# Patient Record
Sex: Female | Born: 1935 | Race: Black or African American | Hispanic: No | Smoking: Never smoker
Health system: Southern US, Community
[De-identification: ages and names within clinical notes are randomized; demographics above are authoritative.]

## PROBLEM LIST (undated history)

## (undated) DIAGNOSIS — E7800 Pure hypercholesterolemia, unspecified: Secondary | ICD-10-CM

## (undated) DIAGNOSIS — I10 Essential (primary) hypertension: Secondary | ICD-10-CM

## (undated) DIAGNOSIS — E119 Type 2 diabetes mellitus without complications: Secondary | ICD-10-CM

## (undated) DIAGNOSIS — I639 Cerebral infarction, unspecified: Secondary | ICD-10-CM

## (undated) HISTORY — DX: Type 2 diabetes mellitus without complications: E11.9

## (undated) HISTORY — PX: OTHER SURGICAL HISTORY: SHX169

## (undated) HISTORY — PX: ABDOMINAL HYSTERECTOMY: SHX81

## (undated) HISTORY — DX: Pure hypercholesterolemia, unspecified: E78.00

## (undated) HISTORY — DX: Cerebral infarction, unspecified: I63.9

## (undated) HISTORY — DX: Essential (primary) hypertension: I10

---

## 2000-03-08 ENCOUNTER — Emergency Department (HOSPITAL_COMMUNITY): Admission: EM | Admit: 2000-03-08 | Discharge: 2000-03-08 | Payer: Self-pay | Admitting: Emergency Medicine

## 2000-06-24 ENCOUNTER — Encounter: Admission: RE | Admit: 2000-06-24 | Discharge: 2000-06-24 | Payer: Self-pay | Admitting: Emergency Medicine

## 2000-06-24 ENCOUNTER — Encounter: Payer: Self-pay | Admitting: Emergency Medicine

## 2000-06-26 ENCOUNTER — Encounter: Admission: RE | Admit: 2000-06-26 | Discharge: 2000-06-26 | Payer: Self-pay | Admitting: Emergency Medicine

## 2000-06-26 ENCOUNTER — Encounter: Payer: Self-pay | Admitting: Emergency Medicine

## 2000-06-26 ENCOUNTER — Encounter (INDEPENDENT_AMBULATORY_CARE_PROVIDER_SITE_OTHER): Payer: Self-pay | Admitting: Specialist

## 2000-06-26 ENCOUNTER — Other Ambulatory Visit: Admission: RE | Admit: 2000-06-26 | Discharge: 2000-06-26 | Payer: Self-pay | Admitting: Emergency Medicine

## 2000-07-08 ENCOUNTER — Encounter: Payer: Self-pay | Admitting: General Surgery

## 2000-07-15 ENCOUNTER — Observation Stay (HOSPITAL_COMMUNITY): Admission: RE | Admit: 2000-07-15 | Discharge: 2000-07-16 | Payer: Self-pay | Admitting: General Surgery

## 2000-07-15 ENCOUNTER — Encounter (INDEPENDENT_AMBULATORY_CARE_PROVIDER_SITE_OTHER): Payer: Self-pay | Admitting: Specialist

## 2000-10-20 ENCOUNTER — Encounter: Admission: RE | Admit: 2000-10-20 | Discharge: 2000-10-20 | Payer: Self-pay | Admitting: Emergency Medicine

## 2000-10-20 ENCOUNTER — Encounter: Payer: Self-pay | Admitting: Emergency Medicine

## 2001-01-12 ENCOUNTER — Encounter: Payer: Self-pay | Admitting: Emergency Medicine

## 2001-01-12 ENCOUNTER — Encounter: Admission: RE | Admit: 2001-01-12 | Discharge: 2001-01-12 | Payer: Self-pay | Admitting: Emergency Medicine

## 2001-06-28 ENCOUNTER — Encounter: Admission: RE | Admit: 2001-06-28 | Discharge: 2001-06-28 | Payer: Self-pay | Admitting: Emergency Medicine

## 2001-06-28 ENCOUNTER — Encounter: Payer: Self-pay | Admitting: Emergency Medicine

## 2002-08-02 ENCOUNTER — Encounter: Payer: Self-pay | Admitting: Emergency Medicine

## 2002-08-02 ENCOUNTER — Encounter: Admission: RE | Admit: 2002-08-02 | Discharge: 2002-08-02 | Payer: Self-pay | Admitting: Emergency Medicine

## 2002-12-13 ENCOUNTER — Encounter: Admission: RE | Admit: 2002-12-13 | Discharge: 2002-12-13 | Payer: Self-pay | Admitting: Emergency Medicine

## 2002-12-13 ENCOUNTER — Encounter: Payer: Self-pay | Admitting: Emergency Medicine

## 2004-05-02 ENCOUNTER — Encounter: Admission: RE | Admit: 2004-05-02 | Discharge: 2004-05-02 | Payer: Self-pay | Admitting: Emergency Medicine

## 2004-08-15 ENCOUNTER — Encounter: Admission: RE | Admit: 2004-08-15 | Discharge: 2004-08-15 | Payer: Self-pay | Admitting: Emergency Medicine

## 2004-09-19 ENCOUNTER — Encounter: Admission: RE | Admit: 2004-09-19 | Discharge: 2004-10-16 | Payer: Self-pay | Admitting: Orthopedic Surgery

## 2005-04-03 ENCOUNTER — Ambulatory Visit: Payer: Self-pay | Admitting: Internal Medicine

## 2005-06-20 ENCOUNTER — Encounter: Admission: RE | Admit: 2005-06-20 | Discharge: 2005-06-20 | Payer: Self-pay | Admitting: Emergency Medicine

## 2006-07-13 ENCOUNTER — Encounter (INDEPENDENT_AMBULATORY_CARE_PROVIDER_SITE_OTHER): Payer: Self-pay | Admitting: Specialist

## 2006-07-13 ENCOUNTER — Ambulatory Visit (HOSPITAL_COMMUNITY): Admission: RE | Admit: 2006-07-13 | Discharge: 2006-07-13 | Payer: Self-pay | Admitting: Gastroenterology

## 2006-09-25 ENCOUNTER — Encounter: Admission: RE | Admit: 2006-09-25 | Discharge: 2006-09-25 | Payer: Self-pay | Admitting: Emergency Medicine

## 2007-11-04 ENCOUNTER — Encounter: Admission: RE | Admit: 2007-11-04 | Discharge: 2007-11-04 | Payer: Self-pay | Admitting: Internal Medicine

## 2010-10-22 ENCOUNTER — Other Ambulatory Visit: Payer: Self-pay | Admitting: Internal Medicine

## 2010-10-22 DIAGNOSIS — R1013 Epigastric pain: Secondary | ICD-10-CM

## 2010-10-22 DIAGNOSIS — R1011 Right upper quadrant pain: Secondary | ICD-10-CM

## 2010-10-24 ENCOUNTER — Ambulatory Visit
Admission: RE | Admit: 2010-10-24 | Discharge: 2010-10-24 | Disposition: A | Discharge status: Home / Self Care | Payer: Medicare PPO | Source: Ambulatory Visit | Attending: Internal Medicine | Admitting: Internal Medicine

## 2010-10-24 DIAGNOSIS — R1013 Epigastric pain: Secondary | ICD-10-CM

## 2010-10-24 DIAGNOSIS — R1011 Right upper quadrant pain: Secondary | ICD-10-CM

## 2010-11-01 NOTE — Op Note (Signed)
NAMEJACIANA, CLEMON            ACCOUNT NO.:  [id number]   MEDICAL RECORD NO.:  [id number]          PATIENT TYPE:  AMB   LOCATION:  ENDO                         FACILITY:  [facility]   PHYSICIAN:  Nelwyn Salisbury, M.D.  DATE OF BIRTH:  1931/09/03   DATE OF PROCEDURE:  07/13/2006  DATE OF DISCHARGE:                               OPERATIVE REPORT   PROCEDURE PERFORMED:  Colonoscopy with cold biopsies x2.   ENDOSCOPIST:  Nelwyn Salisbury, M.D.   INSTRUMENT USED:  Pentax video colonoscope.   INDICATIONS FOR PROCEDURE:  A 77 year old African-American female with  personal history of breast cancer diagnosed and treated in 2004 and a  history of rectal bleeding, undergoing colonoscopy to rule out colonic  polyps, masses, etc.   PREPROCEDURE PREPARATION:  Informed consent was procured from the  patient.  The patient was fasted for four hours prior to the procedure  and prepped with 20 Osmoprep pills the night of and 12 Osmoprep pills  the morning of the procedure.  The risks and benefits of the procedure  including a 10% miss rate for cancer or polyps was discussed with the  patient as well.   PREPROCEDURE PHYSICAL:  The patient had stable vital signs.  Neck  supple.  Chest clear to auscultation.  S1 and S2 regular.  Abdomen soft  with normal bowel sounds.   DESCRIPTION OF PROCEDURE:  The patient was placed in left lateral  decubitus position and sedated with 75 mcg of fentanyl and 7.5 mg of  Versed in slow incremental doses. Once the patient was adequately  sedated and maintained on low flow oxygen and continuous cardiac  monitoring, the Olympus video colonoscope was advanced from the rectum  to the cecum.  The appendicular orifice and ileocecal valve were clearly  visualized and photographed.  Multiple washes were done. A small sessile  polyp was biopsied from the cecum and one from the midright colon (cold  biopsies x2).  The appendicular orifice and ileocecal valve were clearly  visualized and photographed.  The terminal ileum appeared normal and  without lesions.  Retroflexion in the rectum revealed prominent internal  hemorrhoids.  The patient tolerated the procedure well without immediate  complications.   IMPRESSION:  1. Small sessile polyp biopsied from the cecum and the right colon.  2. Prominent internal hemorrhoids.  3. No evidence of diverticulosis.  4. Normal terminal ileum.   RECOMMENDATIONS:  1. Await pathology results.  2. Repeat colonoscopy depending on pathology results.  3. Avoid all nonsteroidals including aspirin for the next two weeks.  4. Outpatient followup as need arises in the future.      Nelwyn Salisbury, M.D.  Electronically Signed     JNM/MEDQ  D:  07/13/2006  T:  07/13/2006  Job:  [id number]   cc:   Harden Mo, M.D.  Rudell Cobb. Annamaria Boots, M.D.

## 2010-11-01 NOTE — Op Note (Signed)
[facility]  Patient:    Shannon Mcconnell, Shannon Mcconnell                     MRN: [id number] Proc. Date: 07/15/00 Adm. Date:  [id number] Attending:  Earl Gala CC:         Harden Mo, M.D.   Operative Report  PREOPERATIVE DIAGNOSIS:  Intraductal carcinoma of the left breast.  POSTOPERATIVE DIAGNOSIS:  Intraductal carcinoma of the left breast.  OPERATION PERFORMED:  Left total mastectomy.  SURGEON:  Dr. Marylene Buerger.  ASSISTANT:  Dr. Magdalene River.  ANESTHESIA:  General.  DESCRIPTION OF PROCEDURE:  This 77 year old female had an abnormal mammogram with microcalcifications occupying the inferior portion of her left breast. 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 She was not interested in reconstruction.  After suitable general anesthesia was induced, the patient was placed in the supine position with the left arm extended down the arm board. The left breast was then prepped and draped in the usual fashion. I marked the medial and the lateral portions of the breast and then by pulling the breast down made a straight line between these points and then by pulling the breast up made a straight line between the points which would give [place] our incisions and not leave dog ears.  Both incisions were then made and we dissected the superior flap, using the cautery, to the clavicle superiorly and then to the sternum medially and to the costocartilage inferiorly near at the abdominal muscles and then laterally to the latissimus dorsi muscle. Following the completion of the flap dissection with good hemostasis, we then removed the breast by dissecting from medial to lateral and removed it from the specimen. This gave [place] a total mastectomy without removing any of the axilla. Hemostasis was obtained with the cautery. We thoroughly irrigated the  field with saline. The 6 [place] Blake drain was inserted and brought out through a separate stab wound. The skin was stapled. Dressings applied. The patient was transferred to the recovery room in satisfactory condition having tolerated the procedure well. DD:  07/15/00 TD:  07/15/00 Job: [id number]

## 2011-02-08 ENCOUNTER — Ambulatory Visit (INDEPENDENT_AMBULATORY_CARE_PROVIDER_SITE_OTHER): Payer: Medicare PPO

## 2011-02-08 ENCOUNTER — Inpatient Hospital Stay (INDEPENDENT_AMBULATORY_CARE_PROVIDER_SITE_OTHER)
Admission: RE | Admit: 2011-02-08 | Discharge: 2011-02-08 | Disposition: A | Discharge status: Home / Self Care | Payer: Medicare PPO | Source: Ambulatory Visit | Attending: Emergency Medicine | Admitting: Emergency Medicine

## 2011-02-08 DIAGNOSIS — M25469 Effusion, unspecified knee: Secondary | ICD-10-CM

## 2011-02-08 LAB — SYNOVIAL CELL COUNT + DIFF, W/ CRYSTALS
Crystals, Fluid: NONE SEEN
Monocyte-Macrophage-Synovial Fluid: 8 % — ABNORMAL LOW (ref 50–90)
WBC, Synovial: 46915 /mm3 — ABNORMAL HIGH (ref 0–200)

## 2011-02-12 LAB — BODY FLUID CULTURE: Culture: NO GROWTH

## 2011-10-03 ENCOUNTER — Other Ambulatory Visit: Payer: Self-pay | Admitting: Internal Medicine

## 2011-10-03 DIAGNOSIS — Z1231 Encounter for screening mammogram for malignant neoplasm of breast: Secondary | ICD-10-CM

## 2011-10-03 DIAGNOSIS — Z9012 Acquired absence of left breast and nipple: Secondary | ICD-10-CM

## 2011-10-14 ENCOUNTER — Ambulatory Visit
Admission: RE | Admit: 2011-10-14 | Discharge: 2011-10-14 | Disposition: A | Discharge status: Home / Self Care | Payer: Medicare PPO | Source: Ambulatory Visit | Attending: Internal Medicine | Admitting: Internal Medicine

## 2011-10-14 DIAGNOSIS — Z1231 Encounter for screening mammogram for malignant neoplasm of breast: Secondary | ICD-10-CM

## 2011-10-14 DIAGNOSIS — Z9012 Acquired absence of left breast and nipple: Secondary | ICD-10-CM

## 2013-03-01 ENCOUNTER — Other Ambulatory Visit: Payer: Self-pay

## 2013-03-01 DIAGNOSIS — Z9012 Acquired absence of left breast and nipple: Secondary | ICD-10-CM

## 2013-03-01 DIAGNOSIS — Z1231 Encounter for screening mammogram for malignant neoplasm of breast: Secondary | ICD-10-CM

## 2013-03-24 ENCOUNTER — Ambulatory Visit
Admission: RE | Admit: 2013-03-24 | Discharge: 2013-03-24 | Disposition: A | Discharge status: Home / Self Care | Payer: Medicare PPO | Source: Ambulatory Visit

## 2013-03-24 DIAGNOSIS — Z9012 Acquired absence of left breast and nipple: Secondary | ICD-10-CM

## 2013-03-24 DIAGNOSIS — Z1231 Encounter for screening mammogram for malignant neoplasm of breast: Secondary | ICD-10-CM

## 2014-11-23 DIAGNOSIS — Z0000 Encounter for general adult medical examination without abnormal findings: Secondary | ICD-10-CM | POA: Diagnosis not present

## 2014-11-23 DIAGNOSIS — I130 Hypertensive heart and chronic kidney disease with heart failure and stage 1 through stage 4 chronic kidney disease, or unspecified chronic kidney disease: Secondary | ICD-10-CM | POA: Diagnosis not present

## 2014-11-23 DIAGNOSIS — R413 Other amnesia: Secondary | ICD-10-CM | POA: Diagnosis not present

## 2014-11-23 DIAGNOSIS — E1122 Type 2 diabetes mellitus with diabetic chronic kidney disease: Secondary | ICD-10-CM | POA: Diagnosis not present

## 2014-11-23 DIAGNOSIS — N08 Glomerular disorders in diseases classified elsewhere: Secondary | ICD-10-CM | POA: Diagnosis not present

## 2014-11-23 DIAGNOSIS — N183 Chronic kidney disease, stage 3 (moderate): Secondary | ICD-10-CM | POA: Diagnosis not present

## 2014-12-02 DIAGNOSIS — H04123 Dry eye syndrome of bilateral lacrimal glands: Secondary | ICD-10-CM | POA: Diagnosis not present

## 2014-12-02 DIAGNOSIS — H40033 Anatomical narrow angle, bilateral: Secondary | ICD-10-CM | POA: Diagnosis not present

## 2014-12-05 DIAGNOSIS — I270 Primary pulmonary hypertension: Secondary | ICD-10-CM | POA: Diagnosis not present

## 2014-12-27 DIAGNOSIS — M05741 Rheumatoid arthritis with rheumatoid factor of right hand without organ or systems involvement: Secondary | ICD-10-CM | POA: Diagnosis not present

## 2014-12-27 DIAGNOSIS — R9431 Abnormal electrocardiogram [ECG] [EKG]: Secondary | ICD-10-CM | POA: Diagnosis not present

## 2014-12-27 DIAGNOSIS — I270 Primary pulmonary hypertension: Secondary | ICD-10-CM | POA: Diagnosis not present

## 2014-12-27 DIAGNOSIS — M05742 Rheumatoid arthritis with rheumatoid factor of left hand without organ or systems involvement: Secondary | ICD-10-CM | POA: Diagnosis not present

## 2015-04-05 DIAGNOSIS — N951 Menopausal and female climacteric states: Secondary | ICD-10-CM | POA: Diagnosis not present

## 2015-04-05 DIAGNOSIS — Z23 Encounter for immunization: Secondary | ICD-10-CM | POA: Diagnosis not present

## 2015-04-05 DIAGNOSIS — Z13820 Encounter for screening for osteoporosis: Secondary | ICD-10-CM | POA: Diagnosis not present

## 2015-04-05 DIAGNOSIS — E1122 Type 2 diabetes mellitus with diabetic chronic kidney disease: Secondary | ICD-10-CM | POA: Diagnosis not present

## 2015-04-05 DIAGNOSIS — N08 Glomerular disorders in diseases classified elsewhere: Secondary | ICD-10-CM | POA: Diagnosis not present

## 2015-04-05 DIAGNOSIS — I129 Hypertensive chronic kidney disease with stage 1 through stage 4 chronic kidney disease, or unspecified chronic kidney disease: Secondary | ICD-10-CM | POA: Diagnosis not present

## 2015-04-05 DIAGNOSIS — N183 Chronic kidney disease, stage 3 (moderate): Secondary | ICD-10-CM | POA: Diagnosis not present

## 2015-08-09 DIAGNOSIS — N08 Glomerular disorders in diseases classified elsewhere: Secondary | ICD-10-CM | POA: Diagnosis not present

## 2015-08-09 DIAGNOSIS — I1310 Hypertensive heart and chronic kidney disease without heart failure, with stage 1 through stage 4 chronic kidney disease, or unspecified chronic kidney disease: Secondary | ICD-10-CM | POA: Diagnosis not present

## 2015-08-09 DIAGNOSIS — N390 Urinary tract infection, site not specified: Secondary | ICD-10-CM | POA: Diagnosis not present

## 2015-08-09 DIAGNOSIS — E1122 Type 2 diabetes mellitus with diabetic chronic kidney disease: Secondary | ICD-10-CM | POA: Diagnosis not present

## 2015-08-09 DIAGNOSIS — I5032 Chronic diastolic (congestive) heart failure: Secondary | ICD-10-CM | POA: Diagnosis not present

## 2015-08-09 DIAGNOSIS — N183 Chronic kidney disease, stage 3 (moderate): Secondary | ICD-10-CM | POA: Diagnosis not present

## 2015-10-03 DIAGNOSIS — M25561 Pain in right knee: Secondary | ICD-10-CM | POA: Diagnosis not present

## 2015-10-03 DIAGNOSIS — M1711 Unilateral primary osteoarthritis, right knee: Secondary | ICD-10-CM | POA: Diagnosis not present

## 2015-10-22 DIAGNOSIS — M1711 Unilateral primary osteoarthritis, right knee: Secondary | ICD-10-CM | POA: Diagnosis not present

## 2015-10-22 DIAGNOSIS — M25461 Effusion, right knee: Secondary | ICD-10-CM | POA: Diagnosis not present

## 2015-10-24 DIAGNOSIS — N08 Glomerular disorders in diseases classified elsewhere: Secondary | ICD-10-CM | POA: Diagnosis not present

## 2015-10-24 DIAGNOSIS — E1122 Type 2 diabetes mellitus with diabetic chronic kidney disease: Secondary | ICD-10-CM | POA: Diagnosis not present

## 2015-10-24 DIAGNOSIS — I130 Hypertensive heart and chronic kidney disease with heart failure and stage 1 through stage 4 chronic kidney disease, or unspecified chronic kidney disease: Secondary | ICD-10-CM | POA: Diagnosis not present

## 2015-10-24 DIAGNOSIS — N183 Chronic kidney disease, stage 3 (moderate): Secondary | ICD-10-CM | POA: Diagnosis not present

## 2015-12-01 DIAGNOSIS — E119 Type 2 diabetes mellitus without complications: Secondary | ICD-10-CM | POA: Diagnosis not present

## 2015-12-01 DIAGNOSIS — H40033 Anatomical narrow angle, bilateral: Secondary | ICD-10-CM | POA: Diagnosis not present

## 2016-01-16 DIAGNOSIS — I130 Hypertensive heart and chronic kidney disease with heart failure and stage 1 through stage 4 chronic kidney disease, or unspecified chronic kidney disease: Secondary | ICD-10-CM | POA: Diagnosis not present

## 2016-01-16 DIAGNOSIS — N183 Chronic kidney disease, stage 3 (moderate): Secondary | ICD-10-CM | POA: Diagnosis not present

## 2016-01-16 DIAGNOSIS — I5030 Unspecified diastolic (congestive) heart failure: Secondary | ICD-10-CM | POA: Diagnosis not present

## 2016-01-16 DIAGNOSIS — N08 Glomerular disorders in diseases classified elsewhere: Secondary | ICD-10-CM | POA: Diagnosis not present

## 2016-01-16 DIAGNOSIS — Z0000 Encounter for general adult medical examination without abnormal findings: Secondary | ICD-10-CM | POA: Diagnosis not present

## 2016-01-16 DIAGNOSIS — E559 Vitamin D deficiency, unspecified: Secondary | ICD-10-CM | POA: Diagnosis not present

## 2016-01-16 DIAGNOSIS — E1122 Type 2 diabetes mellitus with diabetic chronic kidney disease: Secondary | ICD-10-CM | POA: Diagnosis not present

## 2016-05-21 DIAGNOSIS — E1122 Type 2 diabetes mellitus with diabetic chronic kidney disease: Secondary | ICD-10-CM | POA: Diagnosis not present

## 2016-05-21 DIAGNOSIS — I130 Hypertensive heart and chronic kidney disease with heart failure and stage 1 through stage 4 chronic kidney disease, or unspecified chronic kidney disease: Secondary | ICD-10-CM | POA: Diagnosis not present

## 2016-05-21 DIAGNOSIS — N183 Chronic kidney disease, stage 3 (moderate): Secondary | ICD-10-CM | POA: Diagnosis not present

## 2016-05-21 DIAGNOSIS — N08 Glomerular disorders in diseases classified elsewhere: Secondary | ICD-10-CM | POA: Diagnosis not present

## 2016-05-21 DIAGNOSIS — I5032 Chronic diastolic (congestive) heart failure: Secondary | ICD-10-CM | POA: Diagnosis not present

## 2017-01-17 DIAGNOSIS — H40033 Anatomical narrow angle, bilateral: Secondary | ICD-10-CM | POA: Diagnosis not present

## 2017-01-17 DIAGNOSIS — E119 Type 2 diabetes mellitus without complications: Secondary | ICD-10-CM | POA: Diagnosis not present

## 2017-01-27 DIAGNOSIS — E1122 Type 2 diabetes mellitus with diabetic chronic kidney disease: Secondary | ICD-10-CM | POA: Diagnosis not present

## 2017-01-27 DIAGNOSIS — N08 Glomerular disorders in diseases classified elsewhere: Secondary | ICD-10-CM | POA: Diagnosis not present

## 2017-01-27 DIAGNOSIS — N183 Chronic kidney disease, stage 3 (moderate): Secondary | ICD-10-CM | POA: Diagnosis not present

## 2017-01-27 DIAGNOSIS — I130 Hypertensive heart and chronic kidney disease with heart failure and stage 1 through stage 4 chronic kidney disease, or unspecified chronic kidney disease: Secondary | ICD-10-CM | POA: Diagnosis not present

## 2017-01-27 DIAGNOSIS — E559 Vitamin D deficiency, unspecified: Secondary | ICD-10-CM | POA: Diagnosis not present

## 2017-03-19 DIAGNOSIS — Z0000 Encounter for general adult medical examination without abnormal findings: Secondary | ICD-10-CM | POA: Diagnosis not present

## 2017-03-19 DIAGNOSIS — I129 Hypertensive chronic kidney disease with stage 1 through stage 4 chronic kidney disease, or unspecified chronic kidney disease: Secondary | ICD-10-CM | POA: Diagnosis not present

## 2017-03-19 DIAGNOSIS — N08 Glomerular disorders in diseases classified elsewhere: Secondary | ICD-10-CM | POA: Diagnosis not present

## 2017-03-19 DIAGNOSIS — E1122 Type 2 diabetes mellitus with diabetic chronic kidney disease: Secondary | ICD-10-CM | POA: Diagnosis not present

## 2017-03-19 DIAGNOSIS — Z23 Encounter for immunization: Secondary | ICD-10-CM | POA: Diagnosis not present

## 2017-03-19 DIAGNOSIS — N183 Chronic kidney disease, stage 3 (moderate): Secondary | ICD-10-CM | POA: Diagnosis not present

## 2017-07-28 DIAGNOSIS — N183 Chronic kidney disease, stage 3 (moderate): Secondary | ICD-10-CM | POA: Diagnosis not present

## 2017-07-28 DIAGNOSIS — I129 Hypertensive chronic kidney disease with stage 1 through stage 4 chronic kidney disease, or unspecified chronic kidney disease: Secondary | ICD-10-CM | POA: Diagnosis not present

## 2017-07-28 DIAGNOSIS — N08 Glomerular disorders in diseases classified elsewhere: Secondary | ICD-10-CM | POA: Diagnosis not present

## 2017-07-28 DIAGNOSIS — E1122 Type 2 diabetes mellitus with diabetic chronic kidney disease: Secondary | ICD-10-CM | POA: Diagnosis not present

## 2017-10-27 DIAGNOSIS — I129 Hypertensive chronic kidney disease with stage 1 through stage 4 chronic kidney disease, or unspecified chronic kidney disease: Secondary | ICD-10-CM | POA: Diagnosis not present

## 2017-10-27 DIAGNOSIS — R1013 Epigastric pain: Secondary | ICD-10-CM | POA: Diagnosis not present

## 2017-10-27 DIAGNOSIS — R350 Frequency of micturition: Secondary | ICD-10-CM | POA: Diagnosis not present

## 2017-10-27 DIAGNOSIS — N183 Chronic kidney disease, stage 3 (moderate): Secondary | ICD-10-CM | POA: Diagnosis not present

## 2017-11-24 DIAGNOSIS — N183 Chronic kidney disease, stage 3 (moderate): Secondary | ICD-10-CM | POA: Diagnosis not present

## 2017-11-24 DIAGNOSIS — I129 Hypertensive chronic kidney disease with stage 1 through stage 4 chronic kidney disease, or unspecified chronic kidney disease: Secondary | ICD-10-CM | POA: Diagnosis not present

## 2017-11-24 DIAGNOSIS — K219 Gastro-esophageal reflux disease without esophagitis: Secondary | ICD-10-CM | POA: Diagnosis not present

## 2017-11-24 DIAGNOSIS — Z1389 Encounter for screening for other disorder: Secondary | ICD-10-CM | POA: Diagnosis not present

## 2018-01-05 DIAGNOSIS — N08 Glomerular disorders in diseases classified elsewhere: Secondary | ICD-10-CM | POA: Diagnosis not present

## 2018-01-05 DIAGNOSIS — E1122 Type 2 diabetes mellitus with diabetic chronic kidney disease: Secondary | ICD-10-CM | POA: Diagnosis not present

## 2018-01-05 DIAGNOSIS — R252 Cramp and spasm: Secondary | ICD-10-CM | POA: Diagnosis not present

## 2018-01-05 DIAGNOSIS — N183 Chronic kidney disease, stage 3 (moderate): Secondary | ICD-10-CM | POA: Diagnosis not present

## 2018-01-05 DIAGNOSIS — I129 Hypertensive chronic kidney disease with stage 1 through stage 4 chronic kidney disease, or unspecified chronic kidney disease: Secondary | ICD-10-CM | POA: Diagnosis not present

## 2018-01-05 LAB — BASIC METABOLIC PANEL
BUN: 23 — AB (ref 4–21)
Creatinine: 1.5 — AB (ref 0.5–1.1)
Potassium: 3.7 (ref 3.4–5.3)
Sodium: 136 — AB (ref 137–147)

## 2018-01-05 LAB — LIPID PANEL
CHOLESTEROL: 187 (ref 0–200)
HDL: 74 — AB (ref 35–70)
LDL Cholesterol: 100
LDl/HDL Ratio: 1.4

## 2018-01-05 LAB — HEMOGLOBIN A1C: Hemoglobin A1C: 5.8

## 2018-01-05 LAB — HEPATIC FUNCTION PANEL
ALT: 5 — AB (ref 7–35)
AST: 18 (ref 13–35)

## 2018-01-23 DIAGNOSIS — E119 Type 2 diabetes mellitus without complications: Secondary | ICD-10-CM | POA: Diagnosis not present

## 2018-01-23 DIAGNOSIS — H40033 Anatomical narrow angle, bilateral: Secondary | ICD-10-CM | POA: Diagnosis not present

## 2018-03-06 ENCOUNTER — Encounter: Payer: Self-pay | Admitting: Internal Medicine

## 2018-03-06 DIAGNOSIS — N183 Chronic kidney disease, stage 3 unspecified: Secondary | ICD-10-CM | POA: Insufficient documentation

## 2018-03-06 DIAGNOSIS — N289 Disorder of kidney and ureter, unspecified: Secondary | ICD-10-CM

## 2018-03-06 DIAGNOSIS — I1310 Hypertensive heart and chronic kidney disease without heart failure, with stage 1 through stage 4 chronic kidney disease, or unspecified chronic kidney disease: Secondary | ICD-10-CM | POA: Insufficient documentation

## 2018-03-06 DIAGNOSIS — E1122 Type 2 diabetes mellitus with diabetic chronic kidney disease: Secondary | ICD-10-CM | POA: Insufficient documentation

## 2018-03-06 DIAGNOSIS — I129 Hypertensive chronic kidney disease with stage 1 through stage 4 chronic kidney disease, or unspecified chronic kidney disease: Secondary | ICD-10-CM | POA: Insufficient documentation

## 2018-03-31 ENCOUNTER — Ambulatory Visit: Payer: Self-pay

## 2018-03-31 ENCOUNTER — Ambulatory Visit: Payer: Self-pay | Admitting: Internal Medicine

## 2018-04-01 ENCOUNTER — Other Ambulatory Visit: Payer: Self-pay | Admitting: Internal Medicine

## 2018-04-06 ENCOUNTER — Other Ambulatory Visit: Payer: Self-pay

## 2018-04-06 ENCOUNTER — Telehealth: Payer: Self-pay | Admitting: Internal Medicine

## 2018-04-06 MED ORDER — AMLODIPINE BESYLATE 10 MG PO TABS: 10.0000 mg | ORAL_TABLET | Freq: Every day | ORAL | 1 refills | Status: DC

## 2018-04-06 NOTE — Telephone Encounter (Signed)
I called twice to try to reschedule AWV that was cancelled on 03/31/18 but was unable to leave a message.

## 2018-04-20 ENCOUNTER — Other Ambulatory Visit: Payer: Self-pay | Admitting: Nurse Practitioner

## 2018-04-22 ENCOUNTER — Other Ambulatory Visit: Payer: Self-pay | Admitting: Nurse Practitioner

## 2018-04-27 ENCOUNTER — Telehealth: Payer: Self-pay | Admitting: Internal Medicine

## 2018-04-27 NOTE — Telephone Encounter (Signed)
I tried calling the pt to reschedule her AWV appt that was cancelled in October.  There was no answer and no option to leave a message. VDM (DD)

## 2018-05-26 ENCOUNTER — Encounter: Payer: Self-pay | Admitting: Internal Medicine

## 2018-05-26 ENCOUNTER — Ambulatory Visit: Payer: Medicare PPO | Admitting: Internal Medicine

## 2018-05-26 VITALS — BP 138/72 | HR 89 | Temp 97.7°F | Ht 65.0 in | Wt 153.8 lb

## 2018-05-26 DIAGNOSIS — Z1212 Encounter for screening for malignant neoplasm of rectum: Secondary | ICD-10-CM

## 2018-05-26 DIAGNOSIS — N060 Isolated proteinuria with minor glomerular abnormality: Secondary | ICD-10-CM

## 2018-05-26 DIAGNOSIS — K921 Melena: Secondary | ICD-10-CM | POA: Diagnosis not present

## 2018-05-26 DIAGNOSIS — Z0000 Encounter for general adult medical examination without abnormal findings: Secondary | ICD-10-CM | POA: Diagnosis not present

## 2018-05-26 DIAGNOSIS — R7309 Other abnormal glucose: Secondary | ICD-10-CM | POA: Diagnosis not present

## 2018-05-26 DIAGNOSIS — I10 Essential (primary) hypertension: Secondary | ICD-10-CM | POA: Diagnosis not present

## 2018-05-26 DIAGNOSIS — Z23 Encounter for immunization: Secondary | ICD-10-CM

## 2018-05-26 DIAGNOSIS — E2839 Other primary ovarian failure: Secondary | ICD-10-CM

## 2018-05-26 LAB — POCT URINALYSIS DIPSTICK
Bilirubin, UA: NEGATIVE
Blood, UA: NEGATIVE
Glucose, UA: NEGATIVE
Ketones, UA: NEGATIVE
Nitrite, UA: NEGATIVE
Protein, UA: POSITIVE — AB
Spec Grav, UA: 1.015
Urobilinogen, UA: 0.2 U/dL
pH, UA: 6

## 2018-05-26 LAB — POCT UA - MICROALBUMIN
Creatinine, POC: 300 mg/dL
Microalbumin Ur, POC: 150 mg/L

## 2018-05-26 LAB — POC HEMOCCULT BLD/STL (OFFICE/1-CARD/DIAGNOSTIC): FECAL OCCULT BLD: POSITIVE — AB

## 2018-05-26 NOTE — Patient Instructions (Signed)
[name] , Thank you for taking time to come for your Medicare Wellness Visit. I appreciate your ongoing commitment to your health goals. Please review the following plan we discussed and let me know if I can assist you in the future.   These are the goals we discussed: Goals   None     This is a list of the screening recommended for you and due dates:  Health Maintenance  Topic Date Due  . Complete foot exam   06/27/1943  . Eye exam for diabetics  06/27/1943  . Tetanus Vaccine  06/26/1952  . DEXA scan (bone density measurement)  06/26/1998  . Pneumonia vaccines (1 of 2 - PCV13) 06/26/1998  . Flu Shot  01/14/2018  . Hemoglobin A1C  07/08/2018  Preventive Care 65 Years and Older, Female Preventive care refers to lifestyle choices and visits with your health care provider that can promote health and wellness. What does preventive care include?  A yearly physical exam. This is also called an annual well check.  Dental exams once or twice a year.  Routine eye exams. Ask your health care provider how often you should have your eyes checked.  Personal lifestyle choices, including: ? Daily care of your teeth and gums. ? Regular physical activity. ? Eating a healthy diet. ? Avoiding tobacco and drug use. ? Limiting alcohol use. ? Practicing safe sex. ? Taking low-dose aspirin every day. ? Taking vitamin and mineral supplements as recommended by your health care provider. What happens during an annual well check? The services and screenings done by your health care provider during your annual well check will depend on your age, overall health, lifestyle risk factors, and family history of disease. Counseling Your health care provider may ask you questions about your:  Alcohol use.  Tobacco use.  Drug use.  Emotional well-being.  Home and relationship well-being.  Sexual activity.  Eating habits.  History of falls.  Memory and ability to understand  (cognition).  Work and work Statistician.  Reproductive health.  Screening You may have the following tests or measurements:  Height, weight, and BMI.  Blood pressure.  Lipid and cholesterol levels. These may be checked every 5 years, or more frequently if you are over 7 years old.  Skin check.  Lung cancer screening. You may have this screening every year starting at age 86 if you have a 30-pack-year history of smoking and currently smoke or have quit within the past 15 years.  Fecal occult blood test (FOBT) of the stool. You may have this test every year starting at age 73.  Flexible sigmoidoscopy or colonoscopy. You may have a sigmoidoscopy every 5 years or a colonoscopy every 10 years starting at age 73.  Hepatitis C blood test.  Hepatitis B blood test.  Sexually transmitted disease (STD) testing.  Diabetes screening. This is done by checking your blood sugar (glucose) after you have not eaten for a while (fasting). You may have this done every 1-3 years.  Bone density scan. This is done to screen for osteoporosis. You may have this done starting at age 68.  Mammogram. This may be done every 1-2 years. Talk to your health care provider about how often you should have regular mammograms.  Talk with your health care provider about your test results, treatment options, and if necessary, the need for more tests. Vaccines Your health care provider may recommend certain vaccines, such as:  Influenza vaccine. This is recommended every year.  Tetanus, diphtheria, and  acellular pertussis (Tdap, Td) vaccine. You may need a Td booster every 10 years.  Varicella vaccine. You may need this if you have not been vaccinated.  Zoster vaccine. You may need this after age 47.  Measles, mumps, and rubella (MMR) vaccine. You may need at least one dose of MMR if you were born in 1957 or later. You may also need a second dose.  Pneumococcal 13-valent conjugate (PCV13) vaccine. One dose  is recommended after age 67.  Pneumococcal polysaccharide (PPSV23) vaccine. One dose is recommended after age 2.  Meningococcal vaccine. You may need this if you have certain conditions.  Hepatitis A vaccine. You may need this if you have certain conditions or if you travel or work in places where you may be exposed to hepatitis A.  Hepatitis B vaccine. You may need this if you have certain conditions or if you travel or work in places where you may be exposed to hepatitis B.  Haemophilus influenzae type b (Hib) vaccine. You may need this if you have certain conditions.  Talk to your health care provider about which screenings and vaccines you need and how often you need them. This information is not intended to replace advice given to you by your health care provider. Make sure you discuss any questions you have with your health care provider. Document Released: 06/29/2015 Document Revised: 02/20/2016 Document Reviewed: 04/03/2015 Elsevier Interactive Patient Education  [employer].

## 2018-05-26 NOTE — Progress Notes (Signed)
Subjective:     Patient ID: Shannon Mcconnell , female    DOB: [birth year] , 84 y.o.   MRN: [id number]   Chief Complaint  Patient presents with  . Medicare Wellness    HPI Here for physical and Medicare wellness.  Had he eye xm this year. Has dentures, so does not get dental checks. Does not have any complaints.    Past Medical History:  Diagnosis Date  . Hypertension      Family History  Problem Relation Age of Onset  . Hypertension Mother   . Hypertension Father      Current Outpatient Medications:  .  amLODipine (NORVASC) 10 MG tablet, Take 1 tablet (10 mg total) by mouth daily., Disp: 90 tablet, Rfl: 1 .  hydrochlorothiazide (HYDRODIURIL) 25 MG tablet, TAKE 1 TABLET BY MOUTH ONCE DAILY, Disp: 90 tablet, Rfl: 0 .  olmesartan (BENICAR) 40 MG tablet, Take 40 mg by mouth daily., Disp: , Rfl:  .  pravastatin (PRAVACHOL) 20 MG tablet, Take 20 mg by mouth daily., Disp: , Rfl:  .  TRUE METRIX BLOOD GLUCOSE TEST test strip, USE AS DIRECTED TO CHECK BLOOD SUGAR TWICE DAILY, Disp: , Rfl: 99   Not on File   Review of Systems  Constitutional: Negative.   HENT: Negative.   Eyes: Negative.   Respiratory: Negative.   Gastrointestinal: Negative.   Endocrine: Negative.   Genitourinary: Negative for decreased urine volume, difficulty urinating, dysuria, enuresis, frequency, hematuria and urgency.  Musculoskeletal: Positive for arthralgias. Negative for back pain, gait problem, joint swelling, myalgias, neck pain and neck stiffness.       All over which is chronic from her arthritis  Skin: Negative.   Allergic/Immunologic: Negative.   Neurological: Negative.   Hematological: Negative for adenopathy. Does not bruise/bleed easily.  Psychiatric/Behavioral: Negative for confusion and suicidal ideas. The patient is not nervous/anxious.      Today's Vitals   05/26/18 1213  BP: 138/72  Pulse: 89  Temp: 97.7 F (36.5 C)  TempSrc: Oral  SpO2: 98%  Weight: 153 lb 12.8 oz (69.8 kg)   Height: 5\' 5"  (1.651 m)   Body mass index is 25.59 kg/m.   Objective:  Physical Exam  BP 138/72 (BP Location: Left Arm, Patient Position: Sitting, Cuff Size: Normal)   Pulse 89   Temp 97.7 F (36.5 C) (Oral)   Ht 5\' 5"  (1.651 m)   Wt 153 lb 12.8 oz (69.8 kg)   SpO2 98%   BMI 25.59 kg/m   General Appearance:    Alert, cooperative, no distress, appears stated age  Head:    Normocephalic, without obvious abnormality, atraumatic  Eyes:    PERRL, conjunctiva/corneas clear, EOM's intact, fundi    benign, both eyes  Ears:    Normal TM's and external ear canals, both ears  Nose:   Nares normal, septum midline, mucosa normal, no drainage    or sinus tenderness  Throat:   Lips, mucosa, and tongue normal; teeth and gums normal  Neck:   Supple, symmetrical, trachea midline, no adenopathy;    thyroid:  no enlargement/tenderness/nodules; no carotid   bruit   Back:     Symmetric, no curvature, ROM normal, no CVA tenderness  Lungs:     Clear to auscultation bilaterally, respirations unlabored  Chest Wall:    No tenderness or deformity   Heart:    Regular rate and rhythm, S1 and S2 normal, no murmur, rub   or gallop  Breast Exam:  No tenderness, masses, or nipple abnormality of R, does not have L breast  Abdomen:     Soft, non-tender, bowel sounds active all four quadrants,    no masses, no organomegaly     Rectal:    Normal tone, normal prostate, no masses or tenderness;   guaiac positive stool  Extremities:   Extremities normal, atraumatic, no cyanosis or edema. Has multiple varicose veins on lower legs bilaterally.   Pulses:   2+ and symmetric all extremities  Skin:   Skin color, texture, turgor normal, no rashes or lesions  Lymph nodes:   Cervical, supraclavicular, and axillary nodes normal  Neurologic:   CNII-XII intact, normal strength, sensation and has hyporeflexia all over which she states this is normal for her. Has normal Rhomberg, tandem gait, heel walk and tip toe gait.       EKG compared to the one from 03/2017 unchanged.   Assessment And Plan:    1. Blood in stool - Ambulatory referral to Gastroenterology - CBC no Diff  2. General medical exam- routine. FU 1 y - POC Hemoccult Bld/Stl (1-Cd Office Dx)  3. Essential hypertension- stable. May continue same meds - POCT Urinalysis Dipstick (69629)- shows + proteinuria - POCT UA - Microalbumin - EKG 12-Lead - CMP14 + Anion Gap - CBC no Diff  4. Abnormal glucose-screen  - Hemoglobin A1c  5. Isolated proteinuria with minor glomerular abnormality-  I will see what her creatinine looks like when CMP comes back.     Kashius Dominic RODRIGUEZ-SOUTHWORTH, PA-C    Subjective:   Shannon Mcconnell is a 84 y.o. female who presents for Medicare Annual (Subsequent) preventive examination.   Objective     Vitals: BP 138/72 (BP Location: Left Arm, Patient Position: Sitting, Cuff Size: Normal)   Pulse 89   Temp 97.7 F (36.5 C) (Oral)   Ht 5\' 5"  (1.651 m)   Wt 153 lb 12.8 oz (69.8 kg)   SpO2 98%   BMI 25.59 kg/m   Body mass index is 25.59 kg/m.  Advanced Directives 05/26/2018  Does Patient Have a Medical Advance Directive? No    Tobacco Social History   Tobacco Use  Smoking Status Never Smoker  Smokeless Tobacco Never Used     Past Medical History:  Diagnosis Date  . Hypertension    Past Surgical History:  Procedure Laterality Date  . ABDOMINAL HYSTERECTOMY    . mastectomy Left    Family History  Problem Relation Age of Onset  . Hypertension Mother   . Hypertension Father    Social History   Socioeconomic History  . Marital status: Divorced    Spouse name: Not on file  . Number of children: Not on file  . Years of education: Not on file  . Highest education level: Not on file  Occupational History  . Not on file  Social Needs  . Financial resource strain: Not on file  . Food insecurity:    Worry: Not on file    Inability: Not on file  . Transportation needs:    Medical: Not  on file    Non-medical: Not on file  Tobacco Use  . Smoking status: Never Smoker  . Smokeless tobacco: Never Used  Substance and Sexual Activity  . Alcohol use: Never    Frequency: Never  . Drug use: Never  . Sexual activity: Not on file  Lifestyle  . Physical activity:    Days per week: Not on file    Minutes per session: Not  on file  . Stress: Not on file  Relationships  . Social connections:    Talks on phone: Not on file    Gets together: Not on file    Attends religious service: Not on file    Active member of club or organization: Not on file    Attends meetings of clubs or organizations: Not on file    Relationship status: Not on file  Other Topics Concern  . Not on file  Social History Narrative  . Not on file    Outpatient Encounter Medications as of 05/26/2018  Medication Sig  . amLODipine (NORVASC) 10 MG tablet Take 1 tablet (10 mg total) by mouth daily.  . hydrochlorothiazide (HYDRODIURIL) 25 MG tablet TAKE 1 TABLET BY MOUTH ONCE DAILY  . olmesartan (BENICAR) 40 MG tablet Take 40 mg by mouth daily.  . pravastatin (PRAVACHOL) 20 MG tablet Take 20 mg by mouth daily.  . TRUE METRIX BLOOD GLUCOSE TEST test strip USE AS DIRECTED TO CHECK BLOOD SUGAR TWICE DAILY   No facility-administered encounter medications on file as of 05/26/2018.     Activities of Daily Living In your present state of health, do you have any difficulty performing the following activities: 05/26/2018  Hearing? N  Vision? N  Difficulty concentrating or making decisions? N  Walking or climbing stairs? N  Dressing or bathing? N  Doing errands, shopping? N  Preparing Food and eating ? N  Using the Toilet? N  In the past six months, have you accidently leaked urine? N  Do you have problems with loss of bowel control? N  Managing your Medications? N  Managing your Finances? N  Housekeeping or managing your Housekeeping? N  Some recent data might be hidden    Patient Care Team: Glendale Chard, MD as PCP - General (Internal Medicine)    Assessment:   This is a routine wellness examination for Lorianne.  Exercise Activities and Dietary recommendations Current Exercise Habits: Home exercise routine(walk), Type of exercise: walking, Time (Minutes): 30, Frequency (Times/Week): 2, Weekly Exercise (Minutes/Week): 60, Intensity: Mild  Goals   None     Fall Risk Fall Risk  05/26/2018  Falls in the past year? 0    Timed Get Up and Go performed:  Less than 30 seconds  Depression Screen PHQ 2/9 Scores 05/26/2018  PHQ - 2 Score 0     Cognitive Function MMSE - Mini Mental State Exam 05/26/2018  Orientation to Place 5  Attention/ Calculation 5    Immunization History  Administered Date(s) Administered  . Influenza, High Dose Seasonal PF 04/11/2018  . Pneumococcal Conjugate-13 10/17/2013   Screening Tests Health Maintenance  Topic Date Due  . FOOT EXAM  06/27/1943  . OPHTHALMOLOGY EXAM  06/27/1943  . TETANUS/TDAP  06/26/1952  . DEXA SCAN  06/26/1998  . PNA vac Low Risk Adult (1 of 2 - PCV13) 06/26/1998  . INFLUENZA VACCINE  01/14/2018  . HEMOGLOBIN A1C  07/08/2018    Cancer Screenings: Breast:  Up to date on Mammogram? Does not qualify   Up to date of Bone Density/Dexa? ordered Colorectal: does not qualify, but due to + hemoccult, Gi referral was placed  Malyiah Fellows RODRIGUEZ-SOUTHWORTH, PA-C  05/26/2018

## 2018-05-27 LAB — CMP14 + ANION GAP
ALT: 7 IU/L (ref 0–32)
AST: 17 IU/L (ref 0–40)
Albumin/Globulin Ratio: 1.4 (ref 1.2–2.2)
Albumin: 4.3 g/dL (ref 3.5–4.7)
Alkaline Phosphatase: 62 IU/L (ref 39–117)
Anion Gap: 17 mmol/L (ref 10.0–18.0)
BUN / CREAT RATIO: 14 (ref 12–28)
BUN: 20 mg/dL (ref 8–27)
Bilirubin Total: 0.6 mg/dL (ref 0.0–1.2)
CALCIUM: 9.8 mg/dL (ref 8.7–10.3)
CO2: 25 mmol/L (ref 20–29)
Chloride: 94 mmol/L — ABNORMAL LOW (ref 96–106)
Creatinine, Ser: 1.38 mg/dL — ABNORMAL HIGH (ref 0.57–1.00)
GFR calc Af Amer: 40 mL/min/{1.73_m2} — ABNORMAL LOW (ref >59)
GFR calc non Af Amer: 35 mL/min/{1.73_m2} — ABNORMAL LOW (ref >59)
GLUCOSE: 94 mg/dL (ref 65–99)
Globulin, Total: 3.1 g/dL (ref 1.5–4.5)
POTASSIUM: 3.7 mmol/L (ref 3.5–5.2)
Sodium: 136 mmol/L (ref 134–144)
Total Protein: 7.4 g/dL (ref 6.0–8.5)

## 2018-05-27 LAB — CBC
Hematocrit: 37.6 % (ref 34.0–46.6)
Hemoglobin: 12.2 g/dL (ref 11.1–15.9)
MCH: 29.2 pg (ref 26.6–33.0)
MCHC: 32.4 g/dL (ref 31.5–35.7)
MCV: 90 fL (ref 79–97)
PLATELETS: 330 10*3/uL (ref 150–450)
RBC: 4.18 x10E6/uL (ref 3.77–5.28)
RDW: 13 % (ref 12.3–15.4)
WBC: 5.5 10*3/uL (ref 3.4–10.8)

## 2018-05-27 LAB — HEMOGLOBIN A1C
Est. average glucose Bld gHb Est-mCnc: 126 mg/dL
Hgb A1c MFr Bld: 6 % — ABNORMAL HIGH (ref 4.8–5.6)

## 2018-06-01 ENCOUNTER — Other Ambulatory Visit: Payer: Self-pay | Admitting: Internal Medicine

## 2018-06-01 MED ORDER — TETANUS-DIPHTH-ACELL PERTUSSIS 5-2-15.5 LF-MCG/0.5 IM SUSP: 0.5000 mL | Freq: Once | INTRAMUSCULAR | 0 refills | Status: AC

## 2018-06-01 NOTE — Progress Notes (Unsigned)
Please ask pt if she had a TDAP in the past year, if not I've sent it  To her pharmacy. If so, please find out the date so we can add it to [employer]

## 2018-06-07 ENCOUNTER — Telehealth: Payer: Self-pay

## 2018-06-07 ENCOUNTER — Other Ambulatory Visit: Payer: Self-pay

## 2018-06-07 DIAGNOSIS — Z23 Encounter for immunization: Secondary | ICD-10-CM

## 2018-06-07 MED ORDER — ZOSTER VAC RECOMB ADJUVANTED 50 MCG/0.5ML IM SUSR: 0.5000 mL | Freq: Once | INTRAMUSCULAR | 0 refills | Status: AC

## 2018-06-07 NOTE — Telephone Encounter (Signed)
The pt's daughter [name] was notified that her mom's vaccination for shingles was faxed to her pharmacy.

## 2018-06-21 ENCOUNTER — Other Ambulatory Visit: Payer: Self-pay | Admitting: Internal Medicine

## 2018-06-22 DIAGNOSIS — R195 Other fecal abnormalities: Secondary | ICD-10-CM | POA: Diagnosis not present

## 2018-06-22 DIAGNOSIS — R634 Abnormal weight loss: Secondary | ICD-10-CM | POA: Diagnosis not present

## 2018-06-22 DIAGNOSIS — Z1211 Encounter for screening for malignant neoplasm of colon: Secondary | ICD-10-CM | POA: Diagnosis not present

## 2018-06-30 DIAGNOSIS — D125 Benign neoplasm of sigmoid colon: Secondary | ICD-10-CM | POA: Diagnosis not present

## 2018-06-30 DIAGNOSIS — D123 Benign neoplasm of transverse colon: Secondary | ICD-10-CM | POA: Diagnosis not present

## 2018-06-30 DIAGNOSIS — D120 Benign neoplasm of cecum: Secondary | ICD-10-CM | POA: Diagnosis not present

## 2018-06-30 DIAGNOSIS — Z1211 Encounter for screening for malignant neoplasm of colon: Secondary | ICD-10-CM | POA: Diagnosis not present

## 2018-06-30 DIAGNOSIS — K635 Polyp of colon: Secondary | ICD-10-CM | POA: Diagnosis not present

## 2018-06-30 DIAGNOSIS — R195 Other fecal abnormalities: Secondary | ICD-10-CM | POA: Diagnosis not present

## 2018-06-30 LAB — HM COLONOSCOPY

## 2018-07-02 ENCOUNTER — Encounter: Payer: Self-pay | Admitting: Internal Medicine

## 2018-07-02 ENCOUNTER — Ambulatory Visit: Payer: Medicare PPO | Admitting: Internal Medicine

## 2018-07-02 VITALS — BP 136/78 | HR 93 | Temp 98.0°F | Ht 65.0 in | Wt 158.0 lb

## 2018-07-02 DIAGNOSIS — Z712 Person consulting for explanation of examination or test findings: Secondary | ICD-10-CM | POA: Diagnosis not present

## 2018-07-02 DIAGNOSIS — R9431 Abnormal electrocardiogram [ECG] [EKG]: Secondary | ICD-10-CM

## 2018-07-02 DIAGNOSIS — N183 Chronic kidney disease, stage 3 (moderate): Secondary | ICD-10-CM

## 2018-07-02 DIAGNOSIS — E1122 Type 2 diabetes mellitus with diabetic chronic kidney disease: Secondary | ICD-10-CM

## 2018-07-04 ENCOUNTER — Encounter: Payer: Self-pay | Admitting: Internal Medicine

## 2018-07-04 NOTE — Progress Notes (Signed)
Subjective:     Patient ID: Shannon Mcconnell , female    DOB: [birth year] , 85 y.o.   MRN: [id number]   Chief Complaint  Patient presents with  . irregular heartbeat    HPI  She is here today for further evaluation of an abnormal ekg. She is accompanied by her daughter. She reports she had colonoscopy earlier this week, and was advised by GI that she had an abnormal ekg during the procedure. She does not have a copy of this with her today. Unfortunately, [facility] has not forwarded this information yet. She denies chest pain, shortness of breath and palpitations.     Past Medical History:  Diagnosis Date  . Hypertension      Family History  Problem Relation Age of Onset  . Hypertension Mother   . Hypertension Father      Current Outpatient Medications:  .  amLODipine (NORVASC) 10 MG tablet, Take 1 tablet (10 mg total) by mouth daily., Disp: 90 tablet, Rfl: 1 .  Cholecalciferol (VITAMIN D3) 50 MCG (2000 UT) capsule, TAKE 1 CAPSULE BY MOUTH TWICE DAILY **DISCONTINUE  VITAMIN  D2  50,000  IU**, Disp: 60 capsule, Rfl: 0 .  hydrochlorothiazide (HYDRODIURIL) 25 MG tablet, TAKE 1 TABLET BY MOUTH ONCE DAILY, Disp: 90 tablet, Rfl: 0 .  olmesartan (BENICAR) 40 MG tablet, Take 40 mg by mouth daily., Disp: , Rfl:  .  pravastatin (PRAVACHOL) 20 MG tablet, Take 20 mg by mouth daily., Disp: , Rfl:  .  TRUE METRIX BLOOD GLUCOSE TEST test strip, USE AS DIRECTED TO CHECK BLOOD SUGAR TWICE DAILY, Disp: , Rfl: 99   No Known Allergies   Review of Systems  Constitutional: Negative.   Respiratory: Negative.   Cardiovascular: Negative.   Gastrointestinal: Negative.   Neurological: Negative.   Psychiatric/Behavioral: Negative.      Today's Vitals   07/02/18 1536  BP: 136/78  Pulse: 93  Temp: 98 F (36.7 C)  TempSrc: Oral  SpO2: 98%  Weight: 158 lb (71.7 kg)  Height: 5\' 5"  (1.651 m)  PainSc: 0-No pain   Body mass index is 26.29 kg/m.   Objective:  Physical  Exam Vitals signs and nursing note reviewed.  Constitutional:      Appearance: Normal appearance.  HENT:     Head: Normocephalic and atraumatic.  Cardiovascular:     Rate and Rhythm: Normal rate and regular rhythm.     Heart sounds: Normal heart sounds.  Pulmonary:     Effort: Pulmonary effort is normal.     Breath sounds: Normal breath sounds.  Skin:    General: Skin is warm.  Neurological:     Mental Status: She is alert.  Psychiatric:        Mood and Affect: Mood normal.        Thought Content: Thought content normal.         Assessment And Plan:     1. Abnormal EKG  I contacted Dr. Lorie Apley office who unfortunately is unable to forward the EKG tracing for my review. She reports the endoscopy suite is closed. She promises to forward the information on Monday. EKG performed today, there are no new changes. At this point, I do not think  further evaluation is warranted. Pt advised I will make further recommendatons once I have the EKG tracing.     - EKG 12-Lead  2. Type 2 diabetes mellitus with stage 3 chronic kidney disease, without long-term current use of insulin (HCC)  Her  daughter expressed concerned about her mothers nutrition. She is concerned that she does not eat enough. Pt reports she limits her food intake b/c she does not want to take meds for her dm. Her recent labs were reviewed in detail. She is advised of 5 pound weight gain since her last visit. Pt advised she can be more liberal with her eating. She is doing great at controlling her blood sugars. Pt advised she can safely gain another 5- 7 pounds without compromising her diabetic control. Both the patient and her daughter are accepting of the information. All questions were answered to her satisfaction. Greater than 50% of her face to face time was spent in counseling and coordination of care. More than 25 minutes was spent in counseling and coordination of care.   [name], MD

## 2018-07-22 ENCOUNTER — Encounter: Payer: Self-pay | Admitting: Internal Medicine

## 2018-07-26 ENCOUNTER — Other Ambulatory Visit: Payer: Self-pay | Admitting: Internal Medicine

## 2018-08-12 DIAGNOSIS — K449 Diaphragmatic hernia without obstruction or gangrene: Secondary | ICD-10-CM | POA: Diagnosis not present

## 2018-08-12 DIAGNOSIS — K5730 Diverticulosis of large intestine without perforation or abscess without bleeding: Secondary | ICD-10-CM | POA: Diagnosis not present

## 2018-08-12 DIAGNOSIS — R195 Other fecal abnormalities: Secondary | ICD-10-CM | POA: Diagnosis not present

## 2018-08-17 LAB — FECAL OCCULT BLOOD, GUAIAC: Fecal Occult Blood: NEGATIVE

## 2018-08-18 LAB — FECAL OCCULT BLOOD, GUAIAC: Fecal Occult Blood: NEGATIVE

## 2018-08-19 LAB — FECAL OCCULT BLOOD, GUAIAC: Fecal Occult Blood: NEGATIVE

## 2018-08-20 ENCOUNTER — Encounter: Payer: Self-pay | Admitting: Internal Medicine

## 2018-08-20 ENCOUNTER — Encounter: Payer: Self-pay | Admitting: Gastroenterology

## 2018-08-26 ENCOUNTER — Encounter: Payer: Self-pay | Admitting: Internal Medicine

## 2018-08-26 ENCOUNTER — Other Ambulatory Visit: Payer: Self-pay

## 2018-08-26 ENCOUNTER — Ambulatory Visit: Payer: Medicare PPO | Admitting: Internal Medicine

## 2018-08-26 VITALS — BP 136/88 | HR 94 | Temp 98.7°F | Ht 65.8 in | Wt 155.6 lb

## 2018-08-26 DIAGNOSIS — E1122 Type 2 diabetes mellitus with diabetic chronic kidney disease: Secondary | ICD-10-CM

## 2018-08-26 DIAGNOSIS — I129 Hypertensive chronic kidney disease with stage 1 through stage 4 chronic kidney disease, or unspecified chronic kidney disease: Secondary | ICD-10-CM

## 2018-08-26 DIAGNOSIS — E7800 Pure hypercholesterolemia, unspecified: Secondary | ICD-10-CM

## 2018-08-26 DIAGNOSIS — N183 Chronic kidney disease, stage 3 (moderate): Secondary | ICD-10-CM

## 2018-08-26 DIAGNOSIS — Z6825 Body mass index (BMI) 25.0-25.9, adult: Secondary | ICD-10-CM

## 2018-08-26 NOTE — Patient Instructions (Signed)
Diabetes Mellitus and Nutrition, Adult  When you have diabetes (diabetes mellitus), it is very important to have healthy eating habits because your blood sugar (glucose) levels are greatly affected by what you eat and drink. Eating healthy foods in the appropriate amounts, at about the same times every day, can help you:  · Control your blood glucose.  · Lower your risk of heart disease.  · Improve your blood pressure.  · Reach or maintain a healthy weight.  Every person with diabetes is different, and each person has different needs for a meal plan. Your health care provider may recommend that you work with a diet and nutrition specialist (dietitian) to make a meal plan that is best for you. Your meal plan may vary depending on factors such as:  · The calories you need.  · The medicines you take.  · Your weight.  · Your blood glucose, blood pressure, and cholesterol levels.  · Your activity level.  · Other health conditions you have, such as heart or kidney disease.  How do carbohydrates affect me?  Carbohydrates, also called carbs, affect your blood glucose level more than any other type of food. Eating carbs naturally raises the amount of glucose in your blood. Carb counting is a method for keeping track of how many carbs you eat. Counting carbs is important to keep your blood glucose at a healthy level, especially if you use insulin or take certain oral diabetes medicines.  It is important to know how many carbs you can safely have in each meal. This is different for every person. Your dietitian can help you calculate how many carbs you should have at each meal and for each snack.  Foods that contain carbs include:  · Bread, cereal, rice, pasta, and crackers.  · Potatoes and corn.  · Peas, beans, and lentils.  · Milk and yogurt.  · Fruit and juice.  · Desserts, such as cakes, cookies, ice cream, and candy.  How does alcohol affect me?  Alcohol can cause a sudden decrease in blood glucose (hypoglycemia),  especially if you use insulin or take certain oral diabetes medicines. Hypoglycemia can be a life-threatening condition. Symptoms of hypoglycemia (sleepiness, dizziness, and confusion) are similar to symptoms of having too much alcohol.  If your health care provider says that alcohol is safe for you, follow these guidelines:  · Limit alcohol intake to no more than 1 drink per day for nonpregnant women and 2 drinks per day for men. One drink equals 12 oz of beer, 5 oz of wine, or 1½ oz of hard liquor.  · Do not drink on an empty stomach.  · Keep yourself hydrated with water, diet soda, or unsweetened iced tea.  · Keep in mind that regular soda, juice, and other mixers may contain a lot of sugar and must be counted as carbs.  What are tips for following this plan?    Reading food labels  · Start by checking the serving size on the "Nutrition Facts" label of packaged foods and drinks. The amount of calories, carbs, fats, and other nutrients listed on the label is based on one serving of the item. Many items contain more than one serving per package.  · Check the total grams (g) of carbs in one serving. You can calculate the number of servings of carbs in one serving by dividing the total carbs by 15. For example, if a food has 30 g of total carbs, it would be equal to 2   servings of carbs.  · Check the number of grams (g) of saturated and trans fats in one serving. Choose foods that have low or no amount of these fats.  · Check the number of milligrams (mg) of salt (sodium) in one serving. Most people should limit total sodium intake to less than 2,300 mg per day.  · Always check the nutrition information of foods labeled as "low-fat" or "nonfat". These foods may be higher in added sugar or refined carbs and should be avoided.  · Talk to your dietitian to identify your daily goals for nutrients listed on the label.  Shopping  · Avoid buying canned, premade, or processed foods. These foods tend to be high in fat, sodium,  and added sugar.  · Shop around the outside edge of the grocery store. This includes fresh fruits and vegetables, bulk grains, fresh meats, and fresh dairy.  Cooking  · Use low-heat cooking methods, such as baking, instead of high-heat cooking methods like deep frying.  · Cook using healthy oils, such as olive, canola, or sunflower oil.  · Avoid cooking with butter, cream, or high-fat meats.  Meal planning  · Eat meals and snacks regularly, preferably at the same times every day. Avoid going long periods of time without eating.  · Eat foods high in fiber, such as fresh fruits, vegetables, beans, and whole grains. Talk to your dietitian about how many servings of carbs you can eat at each meal.  · Eat 4-6 ounces (oz) of lean protein each day, such as lean meat, chicken, fish, eggs, or tofu. One oz of lean protein is equal to:  ? 1 oz of meat, chicken, or fish.  ? 1 egg.  ? ¼ cup of tofu.  · Eat some foods each day that contain healthy fats, such as avocado, nuts, seeds, and fish.  Lifestyle  · Check your blood glucose regularly.  · Exercise regularly as told by your health care provider. This may include:  ? 150 minutes of moderate-intensity or vigorous-intensity exercise each week. This could be brisk walking, biking, or water aerobics.  ? Stretching and doing strength exercises, such as yoga or weightlifting, at least 2 times a week.  · Take medicines as told by your health care provider.  · Do not use any products that contain nicotine or tobacco, such as cigarettes and e-cigarettes. If you need help quitting, ask your health care provider.  · Work with a counselor or diabetes educator to identify strategies to manage stress and any emotional and social challenges.  Questions to ask a health care provider  · Do I need to meet with a diabetes educator?  · Do I need to meet with a dietitian?  · What number can I call if I have questions?  · When are the best times to check my blood glucose?  Where to find more  information:  · American Diabetes Association: diabetes.org  · Academy of Nutrition and Dietetics: www.eatright.org  · National Institute of Diabetes and Digestive and Kidney Diseases (NIH): www.niddk.nih.gov  Summary  · A healthy meal plan will help you control your blood glucose and maintain a healthy lifestyle.  · Working with a diet and nutrition specialist (dietitian) can help you make a meal plan that is best for you.  · Keep in mind that carbohydrates (carbs) and alcohol have immediate effects on your blood glucose levels. It is important to count carbs and to use alcohol carefully.  This information is not intended to   replace advice given to you by your health care provider. Make sure you discuss any questions you have with your health care provider.  Document Released: 02/27/2005 Document Revised: 12/31/2016 Document Reviewed: 07/07/2016  Elsevier Interactive Patient Education © 2019 [employer] Inc[employer]

## 2018-08-27 LAB — HEMOGLOBIN A1C
Est. average glucose Bld gHb Est-mCnc: 120 mg/dL
HEMOGLOBIN A1C: 5.8 % — AB (ref 4.8–5.6)

## 2018-08-27 LAB — LIPID PANEL
CHOL/HDL RATIO: 2.7 ratio (ref 0.0–4.4)
Cholesterol, Total: 187 mg/dL (ref 100–199)
HDL: 70 mg/dL (ref >39)
LDL CALC: 103 mg/dL — AB (ref 0–99)
Triglycerides: 68 mg/dL (ref 0–149)
VLDL CHOLESTEROL CAL: 14 mg/dL (ref 5–40)

## 2018-08-27 LAB — CMP14+EGFR
ALT: 9 IU/L (ref 0–32)
AST: 19 IU/L (ref 0–40)
Albumin/Globulin Ratio: 1.5 (ref 1.2–2.2)
Albumin: 4.2 g/dL (ref 3.6–4.6)
Alkaline Phosphatase: 61 IU/L (ref 39–117)
BUN / CREAT RATIO: 16 (ref 12–28)
BUN: 19 mg/dL (ref 8–27)
Bilirubin Total: 0.6 mg/dL (ref 0.0–1.2)
CALCIUM: 9.6 mg/dL (ref 8.7–10.3)
CO2: 23 mmol/L (ref 20–29)
CREATININE: 1.21 mg/dL — AB (ref 0.57–1.00)
Chloride: 96 mmol/L (ref 96–106)
GFR calc Af Amer: 47 mL/min/{1.73_m2} — ABNORMAL LOW (ref >59)
GFR calc non Af Amer: 41 mL/min/{1.73_m2} — ABNORMAL LOW (ref >59)
GLUCOSE: 99 mg/dL (ref 65–99)
Globulin, Total: 2.8 g/dL (ref 1.5–4.5)
Potassium: 3.8 mmol/L (ref 3.5–5.2)
Sodium: 135 mmol/L (ref 134–144)
Total Protein: 7 g/dL (ref 6.0–8.5)

## 2018-08-30 ENCOUNTER — Encounter: Payer: Self-pay | Admitting: Internal Medicine

## 2018-08-30 ENCOUNTER — Telehealth: Payer: Self-pay

## 2018-08-30 DIAGNOSIS — E7800 Pure hypercholesterolemia, unspecified: Secondary | ICD-10-CM

## 2018-08-30 HISTORY — DX: Pure hypercholesterolemia, unspecified: E78.00

## 2018-08-30 NOTE — Telephone Encounter (Signed)
Left the patient a message to call back for lab results. 

## 2018-08-30 NOTE — Progress Notes (Signed)
Subjective:     Patient ID: Shannon Mcconnell , female    DOB: Nov 03, 1931 , 85 y.o.   MRN: [id number]   Chief Complaint  Patient presents with  . Diabetes  . Hypertension    HPI  Diabetes  She presents for her follow-up diabetic visit. She has type 2 diabetes mellitus. Her disease course has been stable. There are no hypoglycemic associated symptoms. Pertinent negatives for diabetes include no blurred vision and no chest pain. There are no hypoglycemic complications. Diabetic complications include nephropathy. Risk factors for coronary artery disease include diabetes mellitus, hypertension, post-menopausal and dyslipidemia. She is following a diabetic diet. She participates in exercise intermittently. Her breakfast blood glucose is taken between 8-9 am. Her breakfast blood glucose range is generally 110-130 mg/dl. Eye exam is current.  Hypertension  This is a chronic problem. The current episode started more than 1 year ago. The problem has been gradually improving since onset. The problem is controlled. Pertinent negatives include no blurred vision, chest pain, palpitations or shortness of breath.  She reports compliance with meds.     Past Medical History:  Diagnosis Date  . DM (diabetes mellitus) ([place])   . High cholesterol   . Hypertension      Family History  Problem Relation Age of Onset  . Hypertension Mother   . Hypertension Father      Current Outpatient Medications:  .  amLODipine (NORVASC) 10 MG tablet, Take 1 tablet (10 mg total) by mouth daily., Disp: 90 tablet, Rfl: 1 .  Cholecalciferol (VITAMIN D3) 50 MCG (2000 UT) capsule, TAKE 1 CAPSULE BY MOUTH TWICE DAILY **DISCONTINUE  VITAMIN  D2  50,000  IU**, Disp: 60 capsule, Rfl: 0 .  hydrochlorothiazide (HYDRODIURIL) 25 MG tablet, TAKE 1 TABLET BY MOUTH ONCE DAILY, Disp: 90 tablet, Rfl: 0 .  olmesartan (BENICAR) 40 MG tablet, Take 40 mg by mouth daily., Disp: , Rfl:  .  pravastatin (PRAVACHOL) 20 MG tablet, Take 20 mg by  mouth daily., Disp: , Rfl:  .  TRUE METRIX BLOOD GLUCOSE TEST test strip, USE AS DIRECTED TO CHECK BLOOD SUGAR TWICE DAILY, Disp: , Rfl: 99   No Known Allergies   Review of Systems  Constitutional: Negative.   Eyes: Negative for blurred vision.  Respiratory: Negative.  Negative for shortness of breath.   Cardiovascular: Negative.  Negative for chest pain and palpitations.  Gastrointestinal: Negative.   Neurological: Negative.   Psychiatric/Behavioral: Negative.      Today's Vitals   08/26/18 1030  BP: 136/88  Pulse: 94  Temp: 98.7 F (37.1 C)  TempSrc: Oral  Weight: 155 lb 9.6 oz (70.6 kg)  Height: 5' 5.8" (1.671 m)  PainSc: 1    Body mass index is 25.27 kg/m.   Objective:  Physical Exam Vitals signs and nursing note reviewed.  Constitutional:      Appearance: Normal appearance.  HENT:     Head: Normocephalic and atraumatic.  Cardiovascular:     Rate and Rhythm: Normal rate and regular rhythm.     Heart sounds: Normal heart sounds.  Pulmonary:     Effort: Pulmonary effort is normal.     Breath sounds: Normal breath sounds.  Skin:    General: Skin is warm.  Neurological:     General: No focal deficit present.     Mental Status: She is alert.  Psychiatric:        Mood and Affect: Mood normal.        Behavior: Behavior normal.  Assessment And Plan:     1. Type 2 diabetes mellitus with stage 3 chronic kidney disease, without long-term current use of insulin ([place])  I will check labs as listed below. Pt is not on any medication. Pt encouraged to increase food intake slightly, she is reassured that her sugars are well controlled.   - CMP14+EGFR - Hemoglobin A1c  2. Hypertensive nephropathy  Fair control. She is encouraged to increase her daily activity. She will continue with current meds for now.   3. Pure hypercholesterolemia  I will check a fasting lipid panel.she is encouraged to avoid fried foods. I will make further recommendations once I  review her lab results.   - Lipid panel  4. BMI 25.0-25.9,adult  Her weight is stable.   [name], MD

## 2018-08-30 NOTE — Telephone Encounter (Signed)
-----   Message from Glendale Chard, MD sent at 08/27/2018 10:31 AM EDT ----- Your LDL, bad chol is 103.  Your kidney fxn is stable. Your liver fxn is nl. Your hba1c is 5.8, this is great. You may eat more liberally - pls dont stress over your sugar!

## 2018-09-02 ENCOUNTER — Ambulatory Visit: Payer: Self-pay | Admitting: Internal Medicine

## 2018-09-02 ENCOUNTER — Other Ambulatory Visit: Payer: Self-pay

## 2018-09-02 DIAGNOSIS — E1122 Type 2 diabetes mellitus with diabetic chronic kidney disease: Secondary | ICD-10-CM

## 2018-09-02 DIAGNOSIS — N186 End stage renal disease: Principal | ICD-10-CM

## 2018-09-02 MED ORDER — BLOOD GLUCOSE MONITOR KIT: PACK | 0 refills | Status: DC

## 2018-09-17 ENCOUNTER — Encounter: Payer: Self-pay | Admitting: Internal Medicine

## 2018-10-21 ENCOUNTER — Encounter: Payer: Self-pay | Admitting: Internal Medicine

## 2018-10-28 ENCOUNTER — Other Ambulatory Visit: Payer: Self-pay | Admitting: Internal Medicine

## 2018-11-20 ENCOUNTER — Other Ambulatory Visit: Payer: Self-pay | Admitting: Internal Medicine

## 2018-11-22 ENCOUNTER — Other Ambulatory Visit: Payer: Self-pay

## 2018-11-22 ENCOUNTER — Ambulatory Visit: Payer: Medicare PPO | Admitting: Internal Medicine

## 2018-11-22 ENCOUNTER — Encounter: Payer: Self-pay | Admitting: Internal Medicine

## 2018-11-22 VITALS — BP 124/68 | HR 92 | Temp 98.6°F | Ht 65.8 in | Wt 152.6 lb

## 2018-11-22 DIAGNOSIS — M79642 Pain in left hand: Secondary | ICD-10-CM

## 2018-11-22 DIAGNOSIS — N183 Chronic kidney disease, stage 3 unspecified: Secondary | ICD-10-CM

## 2018-11-22 DIAGNOSIS — E7800 Pure hypercholesterolemia, unspecified: Secondary | ICD-10-CM

## 2018-11-22 DIAGNOSIS — I129 Hypertensive chronic kidney disease with stage 1 through stage 4 chronic kidney disease, or unspecified chronic kidney disease: Secondary | ICD-10-CM | POA: Diagnosis not present

## 2018-11-22 DIAGNOSIS — M1711 Unilateral primary osteoarthritis, right knee: Secondary | ICD-10-CM | POA: Diagnosis not present

## 2018-11-22 MED ORDER — PRAVASTATIN SODIUM 20 MG PO TABS: 20.0000 mg | ORAL_TABLET | Freq: Every day | ORAL | 1 refills | Status: DC

## 2018-11-22 NOTE — Patient Instructions (Signed)
DASH Eating Plan  DASH stands for "Dietary Approaches to Stop Hypertension." The DASH eating plan is a healthy eating plan that has been shown to reduce high blood pressure (hypertension). It may also reduce your risk for type 2 diabetes, heart disease, and stroke. The DASH eating plan may also help with weight loss.  What are tips for following this plan?    General guidelines   Avoid eating more than 2,300 mg (milligrams) of salt (sodium) a day. If you have hypertension, you may need to reduce your sodium intake to 1,500 mg a day.   Limit alcohol intake to no more than 1 drink a day for nonpregnant women and 2 drinks a day for men. One drink equals 12 oz of beer, 5 oz of wine, or 1 oz of hard liquor.   Work with your health care provider to maintain a healthy body weight or to lose weight. Ask what an ideal weight is for you.   Get at least 30 minutes of exercise that causes your heart to beat faster (aerobic exercise) most days of the week. Activities may include walking, swimming, or biking.   Work with your health care provider or diet and nutrition specialist (dietitian) to adjust your eating plan to your individual calorie needs.  Reading food labels     Check food labels for the amount of sodium per serving. Choose foods with less than 5 percent of the Daily Value of sodium. Generally, foods with less than 300 mg of sodium per serving fit into this eating plan.   To find whole grains, look for the word "whole" as the first word in the ingredient list.  Shopping   Buy products labeled as "low-sodium" or "no salt added."   Buy fresh foods. Avoid canned foods and premade or frozen meals.  Cooking   Avoid adding salt when cooking. Use salt-free seasonings or herbs instead of table salt or sea salt. Check with your health care provider or pharmacist before using salt substitutes.   Do not fry foods. Cook foods using healthy methods such as baking, boiling, grilling, and broiling instead.   Cook with  heart-healthy oils, such as olive, canola, soybean, or sunflower oil.  Meal planning   Eat a balanced diet that includes:  ? 5 or more servings of fruits and vegetables each day. At each meal, try to fill half of your plate with fruits and vegetables.  ? Up to 6-8 servings of whole grains each day.  ? Less than 6 oz of lean meat, poultry, or fish each day. A 3-oz serving of meat is about the same size as a deck of cards. One egg equals 1 oz.  ? 2 servings of low-fat dairy each day.  ? A serving of nuts, seeds, or beans 5 times each week.  ? Heart-healthy fats. Healthy fats called Omega-3 fatty acids are found in foods such as flaxseeds and coldwater fish, like sardines, salmon, and mackerel.   Limit how much you eat of the following:  ? Canned or prepackaged foods.  ? Food that is high in trans fat, such as fried foods.  ? Food that is high in saturated fat, such as fatty meat.  ? Sweets, desserts, sugary drinks, and other foods with added sugar.  ? Full-fat dairy products.   Do not salt foods before eating.   Try to eat at least 2 vegetarian meals each week.   Eat more home-cooked food and less restaurant, buffet, and fast food.     When eating at a restaurant, ask that your food be prepared with less salt or no salt, if possible.  What foods are recommended?  The items listed may not be a complete list. Talk with your dietitian about what dietary choices are best for you.  Grains  Whole-grain or whole-wheat bread. Whole-grain or whole-wheat pasta. Brown rice. Oatmeal. Quinoa. Bulgur. Whole-grain and low-sodium cereals. Pita bread. Low-fat, low-sodium crackers. Whole-wheat flour tortillas.  Vegetables  Fresh or frozen vegetables (raw, steamed, roasted, or grilled). Low-sodium or reduced-sodium tomato and vegetable juice. Low-sodium or reduced-sodium tomato sauce and tomato paste. Low-sodium or reduced-sodium canned vegetables.  Fruits  All fresh, dried, or frozen fruit. Canned fruit in natural juice (without  added sugar).  Meat and other protein foods  Skinless chicken or turkey. Ground chicken or turkey. Pork with fat trimmed off. Fish and seafood. Egg whites. Dried beans, peas, or lentils. Unsalted nuts, nut butters, and seeds. Unsalted canned beans. Lean cuts of beef with fat trimmed off. Low-sodium, lean deli meat.  Dairy  Low-fat (1%) or fat-free (skim) milk. Fat-free, low-fat, or reduced-fat cheeses. Nonfat, low-sodium ricotta or cottage cheese. Low-fat or nonfat yogurt. Low-fat, low-sodium cheese.  Fats and oils  Soft margarine without trans fats. Vegetable oil. Low-fat, reduced-fat, or light mayonnaise and salad dressings (reduced-sodium). Canola, safflower, olive, soybean, and sunflower oils. Avocado.  Seasoning and other foods  Herbs. Spices. Seasoning mixes without salt. Unsalted popcorn and pretzels. Fat-free sweets.  What foods are not recommended?  The items listed may not be a complete list. Talk with your dietitian about what dietary choices are best for you.  Grains  Baked goods made with fat, such as croissants, muffins, or some breads. Dry pasta or rice meal packs.  Vegetables  Creamed or fried vegetables. Vegetables in a cheese sauce. Regular canned vegetables (not low-sodium or reduced-sodium). Regular canned tomato sauce and paste (not low-sodium or reduced-sodium). Regular tomato and vegetable juice (not low-sodium or reduced-sodium). Pickles. Olives.  Fruits  Canned fruit in a light or heavy syrup. Fried fruit. Fruit in cream or butter sauce.  Meat and other protein foods  Fatty cuts of meat. Ribs. Fried meat. Bacon. Sausage. Bologna and other processed lunch meats. Salami. Fatback. Hotdogs. Bratwurst. Salted nuts and seeds. Canned beans with added salt. Canned or smoked fish. Whole eggs or egg yolks. Chicken or turkey with skin.  Dairy  Whole or 2% milk, cream, and half-and-half. Whole or full-fat cream cheese. Whole-fat or sweetened yogurt. Full-fat cheese. Nondairy creamers. Whipped toppings.  Processed cheese and cheese spreads.  Fats and oils  Butter. Stick margarine. Lard. Shortening. Ghee. Bacon fat. Tropical oils, such as coconut, palm kernel, or palm oil.  Seasoning and other foods  Salted popcorn and pretzels. Onion salt, garlic salt, seasoned salt, table salt, and sea salt. Worcestershire sauce. Tartar sauce. Barbecue sauce. Teriyaki sauce. Soy sauce, including reduced-sodium. Steak sauce. Canned and packaged gravies. Fish sauce. Oyster sauce. Cocktail sauce. Horseradish that you find on the shelf. Ketchup. Mustard. Meat flavorings and tenderizers. Bouillon cubes. Hot sauce and Tabasco sauce. Premade or packaged marinades. Premade or packaged taco seasonings. Relishes. Regular salad dressings.  Where to find more information:   National Heart, Lung, and Blood Institute: www.nhlbi.nih.gov   American Heart Association: www.heart.org  Summary   The DASH eating plan is a healthy eating plan that has been shown to reduce high blood pressure (hypertension). It may also reduce your risk for type 2 diabetes, heart disease, and stroke.   With the   DASH eating plan, you should limit salt (sodium) intake to 2,300 mg a day. If you have hypertension, you may need to reduce your sodium intake to 1,500 mg a day.   When on the DASH eating plan, aim to eat more fresh fruits and vegetables, whole grains, lean proteins, low-fat dairy, and heart-healthy fats.   Work with your health care provider or diet and nutrition specialist (dietitian) to adjust your eating plan to your individual calorie needs.  This information is not intended to replace advice given to you by your health care provider. Make sure you discuss any questions you have with your health care provider.  Document Released: 05/22/2011 Document Revised: 05/26/2016 Document Reviewed: 05/26/2016  Elsevier Interactive Patient Education  2019 [employer] Inc.

## 2018-11-23 ENCOUNTER — Telehealth: Payer: Self-pay

## 2018-11-23 LAB — BMP8+EGFR
BUN/Creatinine Ratio: 12 (ref 12–28)
BUN: 17 mg/dL (ref 8–27)
CO2: 24 mmol/L (ref 20–29)
Calcium: 9.3 mg/dL (ref 8.7–10.3)
Chloride: 99 mmol/L (ref 96–106)
Creatinine, Ser: 1.47 mg/dL — ABNORMAL HIGH (ref 0.57–1.00)
GFR calc Af Amer: 37 mL/min/{1.73_m2} — ABNORMAL LOW (ref >59)
GFR calc non Af Amer: 32 mL/min/{1.73_m2} — ABNORMAL LOW (ref >59)
Glucose: 101 mg/dL — ABNORMAL HIGH (ref 65–99)
Potassium: 3.5 mmol/L (ref 3.5–5.2)
Sodium: 138 mmol/L (ref 134–144)

## 2018-11-23 NOTE — Telephone Encounter (Signed)
-----   Message from Glendale Chard, MD sent at 11/23/2018  7:05 AM EDT ----- Your kidney fxn has decreased. Be sure to stay well hydrated. I think we need to decrease your water pill. How many more hctz pills do you have? I would like to decrease to 12.5mg  daily and have you rto in 2-3 months for bp check. How do you feel about this?

## 2018-11-23 NOTE — Telephone Encounter (Signed)
Left the patient a message to call back for lab results. 

## 2018-11-25 ENCOUNTER — Ambulatory Visit: Payer: Self-pay | Admitting: Internal Medicine

## 2018-11-27 NOTE — Progress Notes (Signed)
Subjective:     Patient ID: Shannon Mcconnell , female    DOB: Apr 29, 1932 , 85 y.o.   MRN: [id number]   Chief Complaint  Patient presents with  . Hypertension  . Hyperlipidemia  . Hand Pain    left    HPI  Hypertension This is a chronic problem. The current episode started more than 1 year ago. The problem has been gradually improving since onset. The problem is controlled. Pertinent negatives include no blurred vision, chest pain, palpitations or shortness of breath. Risk factors for coronary artery disease include diabetes mellitus, dyslipidemia, post-menopausal state and sedentary lifestyle. Past treatments include angiotensin blockers, diuretics and ACE inhibitors. The current treatment provides moderate improvement. Compliance problems include exercise.  Identifiable causes of hypertension include chronic renal disease.  Hyperlipidemia This is a chronic problem. The current episode started more than 1 year ago. The problem is controlled. Exacerbating diseases include chronic renal disease and diabetes. Pertinent negatives include no chest pain or shortness of breath. Current antihyperlipidemic treatment includes statins. The current treatment provides moderate improvement of lipids.  Hand Pain  There was no injury mechanism. The pain is present in the left hand. The quality of the pain is described as aching. The pain does not radiate. The pain is at a severity of 6/10. The pain is moderate. The pain has been intermittent since the incident. Pertinent negatives include no chest pain. Nothing aggravates the symptoms. She has tried acetaminophen for the symptoms. The treatment provided mild relief.     Past Medical History:  Diagnosis Date  . DM (diabetes mellitus) ([place])   . High cholesterol   . Hypertension      Family History  Problem Relation Age of Onset  . Hypertension Mother   . Hypertension Father      Current Outpatient Medications:  .  amLODipine (NORVASC) 10 MG  tablet, Take 1 tablet (10 mg total) by mouth daily., Disp: 90 tablet, Rfl: 1 .  blood glucose meter kit and supplies KIT, Dispense based on patient and insurance preference. Use up to four times daily as directed. (FOR ICD-9 250.00, 250.01)., Disp: 1 each, Rfl: 0 .  Cholecalciferol (VITAMIN D3) 50 MCG (2000 UT) capsule, TAKE 1 CAPSULE BY MOUTH TWICE DAILY **DISCONTINUE  VITAMIN  D2  50,000  IU**, Disp: 60 capsule, Rfl: 0 .  hydrochlorothiazide (HYDRODIURIL) 25 MG tablet, Take 1 tablet by mouth once daily, Disp: 90 tablet, Rfl: 0 .  olmesartan (BENICAR) 40 MG tablet, Take 40 mg by mouth daily., Disp: , Rfl:  .  pravastatin (PRAVACHOL) 20 MG tablet, Take 1 tablet (20 mg total) by mouth daily., Disp: 90 tablet, Rfl: 1 .  TRUE METRIX BLOOD GLUCOSE TEST test strip, USE AS DIRECTED TO CHECK BLOOD SUGAR TWICE DAILY, Disp: , Rfl: 99 .  pravastatin (PRAVACHOL) 20 MG tablet, Take 1 tablet by mouth once daily, Disp: 90 tablet, Rfl: 0   No Known Allergies   Review of Systems  Constitutional: Negative.   Eyes: Negative for blurred vision.  Respiratory: Negative.  Negative for shortness of breath.   Cardiovascular: Negative.  Negative for chest pain and palpitations.  Gastrointestinal: Negative.   Musculoskeletal: Positive for arthralgias.  Neurological: Negative.   Psychiatric/Behavioral: Negative.      Today's Vitals   11/22/18 1148  BP: 124/68  Pulse: 92  Temp: 98.6 F (37 C)  TempSrc: Oral  Weight: 152 lb 9.6 oz (69.2 kg)  Height: 5' 5.8" (1.671 m)  PainSc: 0-No pain   Body  mass index is 24.78 kg/m.   Objective:  Physical Exam Vitals signs and nursing note reviewed.  Constitutional:      Appearance: Normal appearance.  HENT:     Head: Normocephalic and atraumatic.  Cardiovascular:     Rate and Rhythm: Normal rate and regular rhythm.     Heart sounds: Normal heart sounds.  Pulmonary:     Effort: Pulmonary effort is normal.     Breath sounds: Normal breath sounds.   Musculoskeletal:     Left hand: She exhibits tenderness and bony tenderness.  Skin:    General: Skin is warm.  Neurological:     General: No focal deficit present.     Mental Status: She is alert.  Psychiatric:        Mood and Affect: Mood normal.        Behavior: Behavior normal.         Assessment And Plan:     1. Hypertensive nephropathy  Well controlled. She will continue with current meds. She is encouraged to avoid adding salt to her foods. She will rto in 4 - 6  Months for re-evaluation.   - BMP8+EGFR  2. Chronic renal disease, stage III (HCC)  Chronic. Importance of adequate hydration was discussed with the patient.   3. Pure hypercholesterolemia  I will check a fasting lipid panel at her next visit. She is encouraged to avoid fried foods.   4. Left hand pain  Chronic. Pt advised that her sx are suggestive of osteoarthritis. She is advised to take tylenol prn. Negative squeeze test.   5. Primary osteoarthritis of right knee  Chronic. She is already followed by Ortho. She does not wish to move forward with TKR at this time. Pt advised that she may benefit from steroid/gel injections.   [name], MD    THE PATIENT IS ENCOURAGED TO PRACTICE SOCIAL DISTANCING DUE TO THE COVID-19 PANDEMIC.

## 2018-12-01 ENCOUNTER — Telehealth: Payer: Self-pay

## 2018-12-01 NOTE — Telephone Encounter (Signed)
-----   Message from Glendale Chard, MD sent at 11/23/2018  7:05 AM EDT ----- Your kidney fxn has decreased. Be sure to stay well hydrated. I think we need to decrease your water pill. How many more hctz pills do you have? I would like to decrease to 12.5mg  daily and have you rto in 2-3 months for bp check. How do you feel about this?

## 2018-12-01 NOTE — Telephone Encounter (Signed)
Letter mailed to the pt to contact the office for her lab results.

## 2018-12-08 ENCOUNTER — Telehealth: Payer: Self-pay

## 2018-12-08 NOTE — Telephone Encounter (Signed)
-----   Message from Glendale Chard, MD sent at 11/23/2018  7:05 AM EDT ----- Your kidney fxn has decreased. Be sure to stay well hydrated. I think we need to decrease your water pill. How many more hctz pills do you have? I would like to decrease to 12.5mg  daily and have you rto in 2-3 months for bp check. How do you feel about this?    The pt said that she just got her HCTZ filled on the 14th of June so she has quit a bit left and that she will do whatever Dr. Baird Cancer thinks she should do.

## 2018-12-13 NOTE — Telephone Encounter (Signed)
Is she able to cut pills in half?

## 2018-12-22 ENCOUNTER — Telehealth: Payer: Self-pay

## 2018-12-22 NOTE — Telephone Encounter (Signed)
The patient called back and said yes she can cut her hydrochlorothiazide in half.  I told the pt that she needed to take 1/2 the tab daily and f/u in 3 months was scheduled.

## 2018-12-22 NOTE — Telephone Encounter (Signed)
Please f/u with her in a day or two to confirm. thanks

## 2018-12-22 NOTE — Telephone Encounter (Signed)
Since we are changing her meds, I would like for her to come in six weeks for f/u. Sorry for the confusion. Thanks

## 2018-12-22 NOTE — Telephone Encounter (Signed)
The pt said that she will try to cut a pill in half and let the office know if she can or not.

## 2019-01-18 ENCOUNTER — Encounter: Payer: Self-pay | Admitting: Internal Medicine

## 2019-01-22 ENCOUNTER — Other Ambulatory Visit: Payer: Self-pay | Admitting: Internal Medicine

## 2019-01-24 ENCOUNTER — Other Ambulatory Visit: Payer: Self-pay | Admitting: Internal Medicine

## 2019-02-09 ENCOUNTER — Other Ambulatory Visit: Payer: Self-pay

## 2019-02-09 ENCOUNTER — Ambulatory Visit (INDEPENDENT_AMBULATORY_CARE_PROVIDER_SITE_OTHER): Payer: Medicare PPO | Admitting: Internal Medicine

## 2019-02-09 ENCOUNTER — Encounter: Payer: Self-pay | Admitting: Internal Medicine

## 2019-02-09 VITALS — BP 128/78 | HR 91 | Temp 98.1°F | Ht 65.8 in | Wt 152.6 lb

## 2019-02-09 DIAGNOSIS — I129 Hypertensive chronic kidney disease with stage 1 through stage 4 chronic kidney disease, or unspecified chronic kidney disease: Secondary | ICD-10-CM

## 2019-02-09 DIAGNOSIS — E1122 Type 2 diabetes mellitus with diabetic chronic kidney disease: Secondary | ICD-10-CM | POA: Diagnosis not present

## 2019-02-09 DIAGNOSIS — N183 Chronic kidney disease, stage 3 (moderate): Secondary | ICD-10-CM | POA: Diagnosis not present

## 2019-02-10 LAB — BMP8+EGFR
BUN/Creatinine Ratio: 12 (ref 12–28)
BUN: 16 mg/dL (ref 8–27)
CO2: 26 mmol/L (ref 20–29)
Calcium: 9.4 mg/dL (ref 8.7–10.3)
Chloride: 97 mmol/L (ref 96–106)
Creatinine, Ser: 1.34 mg/dL — ABNORMAL HIGH (ref 0.57–1.00)
GFR calc Af Amer: 42 mL/min/{1.73_m2} — ABNORMAL LOW (ref >59)
GFR calc non Af Amer: 36 mL/min/{1.73_m2} — ABNORMAL LOW (ref >59)
Glucose: 95 mg/dL (ref 65–99)
Potassium: 3.4 mmol/L — ABNORMAL LOW (ref 3.5–5.2)
Sodium: 138 mmol/L (ref 134–144)

## 2019-02-10 LAB — HEMOGLOBIN A1C
Est. average glucose Bld gHb Est-mCnc: 120 mg/dL
Hgb A1c MFr Bld: 5.8 % — ABNORMAL HIGH (ref 4.8–5.6)

## 2019-02-11 NOTE — Progress Notes (Signed)
Subjective:     Patient ID: Shannon Mcconnell , female    DOB: 1931-09-02 , 85 y.o.   MRN: [id number]   Chief Complaint  Patient presents with  . Hypertension    HPI  Hypertension This is a chronic problem. The current episode started more than 1 year ago. The problem has been gradually improving since onset. The problem is controlled. Pertinent negatives include no blurred vision, chest pain, palpitations or shortness of breath. Past treatments include calcium channel blockers, angiotensin blockers and diuretics. The current treatment provides moderate improvement. There are no compliance problems.  Hypertensive end-organ damage includes kidney disease.     Past Medical History:  Diagnosis Date  . DM (diabetes mellitus) ([place])   . High cholesterol   . Hypertension      Family History  Problem Relation Age of Onset  . Hypertension Mother   . Hypertension Father      Current Outpatient Medications:  .  amLODipine (NORVASC) 10 MG tablet, Take 1 tablet (10 mg total) by mouth daily., Disp: 90 tablet, Rfl: 1 .  blood glucose meter kit and supplies KIT, Dispense based on patient and insurance preference. Use up to four times daily as directed. (FOR ICD-9 250.00, 250.01)., Disp: 1 each, Rfl: 0 .  Cholecalciferol (VITAMIN D3) 50 MCG (2000 UT) capsule, TAKE 1 CAPSULE BY MOUTH TWICE DAILY **DISCONTINUE  VITAMIN  D2  50,000  IU**, Disp: 60 capsule, Rfl: 0 .  hydrochlorothiazide (HYDRODIURIL) 25 MG tablet, Take 1 tablet by mouth once daily (Patient taking differently: 1/2 tab daily), Disp: 90 tablet, Rfl: 0 .  olmesartan (BENICAR) 40 MG tablet, Take 1 tablet by mouth once daily, Disp: 90 tablet, Rfl: 0 .  pravastatin (PRAVACHOL) 20 MG tablet, Take 1 tablet (20 mg total) by mouth daily., Disp: 90 tablet, Rfl: 1 .  TRUE METRIX BLOOD GLUCOSE TEST test strip, USE AS DIRECTED TO CHECK BLOOD SUGAR TWICE DAILY, Disp: , Rfl: 99   No Known Allergies   Review of Systems  Constitutional: Negative.    Eyes: Negative for blurred vision.  Respiratory: Negative.  Negative for shortness of breath.   Cardiovascular: Negative.  Negative for chest pain and palpitations.  Gastrointestinal: Negative.   Neurological: Negative.   Psychiatric/Behavioral: Negative.      Today's Vitals   02/09/19 1503  BP: 128/78  Pulse: 91  Temp: 98.1 F (36.7 C)  TempSrc: Oral  SpO2: 98%  Weight: 152 lb 9.6 oz (69.2 kg)  Height: 5' 5.8" (1.671 m)   Body mass index is 24.78 kg/m.   Objective:  Physical Exam Vitals signs and nursing note reviewed.  Constitutional:      Appearance: Normal appearance.  HENT:     Head: Normocephalic and atraumatic.  Cardiovascular:     Rate and Rhythm: Normal rate and regular rhythm.     Heart sounds: Normal heart sounds.  Pulmonary:     Effort: Pulmonary effort is normal.     Breath sounds: Normal breath sounds.  Skin:    General: Skin is warm.  Neurological:     General: No focal deficit present.     Mental Status: She is alert.  Psychiatric:        Mood and Affect: Mood normal.        Behavior: Behavior normal.         Assessment And Plan:     1. Hypertensive nephropathy  Chronic, well controlled. She will continue with current meds. She is encouraged to avoid adding  salt to her foods.   2. Type 2 diabetes mellitus with stage 3 chronic kidney disease, without long-term current use of insulin (HCC)  Chronic. I will check labs as listed below. Importance of dietary compliance was discussed with the patient.   - BMP8+EGFR - Hemoglobin A1c        [name], MD    THE PATIENT IS ENCOURAGED TO PRACTICE SOCIAL DISTANCING DUE TO THE COVID-19 PANDEMIC.

## 2019-02-24 ENCOUNTER — Other Ambulatory Visit: Payer: Self-pay | Admitting: Internal Medicine

## 2019-03-01 ENCOUNTER — Other Ambulatory Visit: Payer: Self-pay | Admitting: Internal Medicine

## 2019-03-03 ENCOUNTER — Other Ambulatory Visit: Payer: Self-pay | Admitting: Internal Medicine

## 2019-03-04 ENCOUNTER — Other Ambulatory Visit: Payer: Self-pay | Admitting: Internal Medicine

## 2019-03-09 ENCOUNTER — Ambulatory Visit (INDEPENDENT_AMBULATORY_CARE_PROVIDER_SITE_OTHER): Payer: Medicare PPO

## 2019-03-09 ENCOUNTER — Other Ambulatory Visit: Payer: Self-pay

## 2019-03-09 VITALS — BP 128/82 | HR 74 | Temp 98.6°F

## 2019-03-09 DIAGNOSIS — Z23 Encounter for immunization: Secondary | ICD-10-CM | POA: Diagnosis not present

## 2019-03-09 NOTE — Progress Notes (Signed)
Pt presents today for flu shot 

## 2019-03-24 ENCOUNTER — Ambulatory Visit: Payer: Self-pay | Admitting: Internal Medicine

## 2019-05-19 ENCOUNTER — Other Ambulatory Visit: Payer: Self-pay

## 2019-05-19 ENCOUNTER — Encounter: Payer: Medicare PPO | Admitting: Internal Medicine

## 2019-05-19 MED ORDER — TRUE METRIX METER W/DEVICE KIT: PACK | 1 refills | Status: AC

## 2019-05-29 ENCOUNTER — Other Ambulatory Visit: Payer: Self-pay | Admitting: Internal Medicine

## 2019-05-30 ENCOUNTER — Other Ambulatory Visit: Payer: Self-pay | Admitting: Internal Medicine

## 2019-05-31 ENCOUNTER — Encounter: Payer: Medicare PPO | Admitting: Internal Medicine

## 2019-05-31 ENCOUNTER — Other Ambulatory Visit: Payer: Self-pay | Admitting: Internal Medicine

## 2019-05-31 ENCOUNTER — Ambulatory Visit: Payer: Medicare PPO

## 2019-06-01 ENCOUNTER — Ambulatory Visit: Payer: Self-pay

## 2019-06-01 ENCOUNTER — Ambulatory Visit: Payer: Self-pay | Admitting: Internal Medicine

## 2019-06-27 ENCOUNTER — Telehealth: Payer: Self-pay

## 2019-06-27 NOTE — Telephone Encounter (Signed)
The pt was notified that Dr. Baird Cancer said that it's ok for the pt to get the covid vaccination.  The pt had concerns because she wanted to know if she can get the covid vaccination with the medications that she is on.

## 2019-07-05 ENCOUNTER — Other Ambulatory Visit: Payer: Self-pay | Admitting: Internal Medicine

## 2019-07-07 ENCOUNTER — Ambulatory Visit (INDEPENDENT_AMBULATORY_CARE_PROVIDER_SITE_OTHER): Payer: Medicare PPO

## 2019-07-07 ENCOUNTER — Ambulatory Visit: Payer: Medicare PPO | Admitting: Internal Medicine

## 2019-07-07 ENCOUNTER — Encounter: Payer: Self-pay | Admitting: Internal Medicine

## 2019-07-07 ENCOUNTER — Other Ambulatory Visit: Payer: Self-pay

## 2019-07-07 VITALS — BP 122/82 | HR 102 | Temp 98.4°F | Ht 65.8 in | Wt 153.2 lb

## 2019-07-07 DIAGNOSIS — E1122 Type 2 diabetes mellitus with diabetic chronic kidney disease: Secondary | ICD-10-CM

## 2019-07-07 DIAGNOSIS — N183 Chronic kidney disease, stage 3 unspecified: Secondary | ICD-10-CM

## 2019-07-07 DIAGNOSIS — I129 Hypertensive chronic kidney disease with stage 1 through stage 4 chronic kidney disease, or unspecified chronic kidney disease: Secondary | ICD-10-CM | POA: Diagnosis not present

## 2019-07-07 DIAGNOSIS — Z6824 Body mass index (BMI) 24.0-24.9, adult: Secondary | ICD-10-CM

## 2019-07-07 DIAGNOSIS — E7800 Pure hypercholesterolemia, unspecified: Secondary | ICD-10-CM

## 2019-07-07 DIAGNOSIS — Z0000 Encounter for general adult medical examination without abnormal findings: Secondary | ICD-10-CM

## 2019-07-07 LAB — POCT URINALYSIS DIPSTICK
Bilirubin, UA: NEGATIVE
Glucose, UA: NEGATIVE
Ketones, UA: NEGATIVE
Nitrite, UA: NEGATIVE
Protein, UA: POSITIVE — AB
Spec Grav, UA: 1.025 (ref 1.010–1.025)
Urobilinogen, UA: 0.2 E.U./dL
pH, UA: 6 (ref 5.0–8.0)

## 2019-07-07 LAB — POCT UA - MICROALBUMIN
Creatinine, POC: 300 mg/dL
Microalbumin Ur, POC: 150 mg/L

## 2019-07-07 MED ORDER — HYDROCHLOROTHIAZIDE 12.5 MG PO CAPS: 12.5000 mg | ORAL_CAPSULE | Freq: Every day | ORAL | 2 refills | Status: DC

## 2019-07-07 MED ORDER — OLMESARTAN MEDOXOMIL 40 MG PO TABS: 40.0000 mg | ORAL_TABLET | Freq: Every day | ORAL | 2 refills | Status: DC

## 2019-07-07 NOTE — Patient Instructions (Signed)
[name] , Thank you for taking time to come for your Medicare Wellness Visit. I appreciate your ongoing commitment to your health goals. Please review the following plan we discussed and let me know if I can assist you in the future.   Screening recommendations/referrals: Colonoscopy: 06/2018 Mammogram: not required Bone Density: declined Recommended yearly ophthalmology/optometry visit for glaucoma screening and checkup Recommended yearly dental visit for hygiene and checkup  Vaccinations: Influenza vaccine: 02/2019 Pneumococcal vaccine: 01/2017 Tdap vaccine: 04/2011 Shingles vaccine: 12/2018    Advanced directives: Advance directive discussed with you today. I have provided a copy for you to complete at home and have notarized. Once this is complete please bring a copy in to our office so we can scan it into your chart.  Conditions/risks identified: HTN  Next appointment:    Preventive Care 84 Years and Older, Female Preventive care refers to lifestyle choices and visits with your health care provider that can promote health and wellness. What does preventive care include?  A yearly physical exam. This is also called an annual well check.  Dental exams once or twice a year.  Routine eye exams. Ask your health care provider how often you should have your eyes checked.  Personal lifestyle choices, including:  Daily care of your teeth and gums.  Regular physical activity.  Eating a healthy diet.  Avoiding tobacco and drug use.  Limiting alcohol use.  Practicing safe sex.  Taking low-dose aspirin every day.  Taking vitamin and mineral supplements as recommended by your health care provider. What happens during an annual well check? The services and screenings done by your health care provider during your annual well check will depend on your age, overall health, lifestyle risk factors, and family history of disease. Counseling  Your health care provider may ask  you questions about your:  Alcohol use.  Tobacco use.  Drug use.  Emotional well-being.  Home and relationship well-being.  Sexual activity.  Eating habits.  History of falls.  Memory and ability to understand (cognition).  Work and work Statistician.  Reproductive health. Screening  You may have the following tests or measurements:  Height, weight, and BMI.  Blood pressure.  Lipid and cholesterol levels. These may be checked every 5 years, or more frequently if you are over 60 years old.  Skin check.  Lung cancer screening. You may have this screening every year starting at age 54 if you have a 30-pack-year history of smoking and currently smoke or have quit within the past 15 years.  Fecal occult blood test (FOBT) of the stool. You may have this test every year starting at age 9.  Flexible sigmoidoscopy or colonoscopy. You may have a sigmoidoscopy every 5 years or a colonoscopy every 10 years starting at age 69.  Hepatitis C blood test.  Hepatitis B blood test.  Sexually transmitted disease (STD) testing.  Diabetes screening. This is done by checking your blood sugar (glucose) after you have not eaten for a while (fasting). You may have this done every 1-3 years.  Bone density scan. This is done to screen for osteoporosis. You may have this done starting at age 31.  Mammogram. This may be done every 1-2 years. Talk to your health care provider about how often you should have regular mammograms. Talk with your health care provider about your test results, treatment options, and if necessary, the need for more tests. Vaccines  Your health care provider may recommend certain vaccines, such as:  Influenza vaccine.  This is recommended every year.  Tetanus, diphtheria, and acellular pertussis (Tdap, Td) vaccine. You may need a Td booster every 10 years.  Zoster vaccine. You may need this after age 40.  Pneumococcal 13-valent conjugate (PCV13) vaccine. One dose  is recommended after age 41.  Pneumococcal polysaccharide (PPSV23) vaccine. One dose is recommended after age 31. Talk to your health care provider about which screenings and vaccines you need and how often you need them. This information is not intended to replace advice given to you by your health care provider. Make sure you discuss any questions you have with your health care provider. Document Released: 06/29/2015 Document Revised: 02/20/2016 Document Reviewed: 04/03/2015 Elsevier Interactive Patient Education  2017 [employer] Prevention in the Home Falls can cause injuries. They can happen to people of all ages. There are many things you can do to make your home safe and to help prevent falls. What can I do on the outside of my home?  Regularly fix the edges of walkways and driveways and fix any cracks.  Remove anything that might make you trip as you walk through a door, such as a raised step or threshold.  Trim any bushes or trees on the path to your home.  Use bright outdoor lighting.  Clear any walking paths of anything that might make someone trip, such as rocks or tools.  Regularly check to see if handrails are loose or broken. Make sure that both sides of any steps have handrails.  Any raised decks and porches should have guardrails on the edges.  Have any leaves, snow, or ice cleared regularly.  Use sand or salt on walking paths during winter.  Clean up any spills in your garage right away. This includes oil or grease spills. What can I do in the bathroom?  Use night lights.  Install grab bars by the toilet and in the tub and shower. Do not use towel bars as grab bars.  Use non-skid mats or decals in the tub or shower.  If you need to sit down in the shower, use a plastic, non-slip stool.  Keep the floor dry. Clean up any water that spills on the floor as soon as it happens.  Remove soap buildup in the tub or shower regularly.  Attach bath mats  securely with double-sided non-slip rug tape.  Do not have throw rugs and other things on the floor that can make you trip. What can I do in the bedroom?  Use night lights.  Make sure that you have a light by your bed that is easy to reach.  Do not use any sheets or blankets that are too big for your bed. They should not hang down onto the floor.  Have a firm chair that has side arms. You can use this for support while you get dressed.  Do not have throw rugs and other things on the floor that can make you trip. What can I do in the kitchen?  Clean up any spills right away.  Avoid walking on wet floors.  Keep items that you use a lot in easy-to-reach places.  If you need to reach something above you, use a strong step stool that has a grab bar.  Keep electrical cords out of the way.  Do not use floor polish or wax that makes floors slippery. If you must use wax, use non-skid floor wax.  Do not have throw rugs and other things on the floor that can make  you trip. What can I do with my stairs?  Do not leave any items on the stairs.  Make sure that there are handrails on both sides of the stairs and use them. Fix handrails that are broken or loose. Make sure that handrails are as long as the stairways.  Check any carpeting to make sure that it is firmly attached to the stairs. Fix any carpet that is loose or worn.  Avoid having throw rugs at the top or bottom of the stairs. If you do have throw rugs, attach them to the floor with carpet tape.  Make sure that you have a light switch at the top of the stairs and the bottom of the stairs. If you do not have them, ask someone to add them for you. What else can I do to help prevent falls?  Wear shoes that:  Do not have high heels.  Have rubber bottoms.  Are comfortable and fit you well.  Are closed at the toe. Do not wear sandals.  If you use a stepladder:  Make sure that it is fully opened. Do not climb a closed  stepladder.  Make sure that both sides of the stepladder are locked into place.  Ask someone to hold it for you, if possible.  Clearly mark and make sure that you can see:  Any grab bars or handrails.  First and last steps.  Where the edge of each step is.  Use tools that help you move around (mobility aids) if they are needed. These include:  Canes.  Walkers.  Scooters.  Crutches.  Turn on the lights when you go into a dark area. Replace any light bulbs as soon as they burn out.  Set up your furniture so you have a clear path. Avoid moving your furniture around.  If any of your floors are uneven, fix them.  If there are any pets around you, be aware of where they are.  Review your medicines with your doctor. Some medicines can make you feel dizzy. This can increase your chance of falling. Ask your doctor what other things that you can do to help prevent falls. This information is not intended to replace advice given to you by your health care provider. Make sure you discuss any questions you have with your health care provider. Document Released: 03/29/2009 Document Revised: 11/08/2015 Document Reviewed: 07/07/2014 Elsevier Interactive Patient Education  2017 [employer].

## 2019-07-07 NOTE — Addendum Note (Signed)
Addended by: Kellie Simmering on: 07/07/2019 12:28 PM   Modules accepted: Orders

## 2019-07-07 NOTE — Patient Instructions (Signed)
Exercises To Do While Sitting  Exercises that you do while sitting (chair exercises) can give you many of the same benefits as full exercise. Benefits include strengthening your heart, burning calories, and keeping muscles and joints healthy. Exercise can also improve your mood and help with depression and anxiety. You may benefit from chair exercises if you are unable to do standing exercises because of:  Diabetic foot pain.  Obesity.  Illness.  Arthritis.  Recovery from surgery or injury.  Breathing problems.  Balance problems.  Another type of disability. Before starting chair exercises, check with your health care provider or a physical therapist to find out how much exercise you can tolerate and which exercises are safe for you. If your health care provider approves:  Start out slowly and build up over time. Aim to work up to about 10-20 minutes for each exercise session.  Make exercise part of your daily routine.  Drink water when you exercise. Do not wait until you are thirsty. Drink every 10-15 minutes.  Stop exercising right away if you have pain, nausea, shortness of breath, or dizziness.  If you are exercising in a wheelchair, make sure to lock the wheels.  Ask your health care provider whether you can do tai chi or yoga. Many positions in these mind-body exercises can be modified to do while seated. Warm-up Before starting other exercises: 1. Sit up as straight as you can. Have your knees bent at 90 degrees, which is the shape of the capital letter "L." Keep your feet flat on the floor. 2. Sit at the front edge of your chair, if you can. 3. Pull in (tighten) the muscles in your abdomen and stretch your spine and neck as straight as you can. Hold this position for a few minutes. 4. Breathe in and out evenly. Try to concentrate on your breathing, and relax your mind. Stretching Exercise A: Arm stretch 1. Hold your arms out straight in front of your body. 2. Bend  your hands at the wrist with your fingers pointing up, as if signaling someone to stop. Notice the slight tension in your forearms as you hold the position. 3. Keeping your arms out and your hands bent, rotate your hands outward as far as you can and hold this stretch. Aim to have your thumbs pointing up and your pinkie fingers pointing down. Slowly repeat arm stretches for one minute as tolerated. Exercise B: Leg stretch 1. If you can move your legs, try to "draw" letters on the floor with the toes of your foot. Write your name with one foot. 2. Write your name with the toes of your other foot. Slowly repeat the movements for one minute as tolerated. Exercise C: Reach for the sky 1. Reach your hands as far over your head as you can to stretch your spine. 2. Move your hands and arms as if you are climbing a rope. Slowly repeat the movements for one minute as tolerated. Range of motion exercises Exercise A: Shoulder roll 1. Let your arms hang loosely at your sides. 2. Lift just your shoulders up toward your ears, then let them relax back down. 3. When your shoulders feel loose, rotate your shoulders in backward and forward circles. Do shoulder rolls slowly for one minute as tolerated. Exercise B: March in place 1. As if you are marching, pump your arms and lift your legs up and down. Lift your knees as high as you can. ? If you are unable to lift your knees,  just pump your arms and move your ankles and feet up and down. March in place for one minute as tolerated. Exercise C: Seated jumping jacks 1. Let your arms hang down straight. 2. Keeping your arms straight, lift them up over your head. Aim to point your fingers to the ceiling. 3. While you lift your arms, straighten your legs and slide your heels along the floor to your sides, as wide as you can. 4. As you bring your arms back down to your sides, slide your legs back together. ? If you are unable to use your legs, just move your  arms. Slowly repeat seated jumping jacks for one minute as tolerated. Strengthening exercises Exercise A: Shoulder squeeze 1. Hold your arms straight out from your body to your sides, with your elbows bent and your fists pointed at the ceiling. 2. Keeping your arms in the bent position, move them forward so your elbows and forearms meet in front of your face. 3. Open your arms back out as wide as you can with your elbows still bent, until you feel your shoulder blades squeezing together. Hold for 5 seconds. Slowly repeat the movements forward and backward for one minute as tolerated. Contact a health care provider if you:  Had to stop exercising due to any of the following: ? Pain. ? Nausea. ? Shortness of breath. ? Dizziness. ? Fatigue.  Have significant pain or soreness after exercising. Get help right away if you have:  Chest pain.  Difficulty breathing. These symptoms may represent a serious problem that is an emergency. Do not wait to see if the symptoms will go away. Get medical help right away. Call your local emergency services (911 in the [place] Do not drive yourself to the hospital. This information is not intended to replace advice given to you by your health care provider. Make sure you discuss any questions you have with your health care provider. Document Revised: 09/23/2018 Document Reviewed: 04/15/2017 Elsevier Patient Education  2020 [employer].

## 2019-07-07 NOTE — Progress Notes (Signed)
This visit occurred during the SARS-CoV-2 public health emergency.  Safety protocols were in place, including screening questions prior to the visit, additional usage of staff PPE, and extensive cleaning of exam room while observing appropriate contact time as indicated for disinfecting solutions.  Subjective:   Mariafernanda Hendricksen is a 86 y.o. female who presents for Medicare Annual (Subsequent) preventive examination.  Review of Systems:  n/a Cardiac Risk Factors include: advanced age (>23mn, >>91women);diabetes mellitus;hypertension;sedentary lifestyle     Objective:     Vitals: BP 122/82 (BP Location: Right Arm, Patient Position: Sitting, Cuff Size: Normal)   Pulse (!) 102   Temp 98.4 F (36.9 C) (Oral)   Ht 5' 5.8" (1.671 m)   Wt 153 lb 3.2 oz (69.5 kg)   SpO2 99%   BMI 24.88 kg/m   Body mass index is 24.88 kg/m.  Advanced Directives 07/07/2019 05/26/2018  Does Patient Have a Medical Advance Directive? No No  Would patient like information on creating a medical advance directive? Yes (MAU/Ambulatory/Procedural Areas - Information given) -    Tobacco Social History   Tobacco Use  Smoking Status Never Smoker  Smokeless Tobacco Never Used     Counseling given: Not Answered   Clinical Intake:  Pre-visit preparation completed: Yes  Pain : No/denies pain     Nutritional Status: BMI of 19-24  Normal Nutritional Risks: None Diabetes: Yes  How often do you need to have someone help you when you read instructions, pamphlets, or other written materials from your doctor or pharmacy?: 1 - Never What is the last grade level you completed in school?: 10th grade  Interpreter Needed?: No  Information entered by :: NAllen LPN  Past Medical History:  Diagnosis Date  . DM (diabetes mellitus) [place]   . High cholesterol   . Hypertension    Past Surgical History:  Procedure Laterality Date  . ABDOMINAL HYSTERECTOMY    . mastectomy Left    Family History  Problem  Relation Age of Onset  . Hypertension Mother   . Hypertension Father    Social History   Socioeconomic History  . Marital status: Divorced    Spouse name: Not on file  . Number of children: Not on file  . Years of education: Not on file  . Highest education level: Not on file  Occupational History  . Occupation: retired  Tobacco Use  . Smoking status: Never Smoker  . Smokeless tobacco: Never Used  Substance and Sexual Activity  . Alcohol use: Never  . Drug use: Never  . Sexual activity: Not Currently  Other Topics Concern  . Not on file  Social History Narrative  . Not on file   Social Determinants of Health   Financial Resource Strain: Low Risk   . Difficulty of Paying Living Expenses: Not hard at all  Food Insecurity: No Food Insecurity  . Worried About RCharity fundraiserin the Last Year: Never true  . Ran Out of Food in the Last Year: Never true  Transportation Needs: No Transportation Needs  . Lack of Transportation (Medical): No  . Lack of Transportation (Non-Medical): No  Physical Activity: Inactive  . Days of Exercise per Week: 0 days  . Minutes of Exercise per Session: 0 min  Stress: No Stress Concern Present  . Feeling of Stress : Not at all  Social Connections:   . Frequency of Communication with Friends and Family: Not on file  . Frequency of Social Gatherings with Friends and Family: Not  on file  . Attends Religious Services: Not on file  . Active Member of Clubs or Organizations: Not on file  . Attends Archivist Meetings: Not on file  . Marital Status: Not on file    Outpatient Encounter Medications as of 07/07/2019  Medication Sig  . amLODipine (NORVASC) 10 MG tablet Take 1 tablet by mouth once daily  . Blood Glucose Monitoring Suppl (TRUE METRIX METER) w/Device KIT USE AS DIRECTED TO CHECK BLOOD SUGAR TWICE DAILY DX: E11.65  . Cholecalciferol (VITAMIN D3) 50 MCG (2000 UT) capsule TAKE 1 CAPSULE BY MOUTH TWICE DAILY **DISCONTINUE   VITAMIN  D2  50,000  IU**  . hydrochlorothiazide (HYDRODIURIL) 25 MG tablet Take 1 tablet by mouth once daily  . olmesartan (BENICAR) 40 MG tablet Take 1 tablet by mouth once daily  . pravastatin (PRAVACHOL) 20 MG tablet Take 1 tablet by mouth once daily  . TRUE METRIX BLOOD GLUCOSE TEST test strip USE AS DIRECTED TO CHECK BLOOD SUGAR TWICE DAILY   No facility-administered encounter medications on file as of 07/07/2019.    Activities of Daily Living In your present state of health, do you have any difficulty performing the following activities: 07/07/2019  Hearing? N  Vision? N  Difficulty concentrating or making decisions? N  Walking or climbing stairs? Y  Comment due to knees  Dressing or bathing? N  Doing errands, shopping? N  Preparing Food and eating ? N  Using the Toilet? N  In the past six months, have you accidently leaked urine? N  Do you have problems with loss of bowel control? N  Managing your Medications? N  Managing your Finances? N  Housekeeping or managing your Housekeeping? N  Some recent data might be hidden    Patient Care Team: Glendale Chard, MD as PCP - General (Internal Medicine)    Assessment:   This is a routine wellness examination for Gerrianne.  Exercise Activities and Dietary recommendations Current Exercise Habits: The patient does not participate in regular exercise at present  Goals    . Exercise 150 min/wk Moderate Activity     07/07/2019, wants to start exercising regularly       Fall Risk Fall Risk  07/07/2019 02/09/2019 11/22/2018 08/26/2018 07/02/2018  Falls in the past year? 0 0 0 0 0  Risk for fall due to : Medication side effect - - - -  Follow up Falls evaluation completed;Education provided;Falls prevention discussed - - - -   Is the patient's home free of loose throw rugs in walkways, pet beds, electrical cords, etc?   yes      Grab bars in the bathroom? yes      Handrails on the stairs?   n/a      Adequate lighting?   yes  Timed  Get Up and Go performed: n/a  Depression Screen PHQ 2/9 Scores 07/07/2019 11/22/2018 08/26/2018 07/02/2018  PHQ - 2 Score 0 0 0 0  PHQ- 9 Score 0 - - -     Cognitive Function MMSE - Mini Mental State Exam 05/26/2018  Orientation to Place 5  Attention/ Calculation 5     6CIT Screen 07/07/2019  What Year? 0 points  What month? 0 points  What time? 0 points  Count back from 20 0 points  Months in reverse 4 points  Repeat phrase 0 points  Total Score 4    Immunization History  Administered Date(s) Administered  . Influenza, High Dose Seasonal PF 04/11/2018, 03/09/2019  . Pneumococcal  Conjugate-13 10/17/2013, 02/04/2017  . Zoster Recombinat (Shingrix) 01/08/2019    Qualifies for Shingles Vaccine? yes  Screening Tests Health Maintenance  Topic Date Due  . DEXA SCAN  06/26/1998  . OPHTHALMOLOGY EXAM  11/15/2018  . FOOT EXAM  05/28/2019  . HEMOGLOBIN A1C  08/12/2019  . TETANUS/TDAP  04/23/2021  . INFLUENZA VACCINE  Completed  . PNA vac Low Risk Adult  Completed    Cancer Screenings: Lung: Low Dose CT Chest recommended if Age 14-80 years, 30 pack-year currently smoking OR have quit w/in 15years. Patient does not qualify. Breast:  Up to date on Mammogram? Yes   Up to date of Bone Density/Dexa? Yes Colorectal: up to date  Additional Screenings: : Hepatitis C Screening: n/a     Plan:    Patient wants to start exercising regularly. Has to take it slow due to her knees.   I have personally reviewed and noted the following in the patient's chart:   . Medical and social history . Use of alcohol, tobacco or illicit drugs  . Current medications and supplements . Functional ability and status . Nutritional status . Physical activity . Advanced directives . List of other physicians . Hospitalizations, surgeries, and ER visits in previous 12 months . Vitals . Screenings to include cognitive, depression, and falls . Referrals and appointments  In addition, I have reviewed  and discussed with patient certain preventive protocols, quality metrics, and best practice recommendations. A written personalized care plan for preventive services as well as general preventive health recommendations were provided to patient.     Kellie Simmering, LPN  [id number]

## 2019-07-08 LAB — CBC
Hematocrit: 38.2 % (ref 34.0–46.6)
Hemoglobin: 12.3 g/dL (ref 11.1–15.9)
MCH: 29.5 pg (ref 26.6–33.0)
MCHC: 32.2 g/dL (ref 31.5–35.7)
MCV: 92 fL (ref 79–97)
Platelets: 278 10*3/uL (ref 150–450)
RBC: 4.17 x10E6/uL (ref 3.77–5.28)
RDW: 13.2 % (ref 11.7–15.4)
WBC: 5 10*3/uL (ref 3.4–10.8)

## 2019-07-08 LAB — HEMOGLOBIN A1C
Est. average glucose Bld gHb Est-mCnc: 120 mg/dL
Hgb A1c MFr Bld: 5.8 % — ABNORMAL HIGH (ref 4.8–5.6)

## 2019-07-08 LAB — CMP14+EGFR
ALT: 9 IU/L (ref 0–32)
AST: 19 IU/L (ref 0–40)
Albumin/Globulin Ratio: 1.3 (ref 1.2–2.2)
Albumin: 4 g/dL (ref 3.6–4.6)
Alkaline Phosphatase: 61 IU/L (ref 39–117)
BUN/Creatinine Ratio: 15 (ref 12–28)
BUN: 20 mg/dL (ref 8–27)
Bilirubin Total: 0.5 mg/dL (ref 0.0–1.2)
CO2: 27 mmol/L (ref 20–29)
Calcium: 9.5 mg/dL (ref 8.7–10.3)
Chloride: 96 mmol/L (ref 96–106)
Creatinine, Ser: 1.36 mg/dL — ABNORMAL HIGH (ref 0.57–1.00)
GFR calc Af Amer: 41 mL/min/{1.73_m2} — ABNORMAL LOW (ref >59)
GFR calc non Af Amer: 35 mL/min/{1.73_m2} — ABNORMAL LOW (ref >59)
Globulin, Total: 3 g/dL (ref 1.5–4.5)
Glucose: 101 mg/dL — ABNORMAL HIGH (ref 65–99)
Potassium: 3.7 mmol/L (ref 3.5–5.2)
Sodium: 136 mmol/L (ref 134–144)
Total Protein: 7 g/dL (ref 6.0–8.5)

## 2019-07-08 LAB — LIPID PANEL
Chol/HDL Ratio: 2.8 ratio (ref 0.0–4.4)
Cholesterol, Total: 183 mg/dL (ref 100–199)
HDL: 66 mg/dL (ref >39)
LDL Chol Calc (NIH): 100 mg/dL — ABNORMAL HIGH (ref 0–99)
Triglycerides: 94 mg/dL (ref 0–149)
VLDL Cholesterol Cal: 17 mg/dL (ref 5–40)

## 2019-07-09 NOTE — Progress Notes (Signed)
This visit occurred during the SARS-CoV-2 public health emergency.  Safety protocols were in place, including screening questions prior to the visit, additional usage of staff PPE, and extensive cleaning of exam room while observing appropriate contact time as indicated for disinfecting solutions.  Subjective:     Patient ID: Shannon Mcconnell , female    DOB: 1931/06/18 , 86 y.o.   MRN: [id number]   Chief Complaint  Patient presents with  . Hypertension    HPI  She is here today for BP and DM check. She reports compliance with BP meds. She is no longer on diabetes meds. She has been controlling this with diet/exercise. She tries to stay active in her home, does not feel comfortable walking in her neighborhood.   Hypertension This is a chronic problem. The current episode started more than 1 year ago. The problem has been gradually improving since onset. The problem is controlled. Pertinent negatives include no blurred vision or palpitations. Risk factors for coronary artery disease include diabetes mellitus, dyslipidemia, post-menopausal state and sedentary lifestyle. Past treatments include angiotensin blockers, diuretics and ACE inhibitors. The current treatment provides moderate improvement. Compliance problems include exercise.  Hypertensive end-organ damage includes kidney disease.     Past Medical History:  Diagnosis Date  . DM (diabetes mellitus) ([place])   . High cholesterol   . Hypertension      Family History  Problem Relation Age of Onset  . Hypertension Mother   . Hypertension Father      Current Outpatient Medications:  .  amLODipine (NORVASC) 10 MG tablet, Take 1 tablet by mouth once daily, Disp: 90 tablet, Rfl: 0 .  Blood Glucose Monitoring Suppl (TRUE METRIX METER) w/Device KIT, USE AS DIRECTED TO CHECK BLOOD SUGAR TWICE DAILY DX: E11.65, Disp: 1 kit, Rfl: 1 .  Cholecalciferol (VITAMIN D3) 50 MCG (2000 UT) capsule, TAKE 1 CAPSULE BY MOUTH TWICE DAILY **DISCONTINUE   VITAMIN  D2  50,000  IU**, Disp: 60 capsule, Rfl: 0 .  hydrochlorothiazide (MICROZIDE) 12.5 MG capsule, Take 1 capsule (12.5 mg total) by mouth daily. Patient does not need right now, please keep on file and d/c 72m dose, Disp: 90 capsule, Rfl: 2 .  olmesartan (BENICAR) 40 MG tablet, Take 1 tablet (40 mg total) by mouth daily., Disp: 90 tablet, Rfl: 2 .  pravastatin (PRAVACHOL) 20 MG tablet, Take 1 tablet by mouth once daily, Disp: 90 tablet, Rfl: 0 .  TRUE METRIX BLOOD GLUCOSE TEST test strip, USE AS DIRECTED TO CHECK BLOOD SUGAR TWICE DAILY, Disp: 50 each, Rfl: 0   No Known Allergies   Review of Systems  Constitutional: Negative.   Eyes: Negative for blurred vision.  Respiratory: Negative.   Cardiovascular: Negative.  Negative for palpitations.  Gastrointestinal: Negative.   Neurological: Negative.   Psychiatric/Behavioral: Negative.      Today's Vitals   07/07/19 1147  BP: 122/82  Pulse: (!) 102  Temp: 98.4 F (36.9 C)  TempSrc: Oral  Weight: 153 lb 3.2 oz (69.5 kg)  Height: 5' 5.8" (1.671 m)   Body mass index is 24.88 kg/m.   Objective:  Physical Exam Vitals and nursing note reviewed.  Constitutional:      Appearance: Normal appearance.  HENT:     Head: Normocephalic and atraumatic.  Cardiovascular:     Rate and Rhythm: Normal rate and regular rhythm.     Heart sounds: Normal heart sounds.  Pulmonary:     Effort: Pulmonary effort is normal.     Breath sounds:  Normal breath sounds.  Skin:    General: Skin is warm.  Neurological:     General: No focal deficit present.     Mental Status: She is alert.  Psychiatric:        Mood and Affect: Mood normal.        Behavior: Behavior normal.         Assessment And Plan:     1. Hypertensive nephropathy  Chronic, well controlled. She will continue with current meds. She is encouraged to avoid adding salt to her foods. She will rto in six months for re-evaluation.   2. Type 2 diabetes mellitus with stage 3  chronic kidney disease, without long-term current use of insulin, unspecified whether stage 3a or 3b CKD ([place])  This has been stable, she is now off of meds.  I will check labs as listed below. She will rto in six months for re-evaluation.   - CMP14+EGFR - CBC - Hemoglobin A1c  3. Pure hypercholesterolemia  Chronic. I will check fasting lipid panel and LFTs today. She is encouraged to limit her fried food intake.   - Lipid panel  4. Body mass index (BMI) of 24.0-24.9 in adult  Her weight is stable.   [name], MD    THE PATIENT IS ENCOURAGED TO PRACTICE SOCIAL DISTANCING DUE TO THE COVID-19 PANDEMIC.

## 2019-07-12 ENCOUNTER — Telehealth: Payer: Self-pay

## 2019-07-12 NOTE — Telephone Encounter (Signed)
-----   Message from Glendale Chard, MD sent at 07/09/2019  9:52 AM EST ----- Kidney fxn is stable. Be sure to stay well hydrated. Blood count is normal. LDL, bad chol is 100.  Continue with current meds. A1c is 5.8, this is stable. She is doing great!

## 2019-07-12 NOTE — Telephone Encounter (Signed)
1st attempt to give lab results  

## 2019-08-18 ENCOUNTER — Other Ambulatory Visit: Payer: Self-pay | Admitting: Internal Medicine

## 2019-10-08 DIAGNOSIS — H40033 Anatomical narrow angle, bilateral: Secondary | ICD-10-CM | POA: Diagnosis not present

## 2019-10-08 DIAGNOSIS — E119 Type 2 diabetes mellitus without complications: Secondary | ICD-10-CM | POA: Diagnosis not present

## 2019-10-09 ENCOUNTER — Other Ambulatory Visit: Payer: Self-pay | Admitting: Internal Medicine

## 2019-12-03 ENCOUNTER — Other Ambulatory Visit: Payer: Self-pay | Admitting: Internal Medicine

## 2019-12-05 ENCOUNTER — Other Ambulatory Visit: Payer: Self-pay | Admitting: Internal Medicine

## 2019-12-06 ENCOUNTER — Other Ambulatory Visit: Payer: Self-pay

## 2020-01-09 ENCOUNTER — Ambulatory Visit: Payer: Medicare PPO | Admitting: Internal Medicine

## 2020-01-09 ENCOUNTER — Encounter: Payer: Self-pay | Admitting: Internal Medicine

## 2020-01-09 ENCOUNTER — Other Ambulatory Visit: Payer: Self-pay

## 2020-01-09 VITALS — BP 114/80 | HR 96 | Temp 98.0°F | Ht 65.4 in | Wt 152.0 lb

## 2020-01-09 DIAGNOSIS — E1122 Type 2 diabetes mellitus with diabetic chronic kidney disease: Secondary | ICD-10-CM | POA: Diagnosis not present

## 2020-01-09 DIAGNOSIS — Z6824 Body mass index (BMI) 24.0-24.9, adult: Secondary | ICD-10-CM | POA: Diagnosis not present

## 2020-01-09 DIAGNOSIS — N183 Chronic kidney disease, stage 3 unspecified: Secondary | ICD-10-CM

## 2020-01-09 DIAGNOSIS — I129 Hypertensive chronic kidney disease with stage 1 through stage 4 chronic kidney disease, or unspecified chronic kidney disease: Secondary | ICD-10-CM | POA: Diagnosis not present

## 2020-01-09 DIAGNOSIS — Z0000 Encounter for general adult medical examination without abnormal findings: Secondary | ICD-10-CM

## 2020-01-09 DIAGNOSIS — M1711 Unilateral primary osteoarthritis, right knee: Secondary | ICD-10-CM

## 2020-01-09 DIAGNOSIS — E7800 Pure hypercholesterolemia, unspecified: Secondary | ICD-10-CM | POA: Diagnosis not present

## 2020-01-09 DIAGNOSIS — E785 Hyperlipidemia, unspecified: Secondary | ICD-10-CM | POA: Diagnosis not present

## 2020-01-09 DIAGNOSIS — E559 Vitamin D deficiency, unspecified: Secondary | ICD-10-CM | POA: Diagnosis not present

## 2020-01-09 LAB — POCT URINALYSIS DIPSTICK
Bilirubin, UA: NEGATIVE
Blood, UA: NEGATIVE
Glucose, UA: NEGATIVE
Ketones, UA: NEGATIVE
Nitrite, UA: NEGATIVE
Protein, UA: POSITIVE — AB
Spec Grav, UA: 1.01 (ref 1.010–1.025)
Urobilinogen, UA: 0.2 E.U./dL
pH, UA: 6 (ref 5.0–8.0)

## 2020-01-09 LAB — POCT UA - MICROALBUMIN
Albumin/Creatinine Ratio, Urine, POC: >300
Creatinine, POC: 50 mg/dL
Microalbumin Ur, POC: 150 mg/L

## 2020-01-09 MED ORDER — AMLODIPINE BESYLATE 10 MG PO TABS: 10.0000 mg | ORAL_TABLET | Freq: Every day | ORAL | 2 refills | Status: DC

## 2020-01-09 MED ORDER — HYDROCHLOROTHIAZIDE 12.5 MG PO CAPS: 12.5000 mg | ORAL_CAPSULE | Freq: Every day | ORAL | 2 refills | Status: DC

## 2020-01-09 MED ORDER — OLMESARTAN MEDOXOMIL 40 MG PO TABS: 40.0000 mg | ORAL_TABLET | Freq: Every day | ORAL | 2 refills | Status: DC

## 2020-01-09 MED ORDER — PRAVASTATIN SODIUM 20 MG PO TABS: 20.0000 mg | ORAL_TABLET | Freq: Every day | ORAL | 2 refills | Status: DC

## 2020-01-09 NOTE — Progress Notes (Signed)
I,[name],acting as a Education administrator for [name], MD.,have documented all relevant documentation on the behalf of [name], MD,as directed by  [name], MD while in the presence of [name], MD.  This visit occurred during the SARS-CoV-2 public health emergency.  Safety protocols were in place, including screening questions prior to the visit, additional usage of staff PPE, and extensive cleaning of exam room while observing appropriate contact time as indicated for disinfecting solutions.  Subjective:     Patient ID: Shannon Mcconnell , female    DOB: [birth year] , 86 y.o.   MRN: [id number]   Chief Complaint  Patient presents with  . Annual Exam  . Hypertension  . Diabetes    HPI  The patient is here for a physical exam.  She is no longer followed by GYN. She has no specific concerns or complaints at this time. Hypertension This is a chronic problem. The current episode started more than 1 year ago. The problem has been gradually improving since onset. The problem is controlled. Pertinent negatives include no blurred vision, chest pain, palpitations or shortness of breath. Past treatments include calcium channel blockers, angiotensin blockers and diuretics. The current treatment provides moderate improvement. There are no compliance problems.  Hypertensive end-organ damage includes kidney disease.  Diabetes She presents for her follow-up diabetic visit. She has type 2 diabetes mellitus. Pertinent negatives for diabetes include no blurred vision and no chest pain.     Past Medical History:  Diagnosis Date  . DM (diabetes mellitus) ([place])   . High cholesterol   . Hypertension      Family History  Problem Relation Age of Onset  . Hypertension Mother   . Hypertension Father      Current Outpatient Medications:  .  Blood Glucose Monitoring Suppl (TRUE METRIX METER) w/Device KIT, USE AS DIRECTED TO CHECK BLOOD SUGAR TWICE DAILY DX: E11.65, Disp: 1 kit,  Rfl: 1 .  Cholecalciferol (VITAMIN D3) 50 MCG (2000 UT) capsule, TAKE 1 CAPSULE BY MOUTH TWICE DAILY **DISCONTINUE  VITAMIN  D2  50,000  IU**, Disp: 60 capsule, Rfl: 0 .  hydrochlorothiazide (MICROZIDE) 12.5 MG capsule, Take 1 capsule (12.5 mg total) by mouth daily. Patient does not need right now, please keep on file and d/c 45m dose, Disp: 90 capsule, Rfl: 2 .  olmesartan (BENICAR) 40 MG tablet, Take 1 tablet (40 mg total) by mouth daily., Disp: 90 tablet, Rfl: 2 .  pravastatin (PRAVACHOL) 20 MG tablet, Take 1 tablet (20 mg total) by mouth daily., Disp: 90 tablet, Rfl: 2 .  TRUE METRIX BLOOD GLUCOSE TEST test strip, USE AS DIRECTED TO CHECK BLOOD SUGAR TWICE DAILY, Disp: 50 each, Rfl: 4 .  amLODipine (NORVASC) 10 MG tablet, Take 1 tablet (10 mg total) by mouth daily., Disp: 90 tablet, Rfl: 2   No Known Allergies    The patient states she uses none for birth control. Last LMP was No LMP recorded. Patient has had a hysterectomy.. Negative for Dysmenorrhea. Negative for: breast discharge, breast lump(s), breast pain and breast self exam. Associated symptoms include abnormal vaginal bleeding. Pertinent negatives include abnormal bleeding (hematology), anxiety, decreased libido, depression, difficulty falling sleep, dyspareunia, history of infertility, nocturia, sexual dysfunction, sleep disturbances, urinary incontinence, urinary urgency, vaginal discharge and vaginal itching. Diet regular.The patient states her exercise level is  intermittent.   . The patient's tobacco use is:  Social History   Tobacco Use  Smoking Status Never Smoker  Smokeless Tobacco Never  Used  . She has been exposed to passive smoke. The patient's alcohol use is:  Social History   Substance and Sexual Activity  Alcohol Use Never    Review of Systems  Constitutional: Negative.   HENT: Negative.   Eyes: Negative.  Negative for blurred vision.  Respiratory: Negative.  Negative for shortness of breath.    Cardiovascular: Negative.  Negative for chest pain and palpitations.  Gastrointestinal: Negative.   Endocrine: Negative.   Genitourinary: Negative.   Musculoskeletal: Positive for arthralgias.       She c/o knee pain. There is some pain with ambulation. Right knee more than left knee.   Skin: Negative.   Allergic/Immunologic: Negative.   Neurological: Negative.   Hematological: Negative.   Psychiatric/Behavioral: Negative.      Today's Vitals   01/09/20 1049  BP: 114/80  Pulse: 96  Temp: 98 F (36.7 C)  TempSrc: Oral  Weight: 152 lb (68.9 kg)  Height: 5' 5.4" (1.661 m)  PainSc: 0-No pain   Body mass index is 24.99 kg/m.  Wt Readings from Last 3 Encounters:  01/09/20 152 lb (68.9 kg)  07/07/19 153 lb 3.2 oz (69.5 kg)  07/07/19 153 lb 3.2 oz (69.5 kg)   Objective:  Physical Exam Constitutional:      General: She is not in acute distress.    Appearance: Normal appearance. She is well-developed. She is obese.  HENT:     Head: Normocephalic and atraumatic.     Right Ear: Hearing, tympanic membrane, ear canal and external ear normal. There is no impacted cerumen.     Left Ear: Hearing, tympanic membrane, ear canal and external ear normal. There is no impacted cerumen.     Nose: Nose normal.     Mouth/Throat:     Mouth: Mucous membranes are dry.  Eyes:     General: Lids are normal.     Extraocular Movements: Extraocular movements intact.     Conjunctiva/sclera: Conjunctivae normal.     Pupils: Pupils are equal, round, and reactive to light.     Funduscopic exam:    Right eye: No papilledema.        Left eye: No papilledema.  Neck:     Thyroid: No thyroid mass.     Vascular: No carotid bruit.  Cardiovascular:     Rate and Rhythm: Normal rate and regular rhythm.     Pulses: Normal pulses.          Dorsalis pedis pulses are 2+ on the right side and 2+ on the left side.     Heart sounds: Normal heart sounds. No murmur heard.   Pulmonary:     Effort: Pulmonary  effort is normal.     Breath sounds: Normal breath sounds.  Chest:     Breasts: Tanner Score is 5.        Right: Normal.        Left: Absent.     Comments: S/p Left mastectomy. Healed surgical scar Abdominal:     General: Abdomen is flat. Bowel sounds are normal. There is no distension.     Palpations: Abdomen is soft.     Tenderness: There is no abdominal tenderness.  Genitourinary:    Rectum: Guaiac result negative.  Musculoskeletal:        General: No swelling. Normal range of motion.     Cervical back: Full passive range of motion without pain, normal range of motion and neck supple.     Right lower leg: No edema.  Left lower leg: No edema.  Feet:     Right foot:     Protective Sensation: 5 sites tested. 5 sites sensed.     Skin integrity: Skin integrity normal.     Toenail Condition: Right toenails are normal.     Left foot:     Protective Sensation: 5 sites tested. 5 sites sensed.     Skin integrity: Skin integrity normal.     Toenail Condition: Left toenails are normal.  Skin:    General: Skin is warm and dry.     Capillary Refill: Capillary refill takes less than 2 seconds.  Neurological:     General: No focal deficit present.     Mental Status: She is alert and oriented to person, place, and time.     Cranial Nerves: No cranial nerve deficit.     Sensory: No sensory deficit.  Psychiatric:        Mood and Affect: Mood normal.        Behavior: Behavior normal.        Thought Content: Thought content normal.        Judgment: Judgment normal.         Assessment And Plan:v     1. Routine general medical examination at a health care facility  Comments: A full exam was performed.  Importance of monthly self breast exams was discussed with the patient. PATIENT IS ADVISED TO GET 30-45 MINUTES REGULAR EXERCISE NO LESS THAN FOUR TO FIVE DAYS PER WEEK - BOTH WEIGHTBEARING EXERCISES AND AEROBIC ARE RECOMMENDED.  PATIENT IS ADVISED TO FOLLOW A HEALTHY DIET WITH AT LEAST  SIX FRUITS/VEGGIES PER DAY, DECREASE INTAKE OF RED MEAT, AND TO INCREASE FISH INTAKE TO TWO DAYS PER WEEK.  MEATS/FISH SHOULD NOT BE FRIED, BAKED OR BROILED IS PREFERABLE.  I SUGGEST WEARING SPF 50 SUNSCREEN ON EXPOSED PARTS AND ESPECIALLY WHEN IN THE DIRECT SUNLIGHT FOR AN EXTENDED PERIOD OF TIME.  PLEASE AVOID FAST FOOD RESTAURANTS AND INCREASE YOUR WATER INTAKE.   2. Hypertensive nephropathy  Comments: Chronic, well controlled. She will continue with current meds. She is encouraged to avoid adding salt to her foods. EKG performed, NSR w/o LAFB, LVH - no new changes noted. She will rto in six months for re-evaluation.   - CBC - CMP14+EGFR - EKG 12-Lead - POCT Urinalysis Dipstick (81002)  3. Type 2 diabetes mellitus with stage 3 chronic kidney disease, without long-term current use of insulin, unspecified whether stage 3a or 3b CKD ([place])  Comments: Diabetic foot exam was performed.  She is no longer on medication at this time. She was congratulated for her ability to control her diabetes with diet/lifestyle changes. She will rto in six months for re-evaluation.   I DISCUSSED WITH THE PATIENT AT LENGTH REGARDING THE GOALS OF GLYCEMIC CONTROL AND POSSIBLE LONG-TERM COMPLICATIONS.  I  ALSO STRESSED THE IMPORTANCE OF COMPLIANCE WITH HOME GLUCOSE MONITORING, DIETARY RESTRICTIONS INCLUDING AVOIDANCE OF SUGARY DRINKS/PROCESSED FOODS,  ALONG WITH REGULAR EXERCISE.  I  ALSO STRESSED THE IMPORTANCE OF ANNUAL EYE EXAMS, SELF FOOT CARE AND COMPLIANCE WITH OFFICE VISITS.  - Hemoglobin A1c - Protein electrophoresis, serum - Phosphorus - Parathyroid Hormone, Intact w/Ca  4. Pure hypercholesterolemia  Comments: Chronic, encouraged to limit her fried food intake and to remain active. Importance of medication compliance was discussed with the patient.   - Lipid panel  5. Vitamin D deficiency disease  I WILL CHECK A VIT D LEVEL AND SUPPLEMENT AS NEEDED.  ALSO ENCOURAGED TO SPEND  [place].  - VITAMIN D 25 Hydroxy (Vit-D Deficiency, Fractures)  6. Body mass index (BMI) of 24.0-24.9 in adult  Comments: Her weight is stable. She is encouraged to increase her daily activity as tolerated.   7. Primary osteoarthritis of right knee  She was advised to apply OTC Voltaren gel to front/back/sides of knees two to three times daily. She is encouraged to let me know if her sx persist.    Patient was given opportunity to ask questions. Patient verbalized understanding of the plan and was able to repeat key elements of the plan. All questions were answered to their satisfaction.   [name], MD   I, [name], MD, have reviewed all documentation for this visit. The documentation on 01/09/20 for the exam, diagnosis, procedures, and orders are all accurate and complete.  THE PATIENT IS ENCOURAGED TO PRACTICE SOCIAL DISTANCING DUE TO THE COVID-19 PANDEMIC.

## 2020-01-09 NOTE — Patient Instructions (Addendum)
Voltaren gel - apply to front/sides/back of knee two to three times per day You may get this over the counter  Health Maintenance After Age 84 After age 59, you are at a higher risk for certain long-term diseases and infections as well as injuries from falls. Falls are a major cause of broken bones and head injuries in people who are older than age 74. Getting regular preventive care can help to keep you healthy and well. Preventive care includes getting regular testing and making lifestyle changes as recommended by your health care provider. Talk with your health care provider about:  Which screenings and tests you should have. A screening is a test that checks for a disease when you have no symptoms.  A diet and exercise plan that is right for you. What should I know about screenings and tests to prevent falls? Screening and testing are the best ways to find a health problem early. Early diagnosis and treatment give you the best chance of managing medical conditions that are common after age 58. Certain conditions and lifestyle choices may make you more likely to have a fall. Your health care provider may recommend:  Regular vision checks. Poor vision and conditions such as cataracts can make you more likely to have a fall. If you wear glasses, make sure to get your prescription updated if your vision changes.  Medicine review. Work with your health care provider to regularly review all of the medicines you are taking, including over-the-counter medicines. Ask your health care provider about any side effects that may make you more likely to have a fall. Tell your health care provider if any medicines that you take make you feel dizzy or sleepy.  Osteoporosis screening. Osteoporosis is a condition that causes the bones to get weaker. This can make the bones weak and cause them to break more easily.  Blood pressure screening. Blood pressure changes and medicines to control blood pressure can make  you feel dizzy.  Strength and balance checks. Your health care provider may recommend certain tests to check your strength and balance while standing, walking, or changing positions.  Foot health exam. Foot pain and numbness, as well as not wearing proper footwear, can make you more likely to have a fall.  Depression screening. You may be more likely to have a fall if you have a fear of falling, feel emotionally low, or feel unable to do activities that you used to do.  Alcohol use screening. Using too much alcohol can affect your balance and may make you more likely to have a fall. What actions can I take to lower my risk of falls? General instructions  Talk with your health care provider about your risks for falling. Tell your health care provider if: ? You fall. Be sure to tell your health care provider about all falls, even ones that seem minor. ? You feel dizzy, sleepy, or off-balance.  Take over-the-counter and prescription medicines only as told by your health care provider. These include any supplements.  Eat a healthy diet and maintain a healthy weight. A healthy diet includes low-fat dairy products, low-fat (lean) meats, and fiber from whole grains, beans, and lots of fruits and vegetables. Home safety  Remove any tripping hazards, such as rugs, cords, and clutter.  Install safety equipment such as grab bars in bathrooms and safety rails on stairs.  Keep rooms and walkways well-lit. Activity   Follow a regular exercise program to stay fit. This will help you maintain  your balance. Ask your health care provider what types of exercise are appropriate for you.  If you need a cane or walker, use it as recommended by your health care provider.  Wear supportive shoes that have nonskid soles. Lifestyle  Do not drink alcohol if your health care provider tells you not to drink.  If you drink alcohol, limit how much you have: ? 0-1 drink a day for women. ? 0-2 drinks a day for  men.  Be aware of how much alcohol is in your drink. In the [place] one drink equals one typical bottle of beer (12 oz), one-half glass of wine (5 oz), or one shot of hard liquor (1 oz).  Do not use any products that contain nicotine or tobacco, such as cigarettes and e-cigarettes. If you need help quitting, ask your health care provider. Summary  Having a healthy lifestyle and getting preventive care can help to protect your health and wellness after age 25.  Screening and testing are the best way to find a health problem early and help you avoid having a fall. Early diagnosis and treatment give you the best chance for managing medical conditions that are more common for people who are older than age 57.  Falls are a major cause of broken bones and head injuries in people who are older than age 73. Take precautions to prevent a fall at home.  Work with your health care provider to learn what changes you can make to improve your health and wellness and to prevent falls. This information is not intended to replace advice given to you by your health care provider. Make sure you discuss any questions you have with your health care provider. Document Revised: 09/23/2018 Document Reviewed: 04/15/2017 Elsevier Patient Education  2020 [employer].

## 2020-01-10 LAB — PHOSPHORUS: Phosphorus: 3.9 mg/dL (ref 3.0–4.3)

## 2020-01-10 LAB — PTH, INTACT AND CALCIUM: PTH: 31 pg/mL (ref 15–65)

## 2020-01-10 LAB — CBC
Hematocrit: 37 % (ref 34.0–46.6)
Hemoglobin: 11.7 g/dL (ref 11.1–15.9)
MCH: 28.7 pg (ref 26.6–33.0)
MCHC: 31.6 g/dL (ref 31.5–35.7)
MCV: 91 fL (ref 79–97)
Platelets: 261 10*3/uL (ref 150–450)
RBC: 4.08 x10E6/uL (ref 3.77–5.28)
RDW: 13.7 % (ref 11.7–15.4)
WBC: 5.5 10*3/uL (ref 3.4–10.8)

## 2020-01-10 LAB — PROTEIN ELECTROPHORESIS, SERUM
A/G Ratio: 1.2 (ref 0.7–1.7)
Albumin ELP: 3.7 g/dL (ref 2.9–4.4)
Alpha 1: 0.2 g/dL (ref 0.0–0.4)
Alpha 2: 0.7 g/dL (ref 0.4–1.0)
Beta: 1 g/dL (ref 0.7–1.3)
Gamma Globulin: 1.2 g/dL (ref 0.4–1.8)
Globulin, Total: 3.2 g/dL (ref 2.2–3.9)

## 2020-01-10 LAB — CMP14+EGFR
ALT: 15 IU/L (ref 0–32)
AST: 25 IU/L (ref 0–40)
Albumin/Globulin Ratio: 1.5 (ref 1.2–2.2)
Albumin: 4.1 g/dL (ref 3.6–4.6)
Alkaline Phosphatase: 67 IU/L (ref 48–121)
BUN/Creatinine Ratio: 17 (ref 12–28)
BUN: 22 mg/dL (ref 8–27)
Bilirubin Total: 0.6 mg/dL (ref 0.0–1.2)
CO2: 25 mmol/L (ref 20–29)
Calcium: 9.4 mg/dL (ref 8.7–10.3)
Chloride: 95 mmol/L — ABNORMAL LOW (ref 96–106)
Creatinine, Ser: 1.27 mg/dL — ABNORMAL HIGH (ref 0.57–1.00)
GFR calc Af Amer: 44 mL/min/{1.73_m2} — ABNORMAL LOW (ref >59)
GFR calc non Af Amer: 38 mL/min/{1.73_m2} — ABNORMAL LOW (ref >59)
Globulin, Total: 2.8 g/dL (ref 1.5–4.5)
Glucose: 91 mg/dL (ref 65–99)
Potassium: 3.3 mmol/L — ABNORMAL LOW (ref 3.5–5.2)
Sodium: 135 mmol/L (ref 134–144)
Total Protein: 6.9 g/dL (ref 6.0–8.5)

## 2020-01-10 LAB — HEMOGLOBIN A1C
Est. average glucose Bld gHb Est-mCnc: 123 mg/dL
Hgb A1c MFr Bld: 5.9 % — ABNORMAL HIGH (ref 4.8–5.6)

## 2020-01-10 LAB — VITAMIN D 25 HYDROXY (VIT D DEFICIENCY, FRACTURES): Vit D, 25-Hydroxy: 59.9 ng/mL (ref 30.0–100.0)

## 2020-01-11 ENCOUNTER — Telehealth: Payer: Self-pay

## 2020-01-11 LAB — LIPID PANEL
Chol/HDL Ratio: 2.8 ratio (ref 0.0–4.4)
Cholesterol, Total: 182 mg/dL (ref 100–199)
HDL: 64 mg/dL (ref >39)
LDL Chol Calc (NIH): 103 mg/dL — ABNORMAL HIGH (ref 0–99)
Triglycerides: 84 mg/dL (ref 0–149)
VLDL Cholesterol Cal: 15 mg/dL (ref 5–40)

## 2020-01-11 LAB — SPECIMEN STATUS REPORT

## 2020-01-11 NOTE — Telephone Encounter (Signed)
lvm for pt to return call for lab results  

## 2020-01-11 NOTE — Telephone Encounter (Signed)
-----   Message from Glendale Chard, MD sent at 01/10/2020  7:32 PM EDT ----- Here are your lab results:  Your hba1c is 5.9, this is in prediabetes range. Not bad!  Your vitamin D level is great, continue with current supplementation.   Your blood count is stable, as well as liver and kidney function. Your potassium is slightly low. Please increase your intake of potassium rich foods. We can mail you a list of these foods.     Please let me know if you have any questions or concerns. Stay safe!   Sincerely,    Robyn N. Baird Cancer, MD

## 2020-01-15 ENCOUNTER — Other Ambulatory Visit: Payer: Self-pay

## 2020-01-15 ENCOUNTER — Encounter (HOSPITAL_COMMUNITY): Payer: Self-pay | Admitting: Urgent Care

## 2020-01-15 ENCOUNTER — Ambulatory Visit (HOSPITAL_COMMUNITY)
Admission: EM | Admit: 2020-01-15 | Discharge: 2020-01-15 | Disposition: A | Discharge status: Home / Self Care | Payer: Medicare PPO | Attending: Family Medicine | Admitting: Family Medicine

## 2020-01-15 DIAGNOSIS — Z8249 Family history of ischemic heart disease and other diseases of the circulatory system: Secondary | ICD-10-CM | POA: Diagnosis not present

## 2020-01-15 DIAGNOSIS — M25462 Effusion, left knee: Secondary | ICD-10-CM | POA: Insufficient documentation

## 2020-01-15 DIAGNOSIS — E7800 Pure hypercholesterolemia, unspecified: Secondary | ICD-10-CM | POA: Diagnosis not present

## 2020-01-15 DIAGNOSIS — Z20822 Contact with and (suspected) exposure to covid-19: Secondary | ICD-10-CM | POA: Diagnosis not present

## 2020-01-15 DIAGNOSIS — N183 Chronic kidney disease, stage 3 unspecified: Secondary | ICD-10-CM | POA: Diagnosis not present

## 2020-01-15 DIAGNOSIS — I129 Hypertensive chronic kidney disease with stage 1 through stage 4 chronic kidney disease, or unspecified chronic kidney disease: Secondary | ICD-10-CM | POA: Diagnosis not present

## 2020-01-15 DIAGNOSIS — Z79899 Other long term (current) drug therapy: Secondary | ICD-10-CM | POA: Diagnosis not present

## 2020-01-15 LAB — CBC WITH DIFFERENTIAL/PLATELET
Abs Immature Granulocytes: 0.02 10*3/uL (ref 0.00–0.07)
Basophils Absolute: 0 10*3/uL (ref 0.0–0.1)
Basophils Relative: 0 %
Eosinophils Absolute: 0 10*3/uL (ref 0.0–0.5)
Eosinophils Relative: 1 %
HCT: 35.2 % — ABNORMAL LOW (ref 36.0–46.0)
Hemoglobin: 11.7 g/dL — ABNORMAL LOW (ref 12.0–15.0)
Immature Granulocytes: 0 %
Lymphocytes Relative: 11 %
Lymphs Abs: 0.9 10*3/uL (ref 0.7–4.0)
MCH: 30.2 pg (ref 26.0–34.0)
MCHC: 33.2 g/dL (ref 30.0–36.0)
MCV: 91 fL (ref 80.0–100.0)
Monocytes Absolute: 1.1 10*3/uL — ABNORMAL HIGH (ref 0.1–1.0)
Monocytes Relative: 14 %
Neutro Abs: 5.6 10*3/uL (ref 1.7–7.7)
Neutrophils Relative %: 74 %
Platelets: 259 10*3/uL (ref 150–400)
RBC: 3.87 MIL/uL (ref 3.87–5.11)
RDW: 14.3 % (ref 11.5–15.5)
WBC: 7.6 10*3/uL (ref 4.0–10.5)
nRBC: 0 % (ref 0.0–0.2)

## 2020-01-15 LAB — SYNOVIAL CELL COUNT + DIFF, W/ CRYSTALS
Eosinophils-Synovial: 0 % (ref 0–1)
Lymphocytes-Synovial Fld: 3 % (ref 0–20)
Monocyte-Macrophage-Synovial Fluid: 10 % — ABNORMAL LOW (ref 50–90)
Neutrophil, Synovial: 87 % — ABNORMAL HIGH (ref 0–25)
WBC, Synovial: 27720 /mm3 — ABNORMAL HIGH (ref 0–200)

## 2020-01-15 MED ORDER — TRIAMCINOLONE ACETONIDE 40 MG/ML IJ SUSP
INTRAMUSCULAR | Status: AC
  Filled 2020-01-15: qty 1

## 2020-01-15 MED ORDER — BUPIVACAINE HCL (PF) 0.5 % IJ SOLN
INTRAMUSCULAR | Status: AC
  Filled 2020-01-15: qty 10

## 2020-01-15 MED ORDER — LIDOCAINE HCL (PF) 1 % IJ SOLN
INTRAMUSCULAR | Status: AC
  Filled 2020-01-15: qty 30

## 2020-01-15 NOTE — ED Provider Notes (Signed)
Shannon Mcconnell    CSN: [id number] Arrival date & time: 01/15/20  1543      History   Chief Complaint Chief Complaint  Patient presents with  . Joint Swelling    HPI Shannon Mcconnell is a 86 y.o. female.   She is presenting with acute left knee pain.  The pain is globally around the knee.  She has never had problems with this knee previously.  He does feel mildly warm.  She has had a fever measured in clinic but was unaware of this fever.  She denies any other symptoms.  HPI  Past Medical History:  Diagnosis Date  . DM (diabetes mellitus) ([place])   . High cholesterol   . Hypertension     Patient Active Problem List   Diagnosis Date Noted  . Pure hypercholesterolemia 08/30/2018  . Type 2 diabetes mellitus with stage 3 chronic kidney disease, without long-term current use of insulin ([place]) 03/06/2018  . Hypertensive nephropathy 03/06/2018  . Chronic kidney disease, stage 3 03/06/2018    Past Surgical History:  Procedure Laterality Date  . ABDOMINAL HYSTERECTOMY    . mastectomy Left     OB History   No obstetric history on file.      Home Medications    Prior to Admission medications   Medication Sig Start Date End Date Taking? Authorizing Provider  amLODipine (NORVASC) 10 MG tablet Take 1 tablet (10 mg total) by mouth daily. 01/09/20   Glendale Chard, MD  Blood Glucose Monitoring Suppl (TRUE METRIX METER) w/Device KIT USE AS DIRECTED TO CHECK BLOOD SUGAR TWICE DAILY DX: E11.65 05/19/19   Glendale Chard, MD  Cholecalciferol (VITAMIN D3) 50 MCG (2000 UT) capsule TAKE 1 CAPSULE BY MOUTH TWICE DAILY **DISCONTINUE  VITAMIN  D2  50,000  IU** 06/21/18   Glendale Chard, MD  hydrochlorothiazide (MICROZIDE) 12.5 MG capsule Take 1 capsule (12.5 mg total) by mouth daily. Patient does not need right now, please keep on file and d/c 44m dose 01/09/20 01/08/21  SGlendale Chard MD  olmesartan (BENICAR) 40 MG tablet Take 1 tablet (40 mg total) by mouth daily. 01/09/20   SGlendale Chard MD  pravastatin (PRAVACHOL) 20 MG tablet Take 1 tablet (20 mg total) by mouth daily. 01/09/20   SGlendale Chard MD  TRUE METRIX BLOOD GLUCOSE TEST test strip USE AS DIRECTED TO CHECK BLOOD SUGAR TWICE DAILY 08/18/19   SGlendale Chard MD    Family History Family History  Problem Relation Age of Onset  . Hypertension Mother   . Hypertension Father     Social History Social History   Tobacco Use  . Smoking status: Never Smoker  . Smokeless tobacco: Never Used  Vaping Use  . Vaping Use: Never used  Substance Use Topics  . Alcohol use: Never  . Drug use: Never     Allergies   Patient has no known allergies.   Review of Systems Review of Systems  See HPI  Physical Exam Triage Vital Signs ED Triage Vitals  Enc Vitals Group     BP 01/15/20 1642 (!) 151/87     Pulse Rate 01/15/20 1642 (!) 111     Resp 01/15/20 1642 18     Temp 01/15/20 1642 (!) 101.1 F (38.4 C)     Temp Source 01/15/20 1642 Oral     SpO2 01/15/20 1642 100 %     Weight --      Height --      Head Circumference --  Peak Flow --      Pain Score 01/15/20 1640 8     Pain Loc --      Pain Edu? --      Excl. in [place]? --    No data found.  Updated Vital Signs BP (!) 151/87 (BP Location: Right Arm)   Pulse (!) 111   Temp (!) 101.1 F (38.4 C) (Oral)   Resp 18   SpO2 100%   Visual Acuity Right Eye Distance:   Left Eye Distance:   Bilateral Distance:    Right Eye Near:   Left Eye Near:    Bilateral Near:     Physical Exam Gen: NAD, alert, cooperative with exam, well-appearing ENT: normal lips, normal nasal mucosa,  Eye: normal EOM, normal conjunctiva and lids CV:  no edema, Resp: no accessory muscle use, non-labored,   Skin: no rashes, no areas of induration  Neuro: normal tone, normal sensation to touch Psych:  normal insight, alert and oriented MSK:  Left knee: Effusion noted. Warmth over the knee. No redness. Tenderness globally around the knee. Limited flexion  extension. Neurovascularly intact   UC Treatments / Results  Labs (all labs ordered are listed, but only abnormal results are displayed) Labs Reviewed  CBC WITH DIFFERENTIAL/PLATELET - Abnormal; Notable for the following components:      Result Value   Hemoglobin 11.7 (*)    HCT 35.2 (*)    Monocytes Absolute 1.1 (*)    All other components within normal limits  SARS CORONAVIRUS 2 (TAT 6-24 HRS)  GRAM STAIN  BODY FLUID CULTURE  SYNOVIAL CELL COUNT + DIFF, W/ CRYSTALS    EKG   Radiology No results found.  Procedures Procedures (including critical care time)   Aspiration/Injection Procedure Note Shannon Mcconnell 1931-08-07  Procedure: Injection Indications: Left knee pain  Procedure Details Consent: Risks of procedure as well as the alternatives and risks of each were explained to the (patient/caregiver).  Consent for procedure obtained. Time Out: Verified patient identification, verified procedure, site/side was marked, verified correct patient position, special equipment/implants available, medications/allergies/relevent history reviewed, required imaging and test results available.  Performed.  The area was cleaned with iodine and alcohol swabs.    The left knee superior lateral suprapatellar pouch was injected 5 cc of 1% lidocaine without epinephrine on a 25-gauge 1-1/2 inch needle.  An 18-gauge 1-1/2 inch needle was inserted for aspiration.  The syringe was switched to mixture containing using 1 cc's of 40 mg Kenalog and 4 cc's of 0.5% bupivacaine was injected.      Amount of Fluid Aspirated: 62m Character of Fluid: straw colored Fluid was sent [id number]:[id number], crystal identification and gram strain culture  A sterile dressing was applied.  Patient did tolerate procedure well.    Medications Ordered in UC Medications - No data to display  Initial Impression / Assessment and Plan / UC Course  I have reviewed the triage vital signs and the nursing  notes.  Pertinent labs & imaging results that were available during my care of the patient were reviewed by me and considered in my medical decision making (see chart for details).     Shannon Mcconnell an 86year old female is presenting with acute left knee pain.  Aspiration was inflammatory in appearance.  Seems less likely for infectious.  She was febrile in clinic.  Unclear of where the fever could be coming from.  The synovial fluid was sent for Gram stain and culture.  Covid swab was obtained.  Steroid injection was placed into the knee.  Counseled on close follow-up.  Final Clinical Impressions(s) / UC Diagnoses   Final diagnoses:  Effusion of left knee     Discharge Instructions     We will call with the results from today  Please follow up in 2 days.  Please go to the emergency department if your symptoms worsen.     ED Prescriptions    None     PDMP not reviewed this encounter.   Rosemarie Ax, MD 01/15/20 2007

## 2020-01-15 NOTE — ED Triage Notes (Signed)
Patient complains of joint swelling of the left knee for "a few days". Patient reports she has had to have fluid drained from knee before.

## 2020-01-15 NOTE — Discharge Instructions (Signed)
We will call with the results from today  Please follow up in 2 days.  Please go to the emergency department if your symptoms worsen.

## 2020-01-16 LAB — SARS CORONAVIRUS 2 (TAT 6-24 HRS): SARS Coronavirus 2: NEGATIVE

## 2020-01-19 LAB — BODY FLUID CULTURE: Culture: NO GROWTH

## 2020-01-20 ENCOUNTER — Telehealth: Payer: Self-pay | Admitting: Family Medicine

## 2020-01-20 NOTE — Telephone Encounter (Signed)
Informed of results.  Synovial fluid analysis not revealing for infection.  It was revealing for pseudogout.  Rosemarie Ax, MD [facility] 01/20/2020, 12:18 PM

## 2020-02-24 ENCOUNTER — Other Ambulatory Visit: Payer: Self-pay | Admitting: Internal Medicine

## 2020-03-07 ENCOUNTER — Other Ambulatory Visit: Payer: Self-pay | Admitting: Internal Medicine

## 2020-04-03 ENCOUNTER — Telehealth: Payer: Self-pay

## 2020-04-03 NOTE — Telephone Encounter (Signed)
I returned the pt's call and notified her that she hasn't had her flu vaccination this year.

## 2020-04-10 ENCOUNTER — Encounter: Payer: Self-pay | Admitting: Internal Medicine

## 2020-04-24 ENCOUNTER — Other Ambulatory Visit: Payer: Self-pay | Admitting: Internal Medicine

## 2020-05-17 ENCOUNTER — Encounter: Payer: Self-pay | Admitting: Internal Medicine

## 2020-06-04 ENCOUNTER — Other Ambulatory Visit: Payer: Self-pay | Admitting: Internal Medicine

## 2020-06-05 ENCOUNTER — Other Ambulatory Visit: Payer: Self-pay | Admitting: Internal Medicine

## 2020-07-03 ENCOUNTER — Telehealth: Payer: Self-pay

## 2020-07-03 NOTE — Telephone Encounter (Signed)
I returned the pt's call.  She wanted to cancel her appts for tomorrow and reschedule.  The pt was scheduled with Dr. Baird Cancer for a blood pressure f/u and an AWV visit with [facility].

## 2020-07-04 ENCOUNTER — Ambulatory Visit: Payer: Medicare PPO | Admitting: Internal Medicine

## 2020-07-11 ENCOUNTER — Telehealth: Payer: Self-pay | Admitting: Internal Medicine

## 2020-07-11 NOTE — Telephone Encounter (Signed)
Left message for patient to call back and schedule Medicare Annual Wellness Visit (AWV) .   Last AWV 07/07/19  please schedule at anytime with [facility]    This should be a 45 minute visit.

## 2020-08-29 ENCOUNTER — Encounter: Payer: Self-pay | Admitting: Internal Medicine

## 2020-08-29 ENCOUNTER — Ambulatory Visit: Payer: Medicare PPO | Admitting: Internal Medicine

## 2020-08-29 ENCOUNTER — Ambulatory Visit (INDEPENDENT_AMBULATORY_CARE_PROVIDER_SITE_OTHER): Payer: Medicare PPO

## 2020-08-29 ENCOUNTER — Other Ambulatory Visit: Payer: Self-pay

## 2020-08-29 VITALS — BP 156/90 | HR 104 | Temp 98.3°F | Ht 65.4 in | Wt 148.6 lb

## 2020-08-29 VITALS — BP 156/90 | HR 104 | Temp 98.3°F | Ht 65.4 in | Wt 148.0 lb

## 2020-08-29 DIAGNOSIS — E2839 Other primary ovarian failure: Secondary | ICD-10-CM

## 2020-08-29 DIAGNOSIS — Z0000 Encounter for general adult medical examination without abnormal findings: Secondary | ICD-10-CM

## 2020-08-29 DIAGNOSIS — Z6824 Body mass index (BMI) 24.0-24.9, adult: Secondary | ICD-10-CM | POA: Diagnosis not present

## 2020-08-29 DIAGNOSIS — I129 Hypertensive chronic kidney disease with stage 1 through stage 4 chronic kidney disease, or unspecified chronic kidney disease: Secondary | ICD-10-CM

## 2020-08-29 DIAGNOSIS — N183 Chronic kidney disease, stage 3 unspecified: Secondary | ICD-10-CM

## 2020-08-29 DIAGNOSIS — N1831 Chronic kidney disease, stage 3a: Secondary | ICD-10-CM | POA: Diagnosis not present

## 2020-08-29 DIAGNOSIS — E7800 Pure hypercholesterolemia, unspecified: Secondary | ICD-10-CM | POA: Diagnosis not present

## 2020-08-29 DIAGNOSIS — E1122 Type 2 diabetes mellitus with diabetic chronic kidney disease: Secondary | ICD-10-CM | POA: Diagnosis not present

## 2020-08-29 DIAGNOSIS — R7309 Other abnormal glucose: Secondary | ICD-10-CM

## 2020-08-29 NOTE — Progress Notes (Signed)
This visit occurred during the SARS-CoV-2 public health emergency.  Safety protocols were in place, including screening questions prior to the visit, additional usage of staff PPE, and extensive cleaning of exam room while observing appropriate contact time as indicated for disinfecting solutions.  Subjective:   Shannon Mcconnell is a 87 y.o. female who presents for Medicare Annual (Subsequent) preventive examination.  Review of Systems     Cardiac Risk Factors include: advanced age (>83mn, >>55women);diabetes mellitus;hypertension;sedentary lifestyle     Objective:    Today's Vitals   08/29/20 1534 08/29/20 1535  BP: (!) 156/90   Pulse: (!) 104   Temp: 98.3 F (36.8 C)   TempSrc: Oral   Weight: 148 lb (67.1 kg)   Height: 5' 5.4" (1.661 m)   PainSc:  6    Body mass index is 24.33 kg/m.  Advanced Directives 08/29/2020 07/07/2019 05/26/2018  Does Patient Have a Medical Advance Directive? No No No  Would patient like information on creating a medical advance directive? - Yes (MAU/Ambulatory/Procedural Areas - Information given) -    Current Medications (verified) Outpatient Encounter Medications as of 08/29/2020  Medication Sig  . amLODipine (NORVASC) 10 MG tablet Take 1 tablet (10 mg total) by mouth daily.  . Blood Glucose Monitoring Suppl (TRUE METRIX METER) w/Device KIT USE AS DIRECTED TO CHECK BLOOD SUGAR TWICE DAILY DX: E11.65  . Cholecalciferol (VITAMIN D3) 50 MCG (2000 UT) capsule TAKE 1 CAPSULE BY MOUTH TWICE DAILY **DISCONTINUE  VITAMIN  D2  50,000  IU**  . hydrochlorothiazide (MICROZIDE) 12.5 MG capsule Take 1 capsule (12.5 mg total) by mouth daily. Patient does not need right now, please keep on file and d/c 288mdose  . olmesartan (BENICAR) 40 MG tablet Take 1 tablet (40 mg total) by mouth daily.  . pravastatin (PRAVACHOL) 20 MG tablet Take 1 tablet by mouth once daily  . RELION TRUE METRIX TEST STRIPS test strip USE AS DIRECTED TWICE DAILY   No facility-administered  encounter medications on file as of 08/29/2020.    Allergies (verified) Patient has no known allergies.   History: Past Medical History:  Diagnosis Date  . [place] mellitus[place]  . High cholesterol   . Hypertension    Past Surgical History:  Procedure Laterality Date  . ABDOMINAL HYSTERECTOMY    . mastectomy Left    Family History  Problem Relation Age of Onset  . Hypertension Mother   . Hypertension Father    Social History   Socioeconomic History  . Marital status: Divorced    Spouse name: Not on file  . Number of children: Not on file  . Years of education: Not on file  . Highest education level: Not on file  Occupational History  . Occupation: retired  Tobacco Use  . Smoking status: Never Smoker  . Smokeless tobacco: Never Used  Vaping Use  . Vaping Use: Never used  Substance and Sexual Activity  . Alcohol use: Never  . Drug use: Never  . Sexual activity: Not Currently  Other Topics Concern  . Not on file  Social History Narrative  . Not on file   Social Determinants of Health   Financial Resource Strain: Low Risk   . Difficulty of Paying Living Expenses: Not hard at all  Food Insecurity: No Food Insecurity  . Worried About RuCharity fundraisern the Last Year: Never true  . Ran Out of Food in the Last Year: Never true  Transportation Needs: No Transportation Needs  .  Lack of Transportation (Medical): No  . Lack of Transportation (Non-Medical): No  Physical Activity: Insufficiently Active  . Days of Exercise per Week: 3 days  . Minutes of Exercise per Session: 10 min  Stress: No Stress Concern Present  . Feeling of Stress : Not at all  Social Connections: Not on file    Tobacco Counseling Counseling given: Not Answered   Clinical Intake:  Pre-visit preparation completed: Yes  Pain : 0-10 Pain Score: 6  Pain Type: Chronic pain Pain Location: Knee Pain Orientation: Left,Right Pain Descriptors / Indicators: Aching Pain Onset: More  than a month ago Pain Frequency: Intermittent     Nutritional Status: BMI of 19-24  Normal Nutritional Risks: None Diabetes: Yes  How often do you need to have someone help you when you read instructions, pamphlets, or other written materials from your doctor or pharmacy?: 1 - Never What is the last grade level you completed in school?: 10th grade  Diabetic? Yes Nutrition Risk Assessment:  Has the patient had any N/V/D within the last 2 months?  No  Does the patient have any non-healing wounds?  No  Has the patient had any unintentional weight loss or weight gain?  No   Diabetes:  Is the patient diabetic?  Yes  If diabetic, was a CBG obtained today?  No  Did the patient bring in their glucometer from home?  No  How often do you monitor your CBG's? Every other day.   Financial Strains and Diabetes Management:  Are you having any financial strains with the device, your supplies or your medication? No .  Does the patient want to be seen by Chronic Care Management for management of their diabetes?  No  Would the patient like to be referred to a Nutritionist or for Diabetic Management?  No   Diabetic Exams:  Diabetic Eye Exam: Completed 2021 Diabetic Foot Exam: Completed 01/09/2020   Interpreter Needed?: No  Information entered by :: NAllen LPN   Activities of Daily Living In your present state of health, do you have any difficulty performing the following activities: 08/29/2020 08/29/2020  Hearing? N N  Vision? N N  Difficulty concentrating or making decisions? N N  Walking or climbing stairs? Y N  Comment - don't have any stairs that she has to climb  Dressing or bathing? N N  Doing errands, shopping? N N  Preparing Food and eating ? N -  Using the Toilet? N -  In the past six months, have you accidently leaked urine? Y -  Do you have problems with loss of bowel control? N -  Managing your Medications? N -  Managing your Finances? N -  Housekeeping or managing your  Housekeeping? N -  Some recent data might be hidden    Patient Care Team: Glendale Chard, MD as PCP - General (Internal Medicine)  Indicate any recent Medical Services you may have received from other than [facility] providers in the past year (date may be approximate).     Assessment:   This is a routine wellness examination for [place].  Hearing/Vision screen  Hearing Screening   '125Hz'  '250Hz'  '500Hz'  '1000Hz'  '2000Hz'  '3000Hz'  '4000Hz'  '6000Hz'  '8000Hz'   Right ear:           Left ear:           Vision Screening Comments: Regular eye exams, [place]  Dietary issues and exercise activities discussed: Current Exercise Habits: Home exercise routine, Type of exercise: walking, Time (Minutes): 10,  Frequency (Times/Week): 3, Weekly Exercise (Minutes/Week): 30  Goals    . Exercise 150 min/wk Moderate Activity     07/07/2019, wants to start exercising regularly    . Patient Stated     08/29/2020, no goals      Depression Screen PHQ 2/9 Scores 08/29/2020 01/09/2020 07/07/2019 11/22/2018 08/26/2018 07/02/2018 05/26/2018  PHQ - 2 Score 0 0 0 0 0 0 0  PHQ- 9 Score - - 0 - - - -    Fall Risk Fall Risk  08/29/2020 08/29/2020 07/07/2019 02/09/2019 11/22/2018  Falls in the past year? 0 0 0 0 0  Risk for fall due to : Medication side effect - Medication side effect - -  Follow up Falls evaluation completed;Education provided;Falls prevention discussed - Falls evaluation completed;Education provided;Falls prevention discussed - -    FALL RISK PREVENTION PERTAINING TO THE HOME:  Any stairs in or around the home? No  If so, are there any without handrails? n/a Home free of loose throw rugs in walkways, pet beds, electrical cords, etc? Yes  Adequate lighting in your home to reduce risk of falls? Yes   ASSISTIVE DEVICES UTILIZED TO PREVENT FALLS:  Life alert? No  Use of a cane, walker or w/c? No  Grab bars in the bathroom? Yes  Shower chair or bench in shower? Yes  Elevated toilet seat or a handicapped  toilet? Yes   TIMED UP AND GO:  Was the test performed? No . .   Cognitive Function: MMSE - Mini Mental State Exam 05/26/2018  Orientation to Place 5  Attention/ Calculation 5     6CIT Screen 08/29/2020 07/07/2019  What Year? 0 points 0 points  What month? 0 points 0 points  What time? 0 points 0 points  Count back from 20 0 points 0 points  Months in reverse 4 points 4 points  Repeat phrase 10 points 0 points  Total Score 14 4    Immunizations Immunization History  Administered Date(s) Administered  . Influenza, High Dose Seasonal PF 04/11/2018, 03/09/2019  . Influenza-Unspecified 05/12/2020  . Moderna Sars-Covid-2 Vaccination 06/27/2019, 07/26/2019, 04/07/2020  . Pneumococcal Conjugate-13 10/17/2013, 02/04/2017  . Zoster Recombinat (Shingrix) 06/08/2018, 01/08/2019    TDAP status: Up to date  Flu Vaccine status: Up to date  Pneumococcal vaccine status: Up to date  Covid-19 vaccine status: Completed vaccines  Qualifies for Shingles Vaccine? Yes   Zostavax completed No   Shingrix Completed?: Yes  Screening Tests Health Maintenance  Topic Date Due  . DEXA SCAN  Never done  . OPHTHALMOLOGY EXAM  11/15/2018  . HEMOGLOBIN A1C  07/11/2020  . FOOT EXAM  01/08/2021  . TETANUS/TDAP  04/23/2021  . INFLUENZA VACCINE  Completed  . COVID-19 Vaccine  Completed  . PNA vac Low Risk Adult  Completed  . HPV VACCINES  Aged Out    Health Maintenance  Health Maintenance Due  Topic Date Due  . DEXA SCAN  Never done  . OPHTHALMOLOGY EXAM  11/15/2018  . HEMOGLOBIN A1C  07/11/2020    Colorectal cancer screening: No longer required.   Mammogram status: No longer required due to age.  Bone Density status: Ordered today. Pt provided with contact info and advised to call to schedule appt.  Lung Cancer Screening: (Low Dose CT Chest recommended if Age 47-80 years, 30 pack-year currently smoking OR have quit w/in 15years.) does not qualify.   Lung Cancer Screening Referral:  no  Additional Screening:  Hepatitis C Screening: does not qualify;  Vision Screening: Recommended annual ophthalmology exams for early detection of glaucoma and other disorders of the eye. Is the patient up to date with their annual eye exam?  Yes  Who is the provider or what is the name of the office in which the patient attends annual eye exams? [place] If pt is not established with a provider, would they like to be referred to a provider to establish care? No .   Dental Screening: Recommended annual dental exams for proper oral hygiene  Community Resource Referral / Chronic Care Management: CRR required this visit?  No   CCM required this visit?  No      Plan:     I have personally reviewed and noted the following in the patient's chart:   . Medical and social history . Use of alcohol, tobacco or illicit drugs  . Current medications and supplements . Functional ability and status . Nutritional status . Physical activity . Advanced directives . List of other physicians . Hospitalizations, surgeries, and ER visits in previous 12 months . Vitals . Screenings to include cognitive, depression, and falls . Referrals and appointments  In addition, I have reviewed and discussed with patient certain preventive protocols, quality metrics, and best practice recommendations. A written personalized care plan for preventive services as well as general preventive health recommendations were provided to patient.     Kellie Simmering, LPN   [id number]   Nurse Notes:

## 2020-08-29 NOTE — Progress Notes (Signed)
I,[name],acting as a Education administrator for [name], MD.,have documented all relevant documentation on the behalf of [name], MD,as directed by  [name], MD while in the presence of [name], MD.  This visit occurred during the SARS-CoV-2 public health emergency.  Safety protocols were in place, including screening questions prior to the visit, additional usage of staff PPE, and extensive cleaning of exam room while observing appropriate contact time as indicated for disinfecting solutions.  Subjective:     Patient ID: Shannon Mcconnell , female    DOB: [birth year] , 87 y.o.   MRN: [id number]   Chief Complaint  Patient presents with  . Diabetes  . Hypertension    HPI  The patient is here today for a diabetes and blood pressure f/u. She reports compliance with meds. She denies headaches, chest pain and shortness of breath. She is also scheduled for AWV with [facility] Advisor today.   Diabetes She presents for her follow-up diabetic visit. She has type 2 diabetes mellitus. Pertinent negatives for diabetes include no blurred vision and no chest pain. There are no hypoglycemic complications. There are no diabetic complications. Risk factors for coronary artery disease include diabetes mellitus, dyslipidemia, hypertension, post-menopausal and sedentary lifestyle. Current diabetic treatment includes diet. She is compliant with treatment most of the time. She participates in exercise intermittently. An ACE inhibitor/angiotensin II receptor blocker is being taken. Eye exam is not current.  Hypertension This is a chronic problem. The current episode started more than 1 year ago. The problem has been gradually improving since onset. The problem is controlled. Pertinent negatives include no blurred vision, chest pain, palpitations or shortness of breath. Past treatments include calcium channel blockers, angiotensin blockers and diuretics. The current treatment provides moderate  improvement. Compliance problems include exercise.  Hypertensive end-organ damage includes kidney disease.     Past Medical History:  Diagnosis Date  . DM (diabetes mellitus) ([place])   . High cholesterol   . Hypertension      Family History  Problem Relation Age of Onset  . Hypertension Mother   . Hypertension Father      Current Outpatient Medications:  .  amLODipine (NORVASC) 10 MG tablet, Take 1 tablet (10 mg total) by mouth daily., Disp: 90 tablet, Rfl: 2 .  Cholecalciferol (VITAMIN D3) 50 MCG (2000 UT) capsule, TAKE 1 CAPSULE BY MOUTH TWICE DAILY **DISCONTINUE  VITAMIN  D2  50,000  IU**, Disp: 60 capsule, Rfl: 0 .  hydrochlorothiazide (MICROZIDE) 12.5 MG capsule, Take 1 capsule (12.5 mg total) by mouth daily. Patient does not need right now, please keep on file and d/c 102m dose, Disp: 90 capsule, Rfl: 2 .  olmesartan (BENICAR) 40 MG tablet, Take 1 tablet (40 mg total) by mouth daily., Disp: 90 tablet, Rfl: 2 .  pravastatin (PRAVACHOL) 20 MG tablet, Take 1 tablet by mouth once daily, Disp: 90 tablet, Rfl: 0 .  RELION TRUE METRIX TEST STRIPS test strip, USE AS DIRECTED TWICE DAILY, Disp: 50 each, Rfl: 3 .  Blood Glucose Monitoring Suppl (TRUE METRIX METER) w/Device KIT, USE AS DIRECTED TO CHECK BLOOD SUGAR TWICE DAILY DX: E11.65, Disp: 1 kit, Rfl: 1   No Known Allergies   Review of Systems  Constitutional: Negative.   Eyes: Negative for blurred vision.  Respiratory: Negative.  Negative for shortness of breath.   Cardiovascular: Negative.  Negative for chest pain and palpitations.  Gastrointestinal: Negative.   Psychiatric/Behavioral: Negative.   All other systems reviewed and are  negative.    Today's Vitals   08/29/20 1517  BP: (!) 156/90  Pulse: (!) 104  Temp: 98.3 F (36.8 C)  TempSrc: Oral  Weight: 148 lb 9.6 oz (67.4 kg)  Height: 5' 5.4" (1.661 m)  PainSc: 6   PainLoc: Knee   Body mass index is 24.43 kg/m.  Wt Readings from Last 3 Encounters:  08/29/20 148  lb (67.1 kg)  08/29/20 148 lb 9.6 oz (67.4 kg)  01/09/20 152 lb (68.9 kg)   BP Readings from Last 3 Encounters:  08/29/20 (!) 156/90  08/29/20 (!) 156/90  01/15/20 (!) 151/87     Objective:  Physical Exam Vitals and nursing note reviewed.  Constitutional:      Appearance: Normal appearance. She is obese.  HENT:     Head: Normocephalic and atraumatic.     Nose:     Comments: Masked     Mouth/Throat:     Comments: Masked  Cardiovascular:     Rate and Rhythm: Normal rate and regular rhythm.     Heart sounds: Normal heart sounds.  Pulmonary:     Effort: Pulmonary effort is normal.     Breath sounds: Normal breath sounds.  Musculoskeletal:     Cervical back: Normal range of motion.  Skin:    General: Skin is warm.  Neurological:     General: No focal deficit present.     Mental Status: She is alert and oriented to person, place, and time.         Assessment And Plan:     1. Type 2 diabetes mellitus with stage 3a chronic kidney disease, without long-term current use of insulin (HCC) Comments: Chronic, I will check labs as listed below. She is diet controlled. I plan to check Renal labs at her next visit.  - Hemoglobin A1c - CMP14+EGFR  2. Hypertensive nephropathy Comments: I will change timing of meds. Advised to take olmesartan/hctz in am and amlodipine in pm. She will f/u 2-3 weeks for a nurse visit. Will add carvedilol bid  - CMP14+EGFR  3. Pure hypercholesterolemia Comments: Chronic, advised to limit her intake of fried foods. Encouraged to increase exercise, including chair exercises into her daily routine.   4. Body mass index (BMI) of 24.0-24.9 in adult Comments: She has lost 4 pounds since her last visit in July 2021. She is encouraged to keep BMI between 24-27. Advised to add Boost/Ensure/Glucerna - one shake daily.   Patient was given opportunity to ask questions. Patient verbalized understanding of the plan and was able to repeat key elements of the plan.  All questions were answered to their satisfaction.   I, [name], MD, have reviewed all documentation for this visit. The documentation on 08/29/20 for the exam, diagnosis, procedures, and orders are all accurate and complete.   IF YOU HAVE BEEN REFERRED TO A SPECIALIST, IT MAY TAKE 1-2 WEEKS TO SCHEDULE/PROCESS THE REFERRAL. IF YOU HAVE NOT HEARD FROM US/SPECIALIST IN TWO WEEKS, PLEASE GIVE [place] A CALL AT [phone].   THE PATIENT IS ENCOURAGED TO PRACTICE SOCIAL DISTANCING DUE TO THE COVID-19 PANDEMIC.

## 2020-08-29 NOTE — Patient Instructions (Signed)
[name] , Thank you for taking time to come for your Medicare Wellness Visit. I appreciate your ongoing commitment to your health goals. Please review the following plan we discussed and let me know if I can assist you in the future.   Screening recommendations/referrals: Colonoscopy: not required Mammogram: not required Bone Density: ordered today Recommended yearly ophthalmology/optometry visit for glaucoma screening and checkup Recommended yearly dental visit for hygiene and checkup  Vaccinations: Influenza vaccine: completed 05/12/2020, due 01/14/2021 Pneumococcal vaccine: completed 02/04/2017 Tdap vaccine: completed 04/24/2011, due 04/23/2021 Shingles vaccine:  Completed    Covid-19:04/07/2020, 07/26/2019, 06/27/2019  Advanced directives: Advance directive discussed with you today. Even though you declined this today please call our office should you change your mind and we can give you the proper paperwork for you to fill out.  Conditions/risks identified: none  Next appointment: Follow up in one year for your annual wellness visit    Preventive Care 65 Years and Older, Female Preventive care refers to lifestyle choices and visits with your health care provider that can promote health and wellness. What does preventive care include?  A yearly physical exam. This is also called an annual well check.  Dental exams once or twice a year.  Routine eye exams. Ask your health care provider how often you should have your eyes checked.  Personal lifestyle choices, including:  Daily care of your teeth and gums.  Regular physical activity.  Eating a healthy diet.  Avoiding tobacco and drug use.  Limiting alcohol use.  Practicing safe sex.  Taking low-dose aspirin every day.  Taking vitamin and mineral supplements as recommended by your health care provider. What happens during an annual well check? The services and screenings done by your health care provider during your  annual well check will depend on your age, overall health, lifestyle risk factors, and family history of disease. Counseling  Your health care provider may ask you questions about your:  Alcohol use.  Tobacco use.  Drug use.  Emotional well-being.  Home and relationship well-being.  Sexual activity.  Eating habits.  History of falls.  Memory and ability to understand (cognition).  Work and work Statistician.  Reproductive health. Screening  You may have the following tests or measurements:  Height, weight, and BMI.  Blood pressure.  Lipid and cholesterol levels. These may be checked every 5 years, or more frequently if you are over 31 years old.  Skin check.  Lung cancer screening. You may have this screening every year starting at age 80 if you have a 30-pack-year history of smoking and currently smoke or have quit within the past 15 years.  Fecal occult blood test (FOBT) of the stool. You may have this test every year starting at age 67.  Flexible sigmoidoscopy or colonoscopy. You may have a sigmoidoscopy every 5 years or a colonoscopy every 10 years starting at age 51.  Hepatitis C blood test.  Hepatitis B blood test.  Sexually transmitted disease (STD) testing.  Diabetes screening. This is done by checking your blood sugar (glucose) after you have not eaten for a while (fasting). You may have this done every 1-3 years.  Bone density scan. This is done to screen for osteoporosis. You may have this done starting at age 37.  Mammogram. This may be done every 1-2 years. Talk to your health care provider about how often you should have regular mammograms. Talk with your health care provider about your test results, treatment options, and if necessary, the need  for more tests. Vaccines  Your health care provider may recommend certain vaccines, such as:  Influenza vaccine. This is recommended every year.  Tetanus, diphtheria, and acellular pertussis (Tdap, Td)  vaccine. You may need a Td booster every 10 years.  Zoster vaccine. You may need this after age 62.  Pneumococcal 13-valent conjugate (PCV13) vaccine. One dose is recommended after age 32.  Pneumococcal polysaccharide (PPSV23) vaccine. One dose is recommended after age 67. Talk to your health care provider about which screenings and vaccines you need and how often you need them. This information is not intended to replace advice given to you by your health care provider. Make sure you discuss any questions you have with your health care provider. Document Released: 06/29/2015 Document Revised: 02/20/2016 Document Reviewed: 04/03/2015 Elsevier Interactive Patient Education  2017 [place] Prevention in the Home Falls can cause injuries. They can happen to people of all ages. There are many things you can do to make your home safe and to help prevent falls. What can I do on the outside of my home?  Regularly fix the edges of walkways and driveways and fix any cracks.  Remove anything that might make you trip as you walk through a door, such as a raised step or threshold.  Trim any bushes or trees on the path to your home.  Use bright outdoor lighting.  Clear any walking paths of anything that might make someone trip, such as rocks or tools.  Regularly check to see if handrails are loose or broken. Make sure that both sides of any steps have handrails.  Any raised decks and porches should have guardrails on the edges.  Have any leaves, snow, or ice cleared regularly.  Use sand or salt on walking paths during winter.  Clean up any spills in your garage right away. This includes oil or grease spills. What can I do in the bathroom?  Use night lights.  Install grab bars by the toilet and in the tub and shower. Do not use towel bars as grab bars.  Use non-skid mats or decals in the tub or shower.  If you need to sit down in the shower, use a plastic, non-slip  stool.  Keep the floor dry. Clean up any water that spills on the floor as soon as it happens.  Remove soap buildup in the tub or shower regularly.  Attach bath mats securely with double-sided non-slip rug tape.  Do not have throw rugs and other things on the floor that can make you trip. What can I do in the bedroom?  Use night lights.  Make sure that you have a light by your bed that is easy to reach.  Do not use any sheets or blankets that are too big for your bed. They should not hang down onto the floor.  Have a firm chair that has side arms. You can use this for support while you get dressed.  Do not have throw rugs and other things on the floor that can make you trip. What can I do in the kitchen?  Clean up any spills right away.  Avoid walking on wet floors.  Keep items that you use a lot in easy-to-reach places.  If you need to reach something above you, use a strong step stool that has a grab bar.  Keep electrical cords out of the way.  Do not use floor polish or wax that makes floors slippery. If you must use wax, use  non-skid floor wax.  Do not have throw rugs and other things on the floor that can make you trip. What can I do with my stairs?  Do not leave any items on the stairs.  Make sure that there are handrails on both sides of the stairs and use them. Fix handrails that are broken or loose. Make sure that handrails are as long as the stairways.  Check any carpeting to make sure that it is firmly attached to the stairs. Fix any carpet that is loose or worn.  Avoid having throw rugs at the top or bottom of the stairs. If you do have throw rugs, attach them to the floor with carpet tape.  Make sure that you have a light switch at the top of the stairs and the bottom of the stairs. If you do not have them, ask someone to add them for you. What else can I do to help prevent falls?  Wear shoes that:  Do not have high heels.  Have rubber bottoms.  Are  comfortable and fit you well.  Are closed at the toe. Do not wear sandals.  If you use a stepladder:  Make sure that it is fully opened. Do not climb a closed stepladder.  Make sure that both sides of the stepladder are locked into place.  Ask someone to hold it for you, if possible.  Clearly mark and make sure that you can see:  Any grab bars or handrails.  First and last steps.  Where the edge of each step is.  Use tools that help you move around (mobility aids) if they are needed. These include:  Canes.  Walkers.  Scooters.  Crutches.  Turn on the lights when you go into a dark area. Replace any light bulbs as soon as they burn out.  Set up your furniture so you have a clear path. Avoid moving your furniture around.  If any of your floors are uneven, fix them.  If there are any pets around you, be aware of where they are.  Review your medicines with your doctor. Some medicines can make you feel dizzy. This can increase your chance of falling. Ask your doctor what other things that you can do to help prevent falls. This information is not intended to replace advice given to you by your health care provider. Make sure you discuss any questions you have with your health care provider. Document Released: 03/29/2009 Document Revised: 11/08/2015 Document Reviewed: 07/07/2014 Elsevier Interactive Patient Education  2017 [employer].

## 2020-08-29 NOTE — Patient Instructions (Addendum)
TAKE OLMESARTAN AND HYDROCHLOROTHIAZIDE AT 9AM WITH BREAKFAST  TAKE AMLODIPINE '10MG'$  AND PRAVASTATIN '40MG'$  AT DINNER ABOUT 7PM.    Cooking With Less Salt Cooking with less salt is one way to reduce the amount of sodium you get from food. Sodium is one of the elements that make up salt. It is found naturally in foods and is also added to certain foods. Depending on your condition and overall health, your health care provider or dietitian may recommend that you reduce your sodium intake. Most people should have less than 2,300 milligrams (mg) of sodium each day. If you have high blood pressure (hypertension), you may need to limit your sodium to 1,500 mg each day. Follow the tips below to help reduce your sodium intake. What are tips for eating less sodium? Reading food labels  Check the food label before buying or using packaged ingredients. Always check the label for the serving size and sodium content.  Look for products with no more than 140 mg of sodium in one serving.  Check the % Daily Value column to see what percent of the daily recommended amount of sodium is provided in one serving of the product. Foods with 5% or less in this column are considered low in sodium. Foods with 20% or higher are considered high in sodium.  Do not choose foods with salt as one of the first three ingredients on the ingredients list. If salt is one of the first three ingredients, it usually means the item is high in sodium.   Shopping  Buy sodium-free or low-sodium products. Look for the following words on food labels: ? Low-sodium. ? Sodium-free. ? Reduced-sodium. ? No salt added. ? Unsalted.  Always check the sodium content even if foods are labeled as low-sodium or no salt added.  Buy fresh foods. Cooking  Use herbs, seasonings without salt, and spices as substitutes for salt.  Use sodium-free baking soda when baking.  Grill, braise, or roast foods to add flavor with less salt.  Avoid adding  salt to pasta, rice, or hot cereals.  Drain and rinse canned vegetables, beans, and meat before use.  Avoid adding salt when cooking sweets and desserts.  Cook with low-sodium ingredients. What foods are high in sodium? Vegetables Regular canned vegetables (not low-sodium or reduced-sodium). Sauerkraut, pickled vegetables, and relishes. Olives. [place] fries. Onion rings. Regular canned tomato sauce and paste. Regular tomato and vegetable juice. Frozen vegetables in sauces. Grains Instant hot cereals. Bread stuffing, pancake, and biscuit mixes. Croutons. Seasoned rice or pasta mixes. Noodle soup cups. Boxed or frozen macaroni and cheese. Regular salted crackers. Self-rising flour. Rolls. Bagels. Flour tortillas and wraps. Meats and other proteins Meat or fish that is salted, canned, smoked, cured, spiced, or pickled. This includes bacon, ham, sausages, hot dogs, corned beef, chipped beef, meat loaves, salt pork, jerky, pickled herring, anchovies, regular canned tuna, and sardines. Salted nuts. Dairy Processed cheese and cheese spreads. Cheese curds. Blue cheese. Feta cheese. String cheese. Regular cottage cheese. Buttermilk. Canned milk. The items listed above may not be a complete list of foods high in sodium. Actual amounts of sodium may be different depending on processing. Contact a dietitian for more information. What foods are low in sodium? Fruits Fresh, frozen, or canned fruit with no sauce added. Fruit juice. Vegetables Fresh or frozen vegetables with no sauce added. "No salt added" canned vegetables. "No salt added" tomato sauce and paste. Low-sodium or reduced-sodium tomato and vegetable juice. Grains Noodles, pasta, quinoa, rice. Shredded or  puffed wheat or puffed rice. Regular or quick oats (not instant). Low-sodium crackers. Low-sodium bread. Whole-grain bread and whole-grain pasta. Unsalted popcorn. Meats and other proteins Fresh or frozen whole meats, poultry (not injected  with sodium), and fish with no sauce added. Unsalted nuts. Dried peas, beans, and lentils without added salt. Unsalted canned beans. Eggs. Unsalted nut butters. Low-sodium canned tuna or chicken. Dairy Milk. Soy milk. Yogurt. Low-sodium cheeses, such as Swiss, Monterey Jack, [place], and [employer]. Sherbet or ice cream (keep to  cup per serving). Cream cheese. Fats and oils Unsalted butter or margarine. Other foods Homemade pudding. Sodium-free baking soda and baking powder. Herbs and spices. Low-sodium seasoning mixes. Beverages Coffee and tea. Carbonated beverages. The items listed above may not be a complete list of foods low in sodium. Actual amounts of sodium may be different depending on processing. Contact a dietitian for more information. What are some salt alternatives when cooking? The following are herbs, seasonings, and spices that can be used instead of salt to flavor your food. Herbs should be fresh or dried. Do not choose packaged mixes. Next to the name of the herb, spice, or seasoning are some examples of foods you can pair it with. Herbs  Bay leaves - Soups, meat and vegetable dishes, and spaghetti sauce.  Basil - [employer], soups, pasta, and fish dishes.  Cilantro - Meat, poultry, and vegetable dishes.  Chili powder - Marinades and Mexican dishes.  Chives - Salad dressings and potato dishes.  Cumin - Mexican dishes, couscous, and meat dishes.  Dill - Fish dishes, sauces, and salads.  Fennel - Meat and vegetable dishes, breads, and cookies.  Garlic (do not use garlic salt) - [place] dishes, meat dishes, salad dressings, and sauces.  Marjoram - Soups, potato dishes, and meat dishes.  Oregano - Pizza and spaghetti sauce.  Parsley - Salads, soups, pasta, and meat dishes.  Rosemary - [place] dishes, salad dressings, soups, and red meats.  Saffron - Fish dishes, pasta, and some poultry dishes.  Sage - Stuffings and sauces.  Tarragon - Fish and [employer].  Thyme - Stuffing, meat, and fish dishes. Seasonings  Lemon juice - Fish dishes, poultry dishes, vegetables, and salads.  Vinegar - Salad dressings, vegetables, and fish dishes. Spices  Cinnamon - Sweet dishes, such as cakes, cookies, and puddings.  Cloves - Gingerbread, puddings, and marinades for meats.  Curry - Vegetable dishes, fish and poultry dishes, and stir-fry dishes.  Ginger - Vegetable dishes, fish dishes, and stir-fry dishes.  Nutmeg - Pasta, vegetables, poultry, fish dishes, and custard. Summary  Cooking with less salt is one way to reduce the amount of sodium that you get from food.  Buy sodium-free or low-sodium products.  Check the food label before using or buying packaged ingredients.  Use herbs, seasonings without salt, and spices as substitutes for salt in foods. This information is not intended to replace advice given to you by your health care provider. Make sure you discuss any questions you have with your health care provider. Document Revised: 05/25/2019 Document Reviewed: 05/25/2019 Elsevier Patient Education  [place].

## 2020-08-30 LAB — CMP14+EGFR
ALT: 10 IU/L (ref 0–32)
AST: 19 IU/L (ref 0–40)
Albumin/Globulin Ratio: 1.4 (ref 1.2–2.2)
Albumin: 4.1 g/dL (ref 3.6–4.6)
Alkaline Phosphatase: 65 IU/L (ref 44–121)
BUN/Creatinine Ratio: 18 (ref 12–28)
BUN: 29 mg/dL — ABNORMAL HIGH (ref 8–27)
Bilirubin Total: 0.3 mg/dL (ref 0.0–1.2)
CO2: 23 mmol/L (ref 20–29)
Calcium: 9.3 mg/dL (ref 8.7–10.3)
Chloride: 97 mmol/L (ref 96–106)
Creatinine, Ser: 1.63 mg/dL — ABNORMAL HIGH (ref 0.57–1.00)
Globulin, Total: 2.9 g/dL (ref 1.5–4.5)
Glucose: 90 mg/dL (ref 65–99)
Potassium: 3.8 mmol/L (ref 3.5–5.2)
Sodium: 137 mmol/L (ref 134–144)
Total Protein: 7 g/dL (ref 6.0–8.5)
eGFR: 30 mL/min/{1.73_m2} — ABNORMAL LOW (ref >59)

## 2020-08-30 LAB — HEMOGLOBIN A1C
Est. average glucose Bld gHb Est-mCnc: 131 mg/dL
Hgb A1c MFr Bld: 6.2 % — ABNORMAL HIGH (ref 4.8–5.6)

## 2020-09-04 ENCOUNTER — Other Ambulatory Visit: Payer: Self-pay | Admitting: Internal Medicine

## 2020-09-07 ENCOUNTER — Other Ambulatory Visit: Payer: Self-pay | Admitting: Internal Medicine

## 2020-09-07 ENCOUNTER — Other Ambulatory Visit: Payer: Self-pay

## 2020-09-07 DIAGNOSIS — N1832 Chronic kidney disease, stage 3b: Secondary | ICD-10-CM

## 2020-09-18 ENCOUNTER — Ambulatory Visit: Payer: Medicare PPO

## 2020-09-18 ENCOUNTER — Other Ambulatory Visit: Payer: Self-pay

## 2020-09-18 VITALS — BP 128/78 | HR 97 | Temp 98.2°F | Ht 64.0 in | Wt 148.0 lb

## 2020-09-18 DIAGNOSIS — E7800 Pure hypercholesterolemia, unspecified: Secondary | ICD-10-CM

## 2020-09-18 NOTE — Progress Notes (Signed)
Pt is here today for a BPC, she is taking her medication as needed. Pt had a question about her hydrochlorothiazide, she was told to take only half of the pill the last time she was seen on 08/29/2020. She is currently still taking half of the pill which she refilled last month. Pt wants to know if she should continue taking half or 2 as once directed before.  BP Readings from Last 3 Encounters:  09/18/20 128/78  08/29/20 (!) 156/90  08/29/20 (!) 156/90

## 2020-09-19 ENCOUNTER — Telehealth: Payer: Self-pay

## 2020-09-19 NOTE — Telephone Encounter (Signed)
Called pt to notify her that it was okay to take medication as previously directed from provider.

## 2020-09-22 ENCOUNTER — Other Ambulatory Visit: Payer: Self-pay | Admitting: Internal Medicine

## 2020-09-26 ENCOUNTER — Other Ambulatory Visit: Payer: Self-pay | Admitting: Internal Medicine

## 2020-10-08 ENCOUNTER — Other Ambulatory Visit: Payer: Self-pay | Admitting: Internal Medicine

## 2020-10-14 ENCOUNTER — Other Ambulatory Visit: Payer: Self-pay | Admitting: Internal Medicine

## 2020-10-15 DIAGNOSIS — N2581 Secondary hyperparathyroidism of renal origin: Secondary | ICD-10-CM | POA: Diagnosis not present

## 2020-10-15 DIAGNOSIS — N1832 Chronic kidney disease, stage 3b: Secondary | ICD-10-CM | POA: Diagnosis not present

## 2020-10-15 DIAGNOSIS — D631 Anemia in chronic kidney disease: Secondary | ICD-10-CM | POA: Diagnosis not present

## 2020-10-15 DIAGNOSIS — E1122 Type 2 diabetes mellitus with diabetic chronic kidney disease: Secondary | ICD-10-CM | POA: Diagnosis not present

## 2020-10-15 DIAGNOSIS — N189 Chronic kidney disease, unspecified: Secondary | ICD-10-CM | POA: Diagnosis not present

## 2020-10-15 DIAGNOSIS — I129 Hypertensive chronic kidney disease with stage 1 through stage 4 chronic kidney disease, or unspecified chronic kidney disease: Secondary | ICD-10-CM | POA: Diagnosis not present

## 2020-10-16 ENCOUNTER — Other Ambulatory Visit: Payer: Self-pay | Admitting: Nephrology

## 2020-10-16 DIAGNOSIS — N1832 Chronic kidney disease, stage 3b: Secondary | ICD-10-CM

## 2020-10-24 ENCOUNTER — Encounter: Payer: Self-pay | Admitting: Internal Medicine

## 2020-11-28 ENCOUNTER — Ambulatory Visit
Admission: RE | Admit: 2020-11-28 | Discharge: 2020-11-28 | Disposition: A | Discharge status: Home / Self Care | Payer: Medicare PPO | Source: Ambulatory Visit | Attending: Nephrology | Admitting: Nephrology

## 2020-11-28 DIAGNOSIS — N281 Cyst of kidney, acquired: Secondary | ICD-10-CM | POA: Diagnosis not present

## 2020-11-28 DIAGNOSIS — N1832 Chronic kidney disease, stage 3b: Secondary | ICD-10-CM

## 2020-12-05 ENCOUNTER — Other Ambulatory Visit: Payer: Self-pay | Admitting: Nephrology

## 2020-12-05 DIAGNOSIS — N281 Cyst of kidney, acquired: Secondary | ICD-10-CM

## 2020-12-17 ENCOUNTER — Other Ambulatory Visit: Payer: Self-pay | Admitting: Internal Medicine

## 2020-12-26 ENCOUNTER — Inpatient Hospital Stay: Admission: RE | Admit: 2020-12-26 | Payer: Medicare PPO | Source: Ambulatory Visit

## 2021-01-09 ENCOUNTER — Encounter: Payer: Self-pay | Admitting: Internal Medicine

## 2021-01-09 ENCOUNTER — Other Ambulatory Visit: Payer: Self-pay

## 2021-01-09 ENCOUNTER — Ambulatory Visit (INDEPENDENT_AMBULATORY_CARE_PROVIDER_SITE_OTHER): Payer: Medicare PPO | Admitting: Internal Medicine

## 2021-01-09 VITALS — BP 134/80 | HR 93 | Temp 98.6°F | Ht 64.0 in | Wt 145.0 lb

## 2021-01-09 DIAGNOSIS — E1122 Type 2 diabetes mellitus with diabetic chronic kidney disease: Secondary | ICD-10-CM

## 2021-01-09 DIAGNOSIS — E7800 Pure hypercholesterolemia, unspecified: Secondary | ICD-10-CM | POA: Diagnosis not present

## 2021-01-09 DIAGNOSIS — I129 Hypertensive chronic kidney disease with stage 1 through stage 4 chronic kidney disease, or unspecified chronic kidney disease: Secondary | ICD-10-CM

## 2021-01-09 DIAGNOSIS — Z0000 Encounter for general adult medical examination without abnormal findings: Secondary | ICD-10-CM | POA: Diagnosis not present

## 2021-01-09 DIAGNOSIS — N1831 Chronic kidney disease, stage 3a: Secondary | ICD-10-CM

## 2021-01-09 LAB — POCT URINALYSIS DIPSTICK
Bilirubin, UA: NEGATIVE
Blood, UA: NEGATIVE
Glucose, UA: NEGATIVE
Ketones, UA: NEGATIVE
Nitrite, UA: NEGATIVE
Protein, UA: POSITIVE — AB
Spec Grav, UA: 1.015 (ref 1.010–1.025)
Urobilinogen, UA: 0.2 E.U./dL
pH, UA: 5.5 (ref 5.0–8.0)

## 2021-01-09 LAB — POCT UA - MICROALBUMIN
Creatinine, POC: 300 mg/dL
Microalbumin Ur, POC: 150 mg/L

## 2021-01-09 MED ORDER — AMLODIPINE BESYLATE 10 MG PO TABS: 10.0000 mg | ORAL_TABLET | Freq: Every day | ORAL | 2 refills | Status: DC

## 2021-01-09 MED ORDER — OLMESARTAN MEDOXOMIL 40 MG PO TABS: 40.0000 mg | ORAL_TABLET | Freq: Every day | ORAL | 2 refills | Status: DC

## 2021-01-09 NOTE — Progress Notes (Signed)
I,Shannon Mcconnell,acting as a Education administrator for Shannon Greenland, MD.,have documented all relevant documentation on the behalf of Shannon Greenland, MD,as directed by  Shannon Greenland, MD while in the presence of Shannon Greenland, MD.  This visit occurred during the SARS-CoV-2 public health emergency.  Safety protocols were in place, including screening questions prior to the visit, additional usage of staff PPE, and extensive cleaning of exam room while observing appropriate contact time as indicated for disinfecting solutions.  Subjective:     Patient ID: Shannon Mcconnell , female    DOB: [birth year] , 87 y.o.   MRN: [id number]   Chief Complaint  Patient presents with   Annual Exam   Diabetes   Hypertension    HPI  The patient is here for a physical exam.  She is no longer followed by GYN. She has no specific concerns or complaints at this time.  She reports compliance with meds. She reports having good energy, states her appetite has improved.   Hypertension This is a chronic problem. The current episode started more than 1 year ago. The problem has been gradually improving since onset. The problem is controlled. Pertinent negatives include no blurred vision, chest pain, palpitations or shortness of breath. Past treatments include calcium channel blockers, angiotensin blockers and diuretics. The current treatment provides moderate improvement. There are no compliance problems.  Hypertensive end-organ damage includes kidney disease.  Diabetes She presents for her follow-up diabetic visit. She has type 2 diabetes mellitus. Pertinent negatives for diabetes include no blurred vision and no chest pain. She has not had a previous visit with a dietitian. She participates in exercise intermittently. An ACE inhibitor/angiotensin II receptor blocker is being taken.    Past Medical History:  Diagnosis Date   DM (diabetes mellitus) ([place])    High cholesterol    Hypertension      Family History  Problem  Relation Age of Onset   Hypertension Mother    Hypertension Father      Current Outpatient Medications:    amLODipine (NORVASC) 10 MG tablet, Take 1 tablet (10 mg total) by mouth daily., Disp: 90 tablet, Rfl: 2   Blood Glucose Monitoring Suppl (TRUE METRIX METER) w/Device KIT, USE AS DIRECTED TO CHECK BLOOD SUGAR TWICE DAILY DX: E11.65, Disp: 1 kit, Rfl: 1   Cholecalciferol (VITAMIN D3) 50 MCG (2000 UT) capsule, TAKE 1 CAPSULE BY MOUTH TWICE DAILY **DISCONTINUE  VITAMIN  D2  50,000  IU**, Disp: 60 capsule, Rfl: 0   olmesartan (BENICAR) 40 MG tablet, Take 1 tablet (40 mg total) by mouth daily., Disp: 90 tablet, Rfl: 2   pravastatin (PRAVACHOL) 20 MG tablet, Take 1 tablet by mouth once daily, Disp: 90 tablet, Rfl: 0   RELION TRUE METRIX TEST STRIPS test strip, USE AS DIRECTED TWICE DAILY, Disp: 50 each, Rfl: 0   hydrochlorothiazide (MICROZIDE) 12.5 MG capsule, Take 1 capsule (12.5 mg total) by mouth daily. Patient does not need right now, please keep on file and d/c 59m dose, Disp: 90 capsule, Rfl: 2   No Known Allergies    The patient states she uses none for birth control. Last LMP was No LMP recorded. Patient has had a hysterectomy.. Negative for Dysmenorrhea. Negative for: breast discharge, breast lump(s), breast pain and breast self exam. Associated symptoms include abnormal vaginal bleeding. Pertinent negatives include abnormal bleeding (hematology), anxiety, decreased libido, depression, difficulty falling sleep, dyspareunia, history of infertility, nocturia, sexual dysfunction, sleep disturbances, urinary incontinence, urinary urgency, vaginal discharge and vaginal  itching. Diet regular.The patient states her exercise level is    . The patient's tobacco use is:  Social History   Tobacco Use  Smoking Status Never  Smokeless Tobacco Never  . She has been exposed to passive smoke. The patient's alcohol use is:  Social History   Substance and Sexual Activity  Alcohol Use Never     Review of Systems  Constitutional: Negative.   HENT: Negative.    Eyes: Negative.  Negative for blurred vision.  Respiratory: Negative.  Negative for shortness of breath.   Cardiovascular: Negative.  Negative for chest pain and palpitations.  Gastrointestinal: Negative.   Endocrine: Negative.   Genitourinary: Negative.   Musculoskeletal: Negative.   Skin: Negative.   Allergic/Immunologic: Negative.   Neurological: Negative.   Hematological: Negative.   Psychiatric/Behavioral: Negative.      Today's Vitals   01/09/21 1134  BP: 134/80  Pulse: 93  Temp: 98.6 F (37 C)  TempSrc: Oral  Weight: 145 lb (65.8 kg)  Height: 5' 4" (1.626 m)  PainSc: 0-No pain   Body mass index is 24.89 kg/m.  Wt Readings from Last 3 Encounters:  01/09/21 145 lb (65.8 kg)  09/18/20 148 lb (67.1 kg)  08/29/20 148 lb (67.1 kg)    BP Readings from Last 3 Encounters:  01/09/21 134/80  09/18/20 128/78  08/29/20 (!) 156/90    Objective:  Physical Exam Vitals and nursing note reviewed.  Constitutional:      Appearance: Normal appearance.  HENT:     Head: Normocephalic and atraumatic.     Right Ear: Tympanic membrane, ear canal and external ear normal.     Left Ear: Tympanic membrane, ear canal and external ear normal.     Nose:     Comments: Masked     Mouth/Throat:     Comments: Masked  Eyes:     Extraocular Movements: Extraocular movements intact.     Conjunctiva/sclera: Conjunctivae normal.     Pupils: Pupils are equal, round, and reactive to light.  Cardiovascular:     Rate and Rhythm: Normal rate and regular rhythm.     Pulses: Normal pulses.          Dorsalis pedis pulses are 2+ on the right side and 2+ on the left side.     Heart sounds: Normal heart sounds.  Pulmonary:     Effort: Pulmonary effort is normal.     Breath sounds: Normal breath sounds.  Chest:  Breasts:    Right: Normal.     Comments: Left s/p mastectomy Abdominal:     General: Abdomen is flat. Bowel sounds  are normal.     Palpations: Abdomen is soft.  Genitourinary:    Comments: deferred Musculoskeletal:        General: Normal range of motion.     Cervical back: Normal range of motion and neck supple.  Feet:     Right foot:     Protective Sensation: 5 sites tested.  5 sites sensed.     Skin integrity: Dry skin present.     Toenail Condition: Right toenails are long.     Left foot:     Protective Sensation: 5 sites tested.  5 sites sensed.     Skin integrity: Dry skin present.     Toenail Condition: Left toenails are long.  Skin:    General: Skin is warm and dry.  Neurological:     General: No focal deficit present.     Mental Status: She is  alert and oriented to person, place, and time.  Psychiatric:        Mood and Affect: Mood normal.        Behavior: Behavior normal.        Assessment And Plan:     1. Routine general medical examination at a health care facility Comments: A full exam was performed. Importance of monthly self breast exams was discussed with the patient. PATIENT IS ADVISED TO GET 30-45 MINUTES REGULAR EXERCISE NO LESS THAN FOUR TO FIVE DAYS PER WEEK - BOTH WEIGHTBEARING EXERCISES AND AEROBIC ARE RECOMMENDED.  PATIENT IS ADVISED TO FOLLOW A HEALTHY DIET WITH AT LEAST SIX FRUITS/VEGGIES PER DAY, DECREASE INTAKE OF RED MEAT, AND TO INCREASE FISH INTAKE TO TWO DAYS PER WEEK.  MEATS/FISH SHOULD NOT BE FRIED, BAKED OR BROILED IS PREFERABLE.  IT IS ALSO IMPORTANT TO CUT BACK ON YOUR SUGAR INTAKE. PLEASE AVOID ANYTHING WITH ADDED SUGAR, CORN SYRUP OR OTHER SWEETENERS. IF YOU MUST USE A SWEETENER, YOU CAN TRY STEVIA. IT IS ALSO IMPORTANT TO AVOID ARTIFICIALLY SWEETENERS AND DIET BEVERAGES. LASTLY, I SUGGEST WEARING SPF 50 SUNSCREEN ON EXPOSED PARTS AND ESPECIALLY WHEN IN THE DIRECT SUNLIGHT FOR AN EXTENDED PERIOD OF TIME.  PLEASE AVOID FAST FOOD RESTAURANTS AND INCREASE YOUR WATER INTAKE.   2. Hypertensive nephropathy Comments: Chronic, controlled. EKG performed, NSR w/  voltage criteria for LVH, LAFB, neg T waves -no new changes. She is encouraged to follow low sodium diet. She will rto in 4-6 months for re-evaluation.  - EKG 12-Lead - CMP14+EGFR  3. Type 2 diabetes mellitus with stage 3a chronic kidney disease, without long-term current use of insulin ([facility]) Comments: Diabetic foot exam was performed. I DISCUSSED WITH THE PATIENT AT LENGTH REGARDING THE GOALS OF GLYCEMIC CONTROL AND POSSIBLE LONG-TERM COMPLICATIONS.  I  ALSO STRESSED THE IMPORTANCE OF COMPLIANCE WITH HOME GLUCOSE MONITORING, DIETARY RESTRICTIONS INCLUDING AVOIDANCE OF SUGARY DRINKS/PROCESSED FOODS,  ALONG WITH REGULAR EXERCISE.  I  ALSO STRESSED THE IMPORTANCE OF ANNUAL EYE EXAMS, SELF FOOT CARE AND COMPLIANCE WITH OFFICE VISITS.  - POCT UA - Microalbumin - POCT Urinalysis Dipstick (81002) - Hemoglobin A1c - CBC - Lipid panel - CMP14+EGFR - Protein electrophoresis, serum  4. Pure hypercholesterolemia Comments: Chronic, I will check lipid panel today. She is encouraged to limit fried food intake, and perform more exercise - including chair exercises.   Patient was given opportunity to ask questions. Patient verbalized understanding of the plan and was able to repeat key elements of the plan. All questions were answered to their satisfaction.   I, Shannon Greenland, MD, have reviewed all documentation for this visit. The documentation on 01/13/21 for the exam, diagnosis, procedures, and orders are all accurate and complete.  THE PATIENT IS ENCOURAGED TO PRACTICE SOCIAL DISTANCING DUE TO THE COVID-19 PANDEMIC.

## 2021-01-09 NOTE — Patient Instructions (Signed)
Health Maintenance, Female Adopting a healthy lifestyle and getting preventive care are important in promoting health and wellness. Ask your health care provider about: The right schedule for you to have regular tests and exams. Things you can do on your own to prevent diseases and keep yourself healthy. What should I know about diet, weight, and exercise? Eat a healthy diet  Eat a diet that includes plenty of vegetables, fruits, low-fat dairy products, and lean protein. Do not eat a lot of foods that are high in solid fats, added sugars, or sodium.  Maintain a healthy weight Body mass index (BMI) is used to identify weight problems. It estimates body fat based on height and weight. Your health care provider can help determineyour BMI and help you achieve or maintain a healthy weight. Get regular exercise Get regular exercise. This is one of the most important things you can do for your health. Most adults should: Exercise for at least 150 minutes each week. The exercise should increase your heart rate and make you sweat (moderate-intensity exercise). Do strengthening exercises at least twice a week. This is in addition to the moderate-intensity exercise. Spend less time sitting. Even light physical activity can be beneficial. Watch cholesterol and blood lipids Have your blood tested for lipids and cholesterol at 85 years of age, then havethis test every 5 years. Have your cholesterol levels checked more often if: Your lipid or cholesterol levels are high. You are older than 85 years of age. You are at high risk for heart disease. What should I know about cancer screening? Depending on your health history and family history, you may need to have cancer screening at various ages. This may include screening for: Breast cancer. Cervical cancer. Colorectal cancer. Skin cancer. Lung cancer. What should I know about heart disease, diabetes, and high blood pressure? Blood pressure and heart  disease High blood pressure causes heart disease and increases the risk of stroke. This is more likely to develop in people who have high blood pressure readings, are of African descent, or are overweight. Have your blood pressure checked: Every 3-5 years if you are 18-39 years of age. Every year if you are 40 years old or older. Diabetes Have regular diabetes screenings. This checks your fasting blood sugar level. Have the screening done: Once every three years after age 40 if you are at a normal weight and have a low risk for diabetes. More often and at a younger age if you are overweight or have a high risk for diabetes. What should I know about preventing infection? Hepatitis B If you have a higher risk for hepatitis B, you should be screened for this virus. Talk with your health care provider to find out if you are at risk forhepatitis B infection. Hepatitis C Testing is recommended for: Everyone born from 1945 through 1965. Anyone with known risk factors for hepatitis C. Sexually transmitted infections (STIs) Get screened for STIs, including gonorrhea and chlamydia, if: You are sexually active and are younger than 85 years of age. You are older than 85 years of age and your health care provider tells you that you are at risk for this type of infection. Your sexual activity has changed since you were last screened, and you are at increased risk for chlamydia or gonorrhea. Ask your health care provider if you are at risk. Ask your health care provider about whether you are at high risk for HIV. Your health care provider may recommend a prescription medicine to help   prevent HIV infection. If you choose to take medicine to prevent HIV, you should first get tested for HIV. You should then be tested every 3 months for as long as you are taking the medicine. Pregnancy If you are about to stop having your period (premenopausal) and you may become pregnant, seek counseling before you get  pregnant. Take 400 to 800 micrograms (mcg) of folic acid every day if you become pregnant. Ask for birth control (contraception) if you want to prevent pregnancy. Osteoporosis and menopause Osteoporosis is a disease in which the bones lose minerals and strength with aging. This can result in bone fractures. If you are 65 years old or older, or if you are at risk for osteoporosis and fractures, ask your health care provider if you should: Be screened for bone loss. Take a calcium or vitamin D supplement to lower your risk of fractures. Be given hormone replacement therapy (HRT) to treat symptoms of menopause. Follow these instructions at home: Lifestyle Do not use any products that contain nicotine or tobacco, such as cigarettes, e-cigarettes, and chewing tobacco. If you need help quitting, ask your health care provider. Do not use street drugs. Do not share needles. Ask your health care provider for help if you need support or information about quitting drugs. Alcohol use Do not drink alcohol if: Your health care provider tells you not to drink. You are pregnant, may be pregnant, or are planning to become pregnant. If you drink alcohol: Limit how much you use to 0-1 drink a day. Limit intake if you are breastfeeding. Be aware of how much alcohol is in your drink. In the [place] one drink equals one 12 oz bottle of beer (355 mL), one 5 oz glass of wine (148 mL), or one 1 oz glass of hard liquor (44 mL). General instructions Schedule regular health, dental, and eye exams. Stay current with your vaccines. Tell your health care provider if: You often feel depressed. You have ever been abused or do not feel safe at home. Summary Adopting a healthy lifestyle and getting preventive care are important in promoting health and wellness. Follow your health care provider's instructions about healthy diet, exercising, and getting tested or screened for diseases. Follow your health care provider's  instructions on monitoring your cholesterol and blood pressure. This information is not intended to replace advice given to you by your health care provider. Make sure you discuss any questions you have with your healthcare provider. Document Revised: 05/26/2018 Document Reviewed: 05/26/2018 Elsevier Patient Education  2022 Elsevier Inc.  

## 2021-01-10 LAB — LIPID PANEL
Chol/HDL Ratio: 2.8 ratio (ref 0.0–4.4)
Cholesterol, Total: 190 mg/dL (ref 100–199)
HDL: 69 mg/dL (ref >39)
LDL Chol Calc (NIH): 107 mg/dL — ABNORMAL HIGH (ref 0–99)
Triglycerides: 78 mg/dL (ref 0–149)
VLDL Cholesterol Cal: 14 mg/dL (ref 5–40)

## 2021-01-11 ENCOUNTER — Encounter: Payer: Self-pay | Admitting: Internal Medicine

## 2021-01-11 LAB — CMP14+EGFR
ALT: 12 IU/L (ref 0–32)
AST: 23 IU/L (ref 0–40)
Albumin/Globulin Ratio: 1.4 (ref 1.2–2.2)
Albumin: 4.1 g/dL (ref 3.6–4.6)
Alkaline Phosphatase: 61 IU/L (ref 44–121)
BUN/Creatinine Ratio: 15 (ref 12–28)
BUN: 22 mg/dL (ref 8–27)
Bilirubin Total: 0.5 mg/dL (ref 0.0–1.2)
CO2: 26 mmol/L (ref 20–29)
Calcium: 9.4 mg/dL (ref 8.7–10.3)
Chloride: 93 mmol/L — ABNORMAL LOW (ref 96–106)
Creatinine, Ser: 1.44 mg/dL — ABNORMAL HIGH (ref 0.57–1.00)
Globulin, Total: 3 g/dL (ref 1.5–4.5)
Glucose: 113 mg/dL — ABNORMAL HIGH (ref 65–99)
Potassium: 4 mmol/L (ref 3.5–5.2)
Sodium: 131 mmol/L — ABNORMAL LOW (ref 134–144)
Total Protein: 7.1 g/dL (ref 6.0–8.5)
eGFR: 35 mL/min/{1.73_m2} — ABNORMAL LOW (ref >59)

## 2021-01-11 LAB — CBC
Hematocrit: 35.4 % (ref 34.0–46.6)
Hemoglobin: 11.5 g/dL (ref 11.1–15.9)
MCH: 29.5 pg (ref 26.6–33.0)
MCHC: 32.5 g/dL (ref 31.5–35.7)
MCV: 91 fL (ref 79–97)
Platelets: 276 10*3/uL (ref 150–450)
RBC: 3.9 x10E6/uL (ref 3.77–5.28)
RDW: 13.7 % (ref 11.7–15.4)
WBC: 5.2 10*3/uL (ref 3.4–10.8)

## 2021-01-11 LAB — PROTEIN ELECTROPHORESIS, SERUM
A/G Ratio: 1.3 (ref 0.7–1.7)
Albumin ELP: 4 g/dL (ref 2.9–4.4)
Alpha 1: 0.2 g/dL (ref 0.0–0.4)
Alpha 2: 0.7 g/dL (ref 0.4–1.0)
Beta: 1.1 g/dL (ref 0.7–1.3)
Gamma Globulin: 1.1 g/dL (ref 0.4–1.8)
Globulin, Total: 3.1 g/dL (ref 2.2–3.9)

## 2021-01-11 LAB — HEMOGLOBIN A1C
Est. average glucose Bld gHb Est-mCnc: 123 mg/dL
Hgb A1c MFr Bld: 5.9 % — ABNORMAL HIGH (ref 4.8–5.6)

## 2021-01-15 ENCOUNTER — Ambulatory Visit
Admission: RE | Admit: 2021-01-15 | Discharge: 2021-01-15 | Disposition: A | Discharge status: Home / Self Care | Payer: Medicare PPO | Source: Ambulatory Visit | Attending: Nephrology | Admitting: Nephrology

## 2021-01-15 ENCOUNTER — Other Ambulatory Visit: Payer: Self-pay

## 2021-01-15 ENCOUNTER — Inpatient Hospital Stay: Admission: RE | Admit: 2021-01-15 | Payer: Medicare PPO | Source: Ambulatory Visit

## 2021-01-15 DIAGNOSIS — N281 Cyst of kidney, acquired: Secondary | ICD-10-CM | POA: Diagnosis not present

## 2021-01-15 DIAGNOSIS — R590 Localized enlarged lymph nodes: Secondary | ICD-10-CM | POA: Diagnosis not present

## 2021-01-15 DIAGNOSIS — I700 Atherosclerosis of aorta: Secondary | ICD-10-CM | POA: Diagnosis not present

## 2021-01-15 MED ORDER — IOPAMIDOL (ISOVUE-300) INJECTION 61%
75.0000 mL | Freq: Once | INTRAVENOUS | Status: AC | PRN
  Administered 2021-01-15: 75 mL via INTRAVENOUS

## 2021-01-25 ENCOUNTER — Other Ambulatory Visit: Payer: Self-pay | Admitting: Internal Medicine

## 2021-01-30 ENCOUNTER — Ambulatory Visit
Admission: RE | Admit: 2021-01-30 | Discharge: 2021-01-30 | Disposition: A | Discharge status: Home / Self Care | Payer: Medicare PPO | Source: Ambulatory Visit | Attending: Internal Medicine | Admitting: Internal Medicine

## 2021-01-30 ENCOUNTER — Other Ambulatory Visit: Payer: Self-pay

## 2021-01-30 DIAGNOSIS — Z780 Asymptomatic menopausal state: Secondary | ICD-10-CM | POA: Diagnosis not present

## 2021-01-30 DIAGNOSIS — E2839 Other primary ovarian failure: Secondary | ICD-10-CM

## 2021-01-30 DIAGNOSIS — M810 Age-related osteoporosis without current pathological fracture: Secondary | ICD-10-CM | POA: Diagnosis not present

## 2021-02-01 ENCOUNTER — Other Ambulatory Visit: Payer: Self-pay

## 2021-02-01 ENCOUNTER — Other Ambulatory Visit: Payer: Self-pay | Admitting: Internal Medicine

## 2021-02-01 DIAGNOSIS — M810 Age-related osteoporosis without current pathological fracture: Secondary | ICD-10-CM

## 2021-02-09 DIAGNOSIS — H40033 Anatomical narrow angle, bilateral: Secondary | ICD-10-CM | POA: Diagnosis not present

## 2021-02-09 DIAGNOSIS — E119 Type 2 diabetes mellitus without complications: Secondary | ICD-10-CM | POA: Diagnosis not present

## 2021-03-03 ENCOUNTER — Other Ambulatory Visit: Payer: Self-pay | Admitting: Internal Medicine

## 2021-03-07 ENCOUNTER — Other Ambulatory Visit: Payer: Self-pay | Admitting: Internal Medicine

## 2021-03-14 ENCOUNTER — Other Ambulatory Visit: Payer: Self-pay | Admitting: Internal Medicine

## 2021-04-03 ENCOUNTER — Encounter: Payer: Self-pay | Admitting: Internal Medicine

## 2021-04-15 ENCOUNTER — Ambulatory Visit: Payer: Medicare PPO | Admitting: Internal Medicine

## 2021-04-23 ENCOUNTER — Other Ambulatory Visit: Payer: Self-pay | Admitting: Nephrology

## 2021-04-23 DIAGNOSIS — N281 Cyst of kidney, acquired: Secondary | ICD-10-CM

## 2021-04-25 ENCOUNTER — Encounter: Payer: Self-pay | Admitting: Nurse Practitioner

## 2021-04-25 ENCOUNTER — Other Ambulatory Visit: Payer: Self-pay

## 2021-04-25 ENCOUNTER — Ambulatory Visit (INDEPENDENT_AMBULATORY_CARE_PROVIDER_SITE_OTHER): Payer: Medicare PPO | Admitting: Nurse Practitioner

## 2021-04-25 VITALS — BP 130/88 | HR 72 | Temp 98.1°F

## 2021-04-25 DIAGNOSIS — R0981 Nasal congestion: Secondary | ICD-10-CM | POA: Diagnosis not present

## 2021-04-25 DIAGNOSIS — R051 Acute cough: Secondary | ICD-10-CM | POA: Diagnosis not present

## 2021-04-25 LAB — POC INFLUENZA A&B (BINAX/QUICKVUE)
Influenza A, POC: NEGATIVE
Influenza B, POC: NEGATIVE

## 2021-04-25 MED ORDER — AMOXICILLIN-POT CLAVULANATE 875-125 MG PO TABS: 1.0000 | ORAL_TABLET | Freq: Two times a day (BID) | ORAL | 0 refills | Status: AC

## 2021-04-25 NOTE — Progress Notes (Signed)
I,Amarie Tarte T Katisha Shimizu,acting as a Education administrator for [employer], NP.,have documented all relevant documentation on the behalf of [employer], NP,as directed by  Bary Castilla, NP while in the presence of Bary Castilla, NP.  This visit occurred during the SARS-CoV-2 public health emergency.  Safety protocols were in place, including screening questions prior to the visit, additional usage of staff PPE, and extensive cleaning of exam room while observing appropriate contact time as indicated for disinfecting solutions.  Subjective:     Patient ID: Shannon Mcconnell , female    DOB: 1932-04-05 , 87 y.o.   MRN: [id number]   Chief Complaint  Patient presents with   Nasal Congestion    HPI  Pt presents today with nasal congestion, acute cough.     Past Medical History:  Diagnosis Date   DM (diabetes mellitus) ([place])    High cholesterol    Hypertension      Family History  Problem Relation Age of Onset   Hypertension Mother    Hypertension Father      Current Outpatient Medications:    amoxicillin-clavulanate (AUGMENTIN) 875-125 MG tablet, Take 1 tablet by mouth 2 (two) times daily for 7 days., Disp: 14 tablet, Rfl: 0   amLODipine (NORVASC) 10 MG tablet, Take 1 tablet (10 mg total) by mouth daily., Disp: 90 tablet, Rfl: 2   Blood Glucose Monitoring Suppl (TRUE METRIX METER) w/Device KIT, USE AS DIRECTED TO CHECK BLOOD SUGAR TWICE DAILY DX: E11.65, Disp: 1 kit, Rfl: 1   Cholecalciferol (VITAMIN D3) 50 MCG (2000 UT) capsule, TAKE 1 CAPSULE BY MOUTH TWICE DAILY **DISCONTINUE  VITAMIN  D2  50,000  IU**, Disp: 60 capsule, Rfl: 0   hydrochlorothiazide (MICROZIDE) 12.5 MG capsule, Take 1 capsule by mouth once daily, Disp: 90 capsule, Rfl: 1   olmesartan (BENICAR) 40 MG tablet, Take 1 tablet (40 mg total) by mouth daily., Disp: 90 tablet, Rfl: 2   pravastatin (PRAVACHOL) 20 MG tablet, Take 1 tablet by mouth once daily, Disp: 90 tablet, Rfl: 0   RELION TRUE METRIX TEST STRIPS test  strip, USE AS DIRECTED TWICE DAILY, Disp: 50 each, Rfl: 0   No Known Allergies   Review of Systems  Constitutional: Negative.   Respiratory: Negative.    Cardiovascular: Negative.   Neurological: Negative.   Psychiatric/Behavioral: Negative.      Today's Vitals   04/25/21 1146  BP: 130/88  Pulse: 72  Temp: 98.1 F (36.7 C)   There is no height or weight on file to calculate BMI.   Objective:  Physical Exam      Assessment And Plan:     1. Sinus congestion - amoxicillin-clavulanate (AUGMENTIN) 875-125 MG tablet; Take 1 tablet by mouth 2 (two) times daily for 7 days.  Dispense: 14 tablet; Refill: 0 - Novel Coronavirus, NAA ([facility]) - POC Influenza A&B(BINAX/QUICKVUE)  2. Nasal congestion - amoxicillin-clavulanate (AUGMENTIN) 875-125 MG tablet; Take 1 tablet by mouth 2 (two) times daily for 7 days.  Dispense: 14 tablet; Refill: 0 - Novel Coronavirus, NAA ([facility]) - POC Influenza A&B(BINAX/QUICKVUE)  3. Acute cough - Novel Coronavirus, NAA ([facility]) - POC Influenza A&B(BINAX/QUICKVUE)     Patient was given opportunity to ask questions. Patient verbalized understanding of the plan and was able to repeat key elements of the plan. All questions were answered to their satisfaction.  Debbora Dus, CMA   I, Debbora Dus, CMA, have reviewed all documentation for this visit. The documentation on 04/25/21 for the exam, diagnosis, procedures, and orders  are all accurate and complete.   IF YOU HAVE BEEN REFERRED TO A SPECIALIST, IT MAY TAKE 1-2 WEEKS TO SCHEDULE/PROCESS THE REFERRAL. IF YOU HAVE NOT HEARD FROM US/SPECIALIST IN TWO WEEKS, PLEASE GIVE [place] A CALL AT [phone] X [phone].   THE PATIENT IS ENCOURAGED TO PRACTICE SOCIAL DISTANCING DUE TO THE COVID-19 PANDEMIC.

## 2021-04-25 NOTE — Patient Instructions (Signed)
Postnasal Drip ?Postnasal drip is the feeling of mucus going down the back of your throat. Mucus is a slimy substance that moistens and cleans your nose and throat, as well as the air pockets in face bones near your forehead and cheeks (sinuses). Small amounts of mucus pass from your nose and sinuses down the back of your throat all the time. This is normal. When you produce too much mucus or the mucus gets too thick, you can feel it. ?Some common causes of postnasal drip include: ?Having more mucus because of: ?A cold or the flu. ?Allergies. ?Cold air. ?Certain medicines. ?Having more mucus that is thicker because of: ?A sinus or nasal infection. ?Dry air. ?A food allergy. ?Follow these instructions at home: ?Relieving discomfort ? ?Gargle with a salt-water mixture 3-4 times a day or as needed. To make a salt-water mixture, completely dissolve ?-1 tsp of salt in 1 cup of warm water. ?If the air in your home is dry, use a humidifier to add moisture to the air. ?Use a saline spray or container (neti pot) to flush out the nose (nasal irrigation). These methods can help clear away mucus and keep the nasal passages moist. ?General instructions ?Take over-the-counter and prescription medicines only as told by your health care provider. ?Follow instructions from your health care provider about eating or drinking restrictions. You may need to avoid caffeine. ?Avoid things that you know you are allergic to (allergens), like dust, mold, pollen, pets, or certain foods. ?Drink enough fluid to keep your urine pale yellow. ?Keep all follow-up visits as told by your health care provider. This is important. ?Contact a health care provider if: ?You have a fever. ?You have a sore throat. ?You have difficulty swallowing. ?You have headache. ?You have sinus pain. ?You have a cough that does not go away. ?The mucus from your nose becomes thick and is green or yellow in color. ?You have cold or flu symptoms that last more than 10  days. ?Summary ?Postnasal drip is the feeling of mucus going down the back of your throat. ?If your health care provider approves, use nasal irrigation or a nasal spray 2?4 times a day. ?Avoid things that you know you are allergic to (allergens), like dust, mold, pollen, pets, or certain foods. ?This information is not intended to replace advice given to you by your health care provider. Make sure you discuss any questions you have with your health care provider. ?Document Revised: 03/13/2020 Document Reviewed: 03/13/2020 ?Elsevier Patient Education ? 2022 Elsevier Inc. ? ?

## 2021-04-25 NOTE — Progress Notes (Signed)
This visit occurred during the SARS-CoV-2 public health emergency.  Safety protocols were in place, including screening questions prior to the visit, additional usage of staff PPE, and extensive cleaning of exam room while observing appropriate contact time as indicated for disinfecting solutions.  Subjective:     Patient ID: Shannon Mcconnell , female    DOB: 13-Aug-1931 , 87 y.o.   MRN: [id number]   Chief Complaint  Patient presents with   Nasal Congestion    HPI  She is here with congestion, cough. No fever. No headache.  She is vaccinated with the flu vaccine and the COVID vaccine. No other complaints. She is coughing up phelgn.     Past Medical History:  Diagnosis Date   DM (diabetes mellitus) ([place])    High cholesterol    Hypertension      Family History  Problem Relation Age of Onset   Hypertension Mother    Hypertension Father      Current Outpatient Medications:    amoxicillin-clavulanate (AUGMENTIN) 875-125 MG tablet, Take 1 tablet by mouth 2 (two) times daily for 7 days., Disp: 14 tablet, Rfl: 0   amLODipine (NORVASC) 10 MG tablet, Take 1 tablet (10 mg total) by mouth daily., Disp: 90 tablet, Rfl: 2   Blood Glucose Monitoring Suppl (TRUE METRIX METER) w/Device KIT, USE AS DIRECTED TO CHECK BLOOD SUGAR TWICE DAILY DX: E11.65, Disp: 1 kit, Rfl: 1   Cholecalciferol (VITAMIN D3) 50 MCG (2000 UT) capsule, TAKE 1 CAPSULE BY MOUTH TWICE DAILY **DISCONTINUE  VITAMIN  D2  50,000  IU**, Disp: 60 capsule, Rfl: 0   hydrochlorothiazide (MICROZIDE) 12.5 MG capsule, Take 1 capsule by mouth once daily, Disp: 90 capsule, Rfl: 1   olmesartan (BENICAR) 40 MG tablet, Take 1 tablet (40 mg total) by mouth daily., Disp: 90 tablet, Rfl: 2   pravastatin (PRAVACHOL) 20 MG tablet, Take 1 tablet by mouth once daily, Disp: 90 tablet, Rfl: 0   RELION TRUE METRIX TEST STRIPS test strip, USE AS DIRECTED TWICE DAILY, Disp: 50 each, Rfl: 0   No Known Allergies   Review of Systems  Constitutional:   Negative for chills, fatigue and fever.  HENT:  Positive for congestion and rhinorrhea. Negative for sinus pressure and sore throat.   Respiratory:  Positive for cough. Negative for wheezing.   Cardiovascular:  Negative for chest pain and palpitations.  Neurological:  Negative for weakness and headaches.    There were no vitals filed for this visit. There is no height or weight on file to calculate BMI.   Objective:  Physical Exam HENT:     Head: Normocephalic and atraumatic.     Nose: Congestion present.  Cardiovascular:     Rate and Rhythm: Normal rate and regular rhythm.     Pulses: Normal pulses.     Heart sounds: Normal heart sounds. No murmur heard. Pulmonary:     Effort: Pulmonary effort is normal. No respiratory distress.     Breath sounds: Normal breath sounds. No wheezing.  Skin:    General: Skin is warm and dry.     Capillary Refill: Capillary refill takes less than 2 seconds.  Neurological:     Mental Status: She is alert.        Assessment And Plan:     1. Sinus congestion - amoxicillin-clavulanate (AUGMENTIN) 875-125 MG tablet; Take 1 tablet by mouth 2 (two) times daily for 7 days.  Dispense: 14 tablet; Refill: 0 - Novel Coronavirus, NAA ([facility]) - POC Influenza A&B(BINAX/QUICKVUE)  2. Nasal congestion - amoxicillin-clavulanate (AUGMENTIN) 875-125 MG tablet; Take 1 tablet by mouth 2 (two) times daily for 7 days.  Dispense: 14 tablet; Refill: 0 - Novel Coronavirus, NAA ([facility]) - POC Influenza A&B(BINAX/QUICKVUE)  3. Acute cough - Novel Coronavirus, NAA ([facility]) - POC Influenza A&B(BINAX/QUICKVUE)   The patient was encouraged to call or send a message through [place] for any questions or concerns.   Follow up: if symptoms persist or do not get better.   Side effects and appropriate use of all the medication(s) were discussed with the patient today. Patient advised to use the medication(s) as directed by their healthcare provider. The patient was  encouraged to read, review, and understand all associated package inserts and contact our office with any questions or concerns. The patient accepts the risks of the treatment plan and had an opportunity to ask questions.   Advised patient to take Vitamin C, D, Zinc.  Keep yourself hydrated with a lot of water and rest. Take Delsym for cough and Mucinex as need. Take Tylenol or pain reliever every 4-6 hours as needed for pain/fever/body ache. If you have elevated blood pressure, you can take OTC Corcidin as needed.  Advised patient if symptoms get worse or if she experiences any SOB, chest pain or pain in her legs to seek immediate emergency care. Continue to monitor your oxygen levels. Call us if you have any questions.   Patient was given opportunity to ask questions. Patient verbalized understanding of the plan and was able to repeat key elements of the plan. All questions were answered to their satisfaction.  Raman Evonte Prestage, DNP   I, Raman Yuvonne Lanahan have reviewed all documentation for this visit. The documentation on 04/25/21 for the exam, diagnosis, procedures, and orders are all accurate and complete.   THE PATIENT IS ENCOURAGED TO PRACTICE SOCIAL DISTANCING DUE TO THE COVID-19 PANDEMIC.

## 2021-04-26 LAB — SARS-COV-2, NAA 2 DAY TAT

## 2021-04-26 LAB — NOVEL CORONAVIRUS, NAA: SARS-CoV-2, NAA: NOT DETECTED

## 2021-05-04 ENCOUNTER — Other Ambulatory Visit: Payer: Self-pay | Admitting: Internal Medicine

## 2021-05-16 ENCOUNTER — Ambulatory Visit
Admission: RE | Admit: 2021-05-16 | Discharge: 2021-05-16 | Disposition: A | Discharge status: Home / Self Care | Payer: Medicare PPO | Source: Ambulatory Visit | Attending: Nephrology | Admitting: Nephrology

## 2021-05-16 DIAGNOSIS — K429 Umbilical hernia without obstruction or gangrene: Secondary | ICD-10-CM | POA: Diagnosis not present

## 2021-05-16 DIAGNOSIS — M4319 Spondylolisthesis, multiple sites in spine: Secondary | ICD-10-CM | POA: Diagnosis not present

## 2021-05-16 DIAGNOSIS — K439 Ventral hernia without obstruction or gangrene: Secondary | ICD-10-CM | POA: Diagnosis not present

## 2021-05-16 DIAGNOSIS — N281 Cyst of kidney, acquired: Secondary | ICD-10-CM | POA: Diagnosis not present

## 2021-05-16 MED ORDER — IOPAMIDOL (ISOVUE-300) INJECTION 61%
100.0000 mL | Freq: Once | INTRAVENOUS | Status: AC | PRN
  Administered 2021-05-16: 100 mL via INTRAVENOUS

## 2021-06-02 ENCOUNTER — Other Ambulatory Visit: Payer: Self-pay | Admitting: Internal Medicine

## 2021-06-04 ENCOUNTER — Other Ambulatory Visit: Payer: Self-pay | Admitting: Internal Medicine

## 2021-06-19 ENCOUNTER — Other Ambulatory Visit: Payer: Self-pay | Admitting: Internal Medicine

## 2021-08-07 ENCOUNTER — Other Ambulatory Visit: Payer: Self-pay | Admitting: Internal Medicine

## 2021-08-12 ENCOUNTER — Other Ambulatory Visit: Payer: Self-pay | Admitting: Internal Medicine

## 2021-08-17 IMAGING — US US RENAL
1 series · 14 of 25 positions shown · non-contrast
Comparison: None.

CLINICAL DATA: Stage III CKD.

EXAM:
RENAL / URINARY TRACT ULTRASOUND COMPLETE

[Series 1: us renal · 0.22mm/px · 39 acquisitions, 14 frames shown]
[im 1/39]
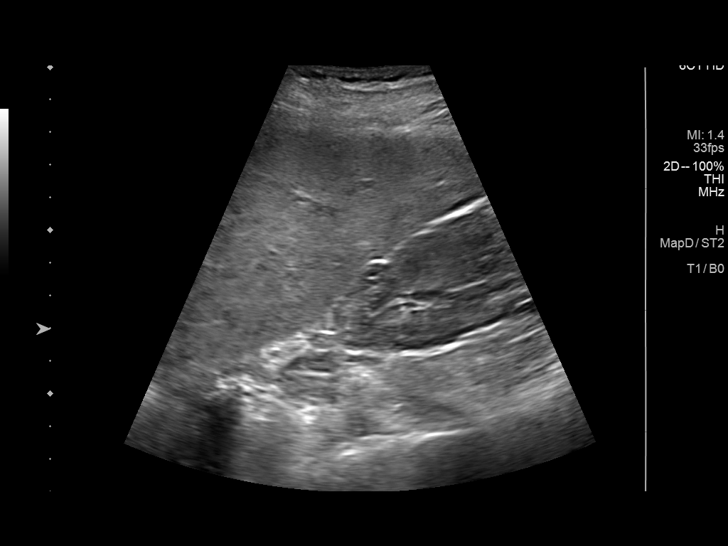
[im 4/39]
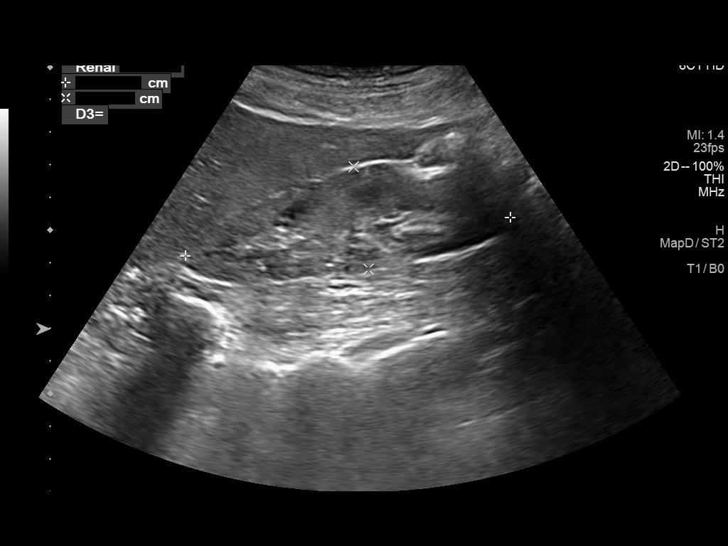
[im 7/39]
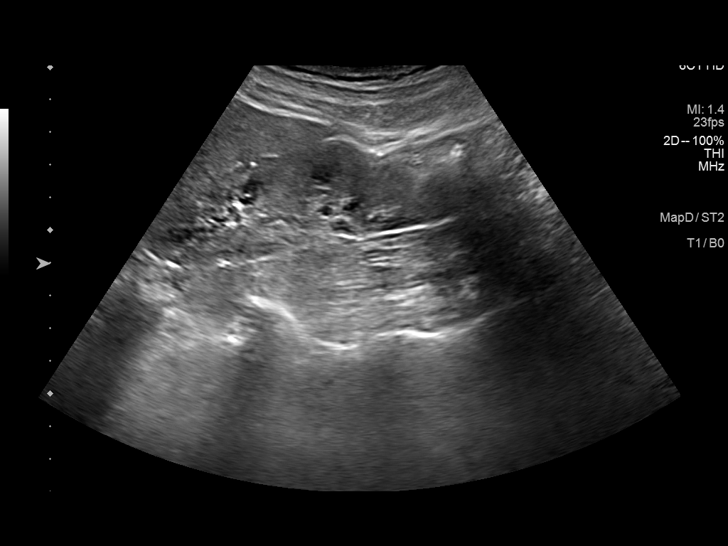
[im 10/39]
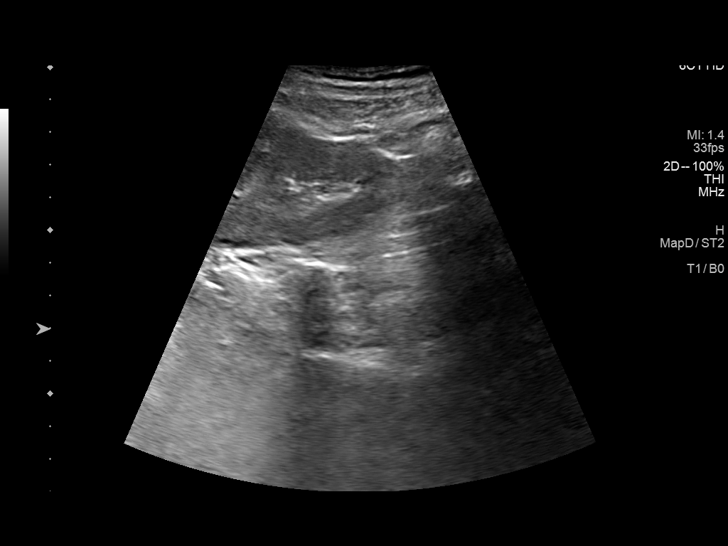
[im 13/39]
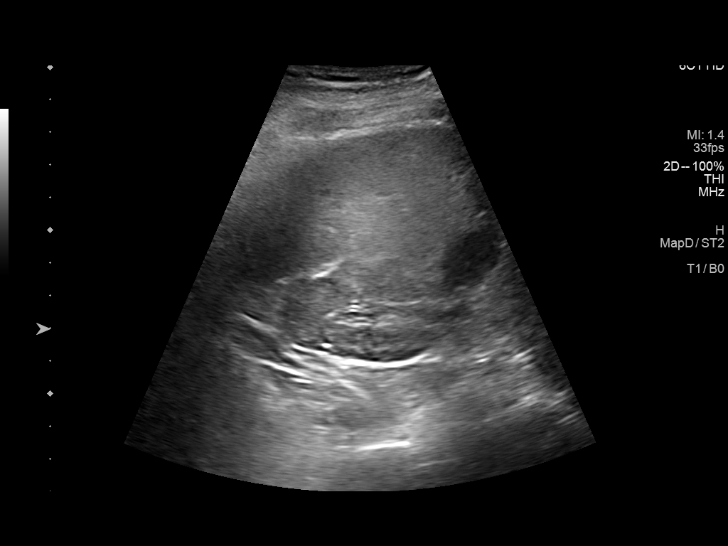
[im 15/39]
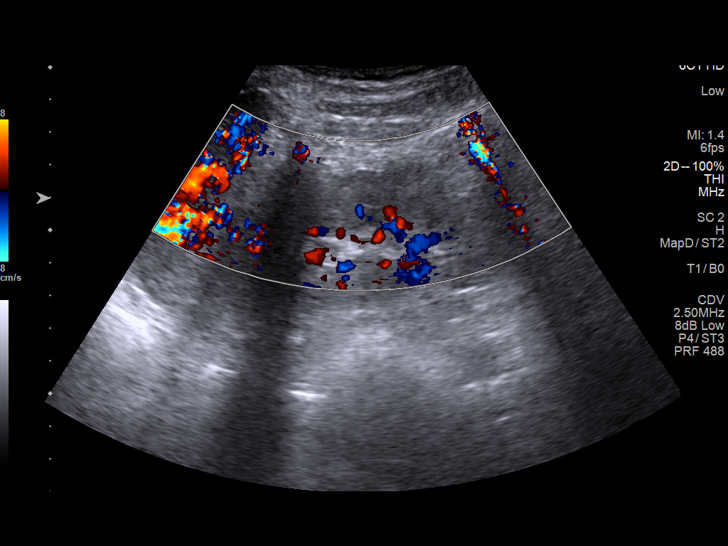
[im 18/39]
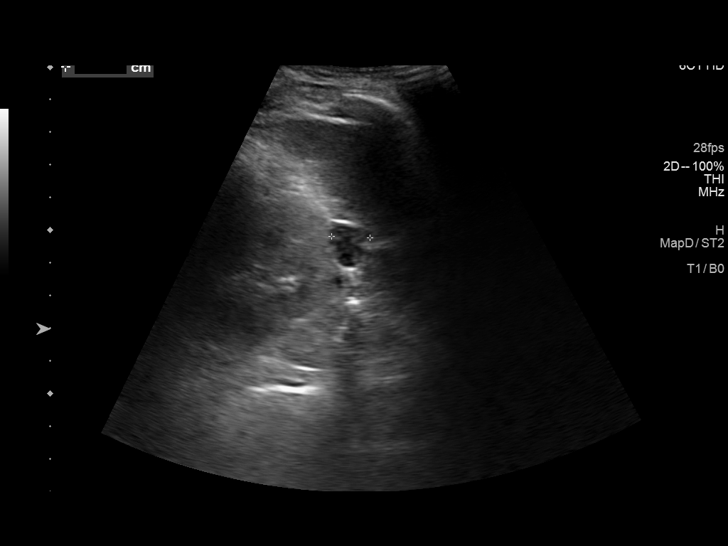
[im 21/39]
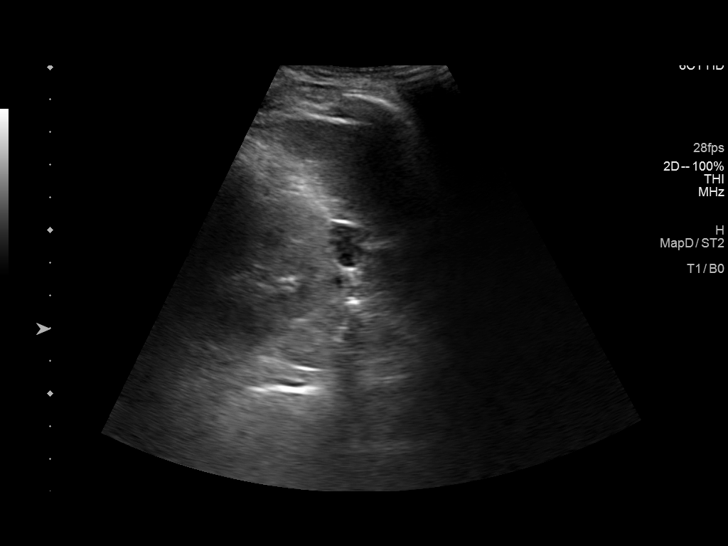
[im 24/39]
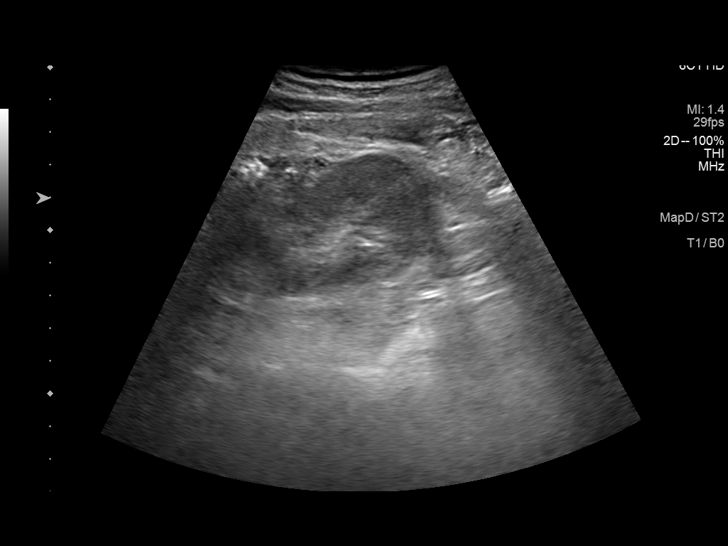
[im 26/39]
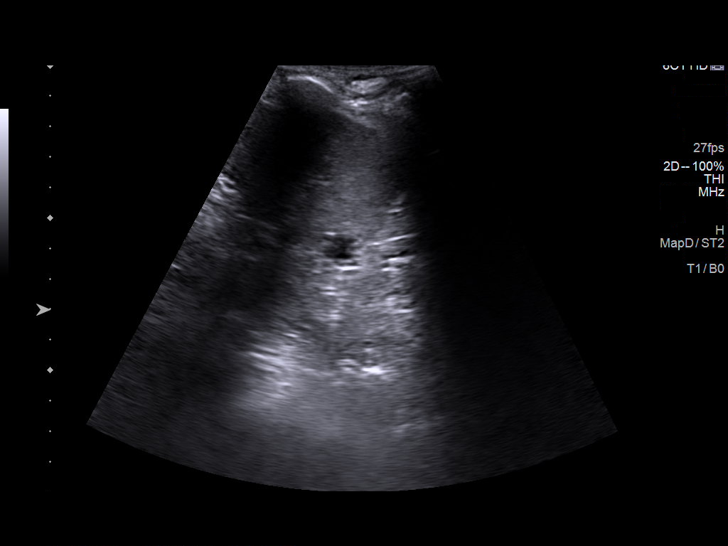
[im 29/39]
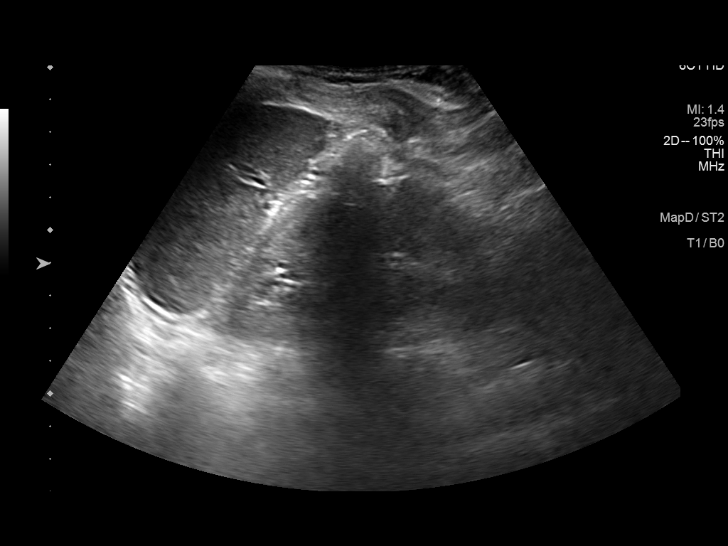
[im 32/39]
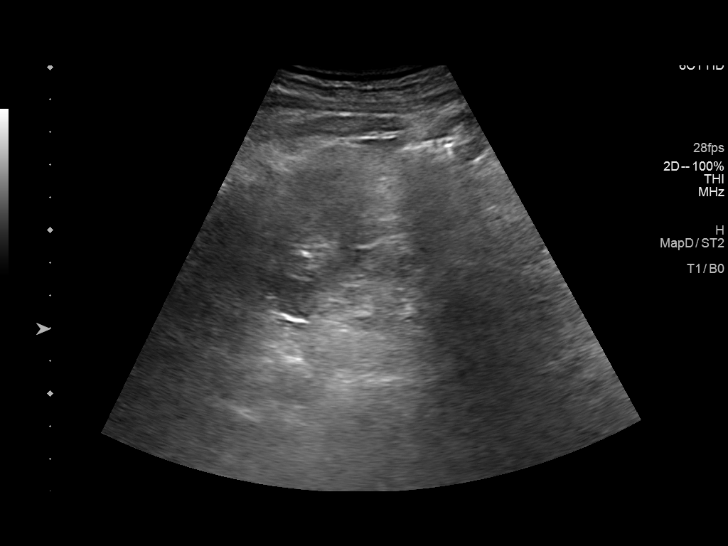
[im 35/39]
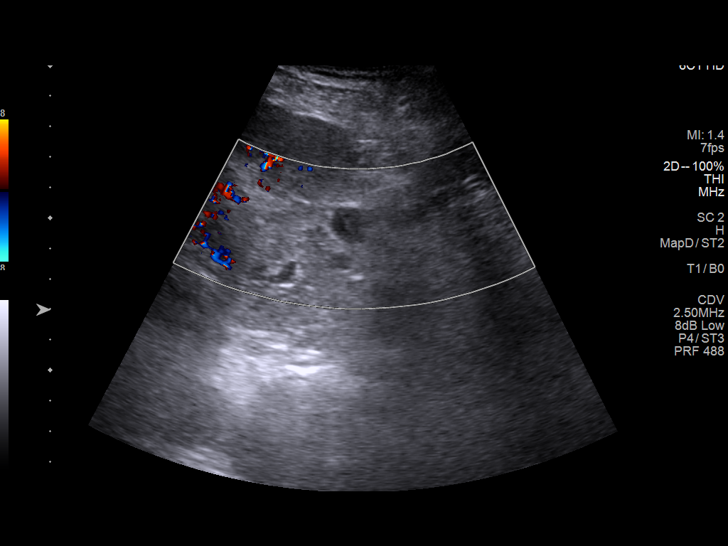
[im 39/39]
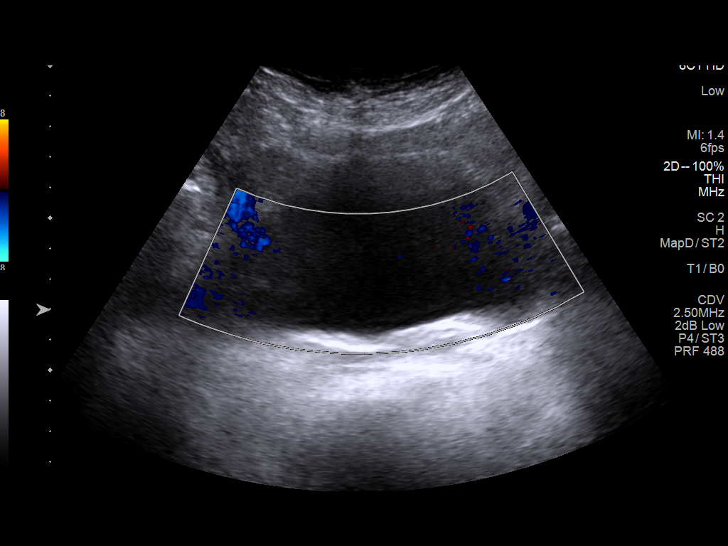

[14 of 25 positions shown; findings below may reference images not displayed]

FINDINGS: Right Kidney:

Renal measurements: 10 x 3.8 x 3.2 cm = volume: 64 mL. Mildly
increased echogenicity. No mass or hydronephrosis visualized.

Left Kidney:

Renal measurements: 9.3 x 5.2 x 3.9 cm = volume: 97 mL. Mildly
increased echogenicity. Complex 1.3 cm interpolar renal cyst. No
hydronephrosis visualized.

Bladder:

Appears normal for degree of bladder distention.

Other:

None.
IMPRESSION: 1. Mildly increased echogenicity of the bilateral kidneys compatible
with medical renal disease. No hydronephrosis.
2. Complex 1.3 cm interpolar left renal cyst. Consider further
evaluation with renal protocol CT or MRI with and without contrast.

## 2021-08-27 ENCOUNTER — Other Ambulatory Visit: Payer: Self-pay

## 2021-08-27 MED ORDER — RELION TRUE METRIX TEST STRIPS VI STRP: ORAL_STRIP | 11 refills | Status: DC

## 2021-09-04 ENCOUNTER — Ambulatory Visit (INDEPENDENT_AMBULATORY_CARE_PROVIDER_SITE_OTHER): Payer: Medicare PPO

## 2021-09-04 ENCOUNTER — Other Ambulatory Visit: Payer: Self-pay

## 2021-09-04 VITALS — BP 118/80 | HR 105 | Temp 98.5°F | Ht 64.8 in | Wt 149.0 lb

## 2021-09-04 DIAGNOSIS — Z23 Encounter for immunization: Secondary | ICD-10-CM

## 2021-09-04 DIAGNOSIS — Z0000 Encounter for general adult medical examination without abnormal findings: Secondary | ICD-10-CM | POA: Diagnosis not present

## 2021-09-04 NOTE — Progress Notes (Signed)
?This visit occurred during the SARS-CoV-2 public health emergency.  Safety protocols were in place, including screening questions prior to the visit, additional usage of staff PPE, and extensive cleaning of exam room while observing appropriate contact time as indicated for disinfecting solutions. ? ?Subjective:  ? Shannon Mcconnell is a 88 y.o. female who presents for Medicare Annual (Subsequent) preventive examination. ? ?Review of Systems    ? ?Cardiac Risk Factors include: advanced age (>16mn, >>95women);diabetes mellitus;hypertension ? ?   ?Objective:  ?  ?Today's Vitals  ? 09/04/21 1524 09/04/21 1529 09/04/21 1555  ?BP: 140/80  118/80  ?Pulse: (!) 105    ?Temp: 98.5 ?F (36.9 ?C)    ?TempSrc: Oral    ?SpO2: 98%    ?Weight: 149 lb (67.6 kg)    ?Height: 5' 4.8" (1.646 m)    ?PainSc:  5    ? ?Body mass index is 24.95 kg/m?. ? ? ?  09/04/2021  ?  3:34 PM 08/29/2020  ?  3:40 PM 07/07/2019  ? 11:27 AM 05/26/2018  ? 12:45 PM  ?Advanced Directives  ?Does Patient Have a Medical Advance Directive? No No No No  ?Would patient like information on creating a medical advance directive? Yes (MAU/Ambulatory/Procedural Areas - Information given)  Yes (MAU/Ambulatory/Procedural Areas - Information given)   ? ? ?Current Medications (verified) ?Outpatient Encounter Medications as of 09/04/2021  ?Medication Sig  ? amLODipine (NORVASC) 10 MG tablet Take 1 tablet (10 mg total) by mouth daily.  ? Blood Glucose Monitoring Suppl (TRUE METRIX METER) w/Device KIT USE AS DIRECTED TO CHECK BLOOD SUGAR TWICE DAILY DX: E11.65  ? Cholecalciferol (VITAMIN D3) 50 MCG (2000 UT) capsule TAKE 1 CAPSULE BY MOUTH TWICE DAILY **DISCONTINUE  VITAMIN  D2  50,000  IU**  ? glucose blood (RELION TRUE METRIX TEST STRIPS) test strip USE AS DIRECTED TWICE DAILY dx: e11.9  ? hydrochlorothiazide (MICROZIDE) 12.5 MG capsule Take 1 capsule by mouth once daily  ? olmesartan (BENICAR) 40 MG tablet Take 1 tablet (40 mg total) by mouth daily.  ? pravastatin (PRAVACHOL)  20 MG tablet Take 1 tablet by mouth once daily  ? ?No facility-administered encounter medications on file as of 09/04/2021.  ? ? ?Allergies (verified) ?Patient has no known allergies.  ? ?History: ?Past Medical History:  ?Diagnosis Date  ? [place]   ? High cholesterol   ? Hypertension   ? ?Past Surgical History:  ?Procedure Laterality Date  ? ABDOMINAL HYSTERECTOMY    ? mastectomy Left   ? ?Family History  ?Problem Relation Age of Onset  ? Hypertension Mother   ? Hypertension Father   ? ?Social History  ? ?Socioeconomic History  ? Marital status: Divorced  ?  Spouse name: Not on file  ? Number of children: Not on file  ? Years of education: Not on file  ? Highest education level: Not on file  ?Occupational History  ? Occupation: retired  ?Tobacco Use  ? Smoking status: Never  ? Smokeless tobacco: Never  ?Vaping Use  ? Vaping Use: Never used  ?Substance and Sexual Activity  ? Alcohol use: Never  ? Drug use: Never  ? Sexual activity: Not Currently  ?Other Topics Concern  ? Not on file  ?Social History Narrative  ? Not on file  ? ?Social Determinants of Health  ? ?Financial Resource Strain: Low Risk   ? Difficulty of Paying Living Expenses: Not hard at all  ?Food Insecurity: No Food Insecurity  ? Worried About Running  Out of Food in the Last Year: Never true  ? Ran Out of Food in the Last Year: Never true  ?Transportation Needs: No Transportation Needs  ? Lack of Transportation (Medical): No  ? Lack of Transportation (Non-Medical): No  ?Physical Activity: Insufficiently Active  ? Days of Exercise per Week: 5 days  ? Minutes of Exercise per Session: 20 min  ?Stress: No Stress Concern Present  ? Feeling of Stress : Not at all  ?Social Connections: Not on file  ? ? ?Tobacco Counseling ?Counseling given: Not Answered ? ? ?Clinical Intake: ? ?Pre-visit preparation completed: Yes ? ?Pain : 0-10 ?Pain Score: 5  ?Pain Type: Chronic pain ?Pain Location: Knee ?Pain Orientation: Left ?Pain Descriptors /  Indicators: Aching ?Pain Onset: More than a month ago ?Pain Frequency: Intermittent ? ?  ? ?Nutritional Status: BMI of 19-24  Normal ?Diabetes: Yes ? ?How often do you need to have someone help you when you read instructions, pamphlets, or other written materials from your doctor or pharmacy?: 1 - Never ? ?Diabetic? Yes ?Nutrition Risk Assessment: ? ?Has the patient had any N/V/D within the last 2 months?  No  ?Does the patient have any non-healing wounds?  No  ?Has the patient had any unintentional weight loss or weight gain?  No  ? ?Diabetes: ? ?Is the patient diabetic?  Yes  ?If diabetic, was a CBG obtained today?  No  ?Did the patient bring in their glucometer from home?  No  ?How often do you monitor your CBG's? Twice daily.  ? ?Financial Strains and Diabetes Management: ? ?Are you having any financial strains with the device, your supplies or your medication? No .  ?Does the patient want to be seen by Chronic Care Management for management of their diabetes?  No  ?Would the patient like to be referred to a Nutritionist or for Diabetic Management?  No  ? ?Diabetic Exams: ? ?Diabetic Eye Exam: Overdue for diabetic eye exam. Pt has been advised about the importance in completing this exam. Patient advised to call and schedule an eye exam. ?Diabetic Foot Exam: Completed 01/09/2021 ? ? ?Interpreter Needed?: No ? ?Information entered by :: NAllen LPN ? ? ?Activities of Daily Living ? ?  09/04/2021  ?  3:36 PM  ?In your present state of health, do you have any difficulty performing the following activities:  ?Hearing? 0  ?Vision? 0  ?Difficulty concentrating or making decisions? 1  ?Walking or climbing stairs? 0  ?Dressing or bathing? 0  ?Doing errands, shopping? 0  ?Preparing Food and eating ? N  ?Using the Toilet? N  ?In the past six months, have you accidently leaked urine? N  ?Do you have problems with loss of bowel control? N  ?Managing your Medications? N  ?Managing your Finances? N  ?Housekeeping or managing  your Housekeeping? N  ? ? ?Patient Care Team: ?Glendale Chard, MD as PCP - General (Internal Medicine) ? ?Indicate any recent Medical Services you may have received from other than [facility] providers in the past year (date may be approximate). ? ?   ?Assessment:  ? This is a routine wellness examination for Shannon Mcconnell. ? ?Hearing/Vision screen ?Vision Screening - Comments:: No regular eye exams, [place] ? ?Dietary issues and exercise activities discussed: ?Current Exercise Habits: Home exercise routine, Type of exercise: walking, Time (Minutes): 20, Frequency (Times/Week): 5, Weekly Exercise (Minutes/Week): 100 ? ? Goals Addressed   ? ?  ?  ?  ?  ? This Visit's  Progress  ?  Patient Stated     ?  09/04/2021, wants to keep sugar and blood pressure under control ?  ? ?  ? ?Depression Screen ? ?  09/04/2021  ?  3:36 PM 08/29/2020  ?  3:40 PM 01/09/2020  ? 10:01 AM 07/07/2019  ? 11:29 AM 11/22/2018  ? 11:46 AM 08/26/2018  ? 10:32 AM 07/02/2018  ?  3:35 PM  ?PHQ 2/9 Scores  ?PHQ - 2 Score 0 0 0 0 0 0 0  ?PHQ- 9 Score    0     ?  ?Fall Risk ? ?  09/04/2021  ?  3:36 PM 08/29/2020  ?  3:40 PM 08/29/2020  ?  3:15 PM 07/07/2019  ? 11:28 AM 02/09/2019  ?  3:02 PM  ?Fall Risk   ?Falls in the past year? 0 0 0 0 0  ?Risk for fall due to : Medication side effect Medication side effect  Medication side effect   ?Follow up Falls evaluation completed;Education provided;Falls prevention discussed Falls evaluation completed;Education provided;Falls prevention discussed  Falls evaluation completed;Education provided;Falls prevention discussed   ? ? ?FALL RISK PREVENTION PERTAINING TO THE HOME: ? ?Any stairs in or around the home? No  ?If so, are there any without handrails?  N/a ?Home free of loose throw rugs in walkways, pet beds, electrical cords, etc? Yes  ?Adequate lighting in your home to reduce risk of falls? Yes  ? ?ASSISTIVE DEVICES UTILIZED TO PREVENT FALLS: ? ?Life alert? No  ?Use of a cane, walker or w/c? No  ?Grab bars in the  bathroom? Yes  ?Shower chair or bench in shower? Yes  ?Elevated toilet seat or a handicapped toilet? Yes  ? ?TIMED UP AND GO: ? ?Was the test performed? No .  ? ? ?Gait steady and fast without use of assistive device

## 2021-09-04 NOTE — Patient Instructions (Signed)
Ms. Thielman , ?Thank you for taking time to come for your Medicare Wellness Visit. I appreciate your ongoing commitment to your health goals. Please review the following plan we discussed and let me know if I can assist you in the future.  ? ?Screening recommendations/referrals: ?Colonoscopy: not required ?Mammogram: not required ?Bone Density: completed 01/30/2021 ?Recommended yearly ophthalmology/optometry visit for glaucoma screening and checkup ?Recommended yearly dental visit for hygiene and checkup ? ?Vaccinations: ?Influenza vaccine: completed 03/31/2021, due next flu season ?Pneumococcal vaccine: completed 02/04/2017 ?Tdap vaccine: today ?Shingles vaccine: completed   ?Covid-19:  06/11/2021, 10/02/2020, 04/07/2020, 07/26/2019, 06/27/2019 ? ?Advanced directives: Advance directive discussed with you today. I have provided a copy for you to complete at home and have notarized. Once this is complete please bring a copy in to our office so we can scan it into your chart. ? ? ?Conditions/risks identified: none ? ?Next appointment: Follow up in one year for your annual wellness visit  ? ? ?Preventive Care 18 Years and Older, Female ?Preventive care refers to lifestyle choices and visits with your health care provider that can promote health and wellness. ?What does preventive care include? ?A yearly physical exam. This is also called an annual well check. ?Dental exams once or twice a year. ?Routine eye exams. Ask your health care provider how often you should have your eyes checked. ?Personal lifestyle choices, including: ?Daily care of your teeth and gums. ?Regular physical activity. ?Eating a healthy diet. ?Avoiding tobacco and drug use. ?Limiting alcohol use. ?Practicing safe sex. ?Taking low-dose aspirin every day. ?Taking vitamin and mineral supplements as recommended by your health care provider. ?What happens during an annual well check? ?The services and screenings done by your health care provider during  your annual well check will depend on your age, overall health, lifestyle risk factors, and family history of disease. ?Counseling  ?Your health care provider may ask you questions about your: ?Alcohol use. ?Tobacco use. ?Drug use. ?Emotional well-being. ?Home and relationship well-being. ?Sexual activity. ?Eating habits. ?History of falls. ?Memory and ability to understand (cognition). ?Work and work Statistician. ?Reproductive health. ?Screening  ?You may have the following tests or measurements: ?Height, weight, and BMI. ?Blood pressure. ?Lipid and cholesterol levels. These may be checked every 5 years, or more frequently if you are over 31 years old. ?Skin check. ?Lung cancer screening. You may have this screening every year starting at age 50 if you have a 30-pack-year history of smoking and currently smoke or have quit within the past 15 years. ?Fecal occult blood test (FOBT) of the stool. You may have this test every year starting at age 48. ?Flexible sigmoidoscopy or colonoscopy. You may have a sigmoidoscopy every 5 years or a colonoscopy every 10 years starting at age 35. ?Hepatitis C blood test. ?Hepatitis B blood test. ?Sexually transmitted disease (STD) testing. ?Diabetes screening. This is done by checking your blood sugar (glucose) after you have not eaten for a while (fasting). You may have this done every 1-3 years. ?Bone density scan. This is done to screen for osteoporosis. You may have this done starting at age 94. ?Mammogram. This may be done every 1-2 years. Talk to your health care provider about how often you should have regular mammograms. ?Talk with your health care provider about your test results, treatment options, and if necessary, the need for more tests. ?Vaccines  ?Your health care provider may recommend certain vaccines, such as: ?Influenza vaccine. This is recommended every year. ?Tetanus, diphtheria, and acellular pertussis (Tdap,  Td) vaccine. You may need a Td booster every 10  years. ?Zoster vaccine. You may need this after age 4. ?Pneumococcal 13-valent conjugate (PCV13) vaccine. One dose is recommended after age 49. ?Pneumococcal polysaccharide (PPSV23) vaccine. One dose is recommended after age 7. ?Talk to your health care provider about which screenings and vaccines you need and how often you need them. ?This information is not intended to replace advice given to you by your health care provider. Make sure you discuss any questions you have with your health care provider. ?Document Released: 06/29/2015 Document Revised: 02/20/2016 Document Reviewed: 04/03/2015 ?Elsevier Interactive Patient Education ? 2017 [place]. ? ?Fall Prevention in the Home ?Falls can cause injuries. They can happen to people of all ages. There are many things you can do to make your home safe and to help prevent falls. ?What can I do on the outside of my home? ?Regularly fix the edges of walkways and driveways and fix any cracks. ?Remove anything that might make you trip as you walk through a door, such as a raised step or threshold. ?Trim any bushes or trees on the path to your home. ?Use bright outdoor lighting. ?Clear any walking paths of anything that might make someone trip, such as rocks or tools. ?Regularly check to see if handrails are loose or broken. Make sure that both sides of any steps have handrails. ?Any raised decks and porches should have guardrails on the edges. ?Have any leaves, snow, or ice cleared regularly. ?Use sand or salt on walking paths during winter. ?Clean up any spills in your garage right away. This includes oil or grease spills. ?What can I do in the bathroom? ?Use night lights. ?Install grab bars by the toilet and in the tub and shower. Do not use towel bars as grab bars. ?Use non-skid mats or decals in the tub or shower. ?If you need to sit down in the shower, use a plastic, non-slip stool. ?Keep the floor dry. Clean up any water that spills on the floor as soon as it  happens. ?Remove soap buildup in the tub or shower regularly. ?Attach bath mats securely with double-sided non-slip rug tape. ?Do not have throw rugs and other things on the floor that can make you trip. ?What can I do in the bedroom? ?Use night lights. ?Make sure that you have a light by your bed that is easy to reach. ?Do not use any sheets or blankets that are too big for your bed. They should not hang down onto the floor. ?Have a firm chair that has side arms. You can use this for support while you get dressed. ?Do not have throw rugs and other things on the floor that can make you trip. ?What can I do in the kitchen? ?Clean up any spills right away. ?Avoid walking on wet floors. ?Keep items that you use a lot in easy-to-reach places. ?If you need to reach something above you, use a strong step stool that has a grab bar. ?Keep electrical cords out of the way. ?Do not use floor polish or wax that makes floors slippery. If you must use wax, use non-skid floor wax. ?Do not have throw rugs and other things on the floor that can make you trip. ?What can I do with my stairs? ?Do not leave any items on the stairs. ?Make sure that there are handrails on both sides of the stairs and use them. Fix handrails that are broken or loose. Make sure that handrails are as  long as the stairways. ?Check any carpeting to make sure that it is firmly attached to the stairs. Fix any carpet that is loose or worn. ?Avoid having throw rugs at the top or bottom of the stairs. If you do have throw rugs, attach them to the floor with carpet tape. ?Make sure that you have a light switch at the top of the stairs and the bottom of the stairs. If you do not have them, ask someone to add them for you. ?What else can I do to help prevent falls? ?Wear shoes that: ?Do not have high heels. ?Have rubber bottoms. ?Are comfortable and fit you well. ?Are closed at the toe. Do not wear sandals. ?If you use a stepladder: ?Make sure that it is fully opened.  Do not climb a closed stepladder. ?Make sure that both sides of the stepladder are locked into place. ?Ask someone to hold it for you, if possible. ?Clearly mark and make sure that you can see: ?Any grab bars

## 2021-09-25 ENCOUNTER — Other Ambulatory Visit: Payer: Self-pay | Admitting: Internal Medicine

## 2021-10-04 IMAGING — CT CT ABDOMEN WO/W CM
3 of 12 series · 14 of 46 positions shown, 16 images · IV contrast (iopamidol)
Comparison: 11/28/2020

CLINICAL DATA: Characterize complex left renal cyst identified by
prior ultrasound

EXAM:
CT ABDOMEN WITHOUT AND WITH CONTRAST
TECHNIQUE: Multidetector CT imaging of the abdomen was performed following the
standard protocol before and following the bolus administration of
intravenous contrast.
CONTRAST:  75mL [id number] IOPAMIDOL ([id number]) INJECTION 61%

[Series 6: arterial phase liver 2.00 br40 s3 cor · coronal · arterial · 0.40mm/px · 3 of 123 slices shown]
[im 31/123  soft-tissue]
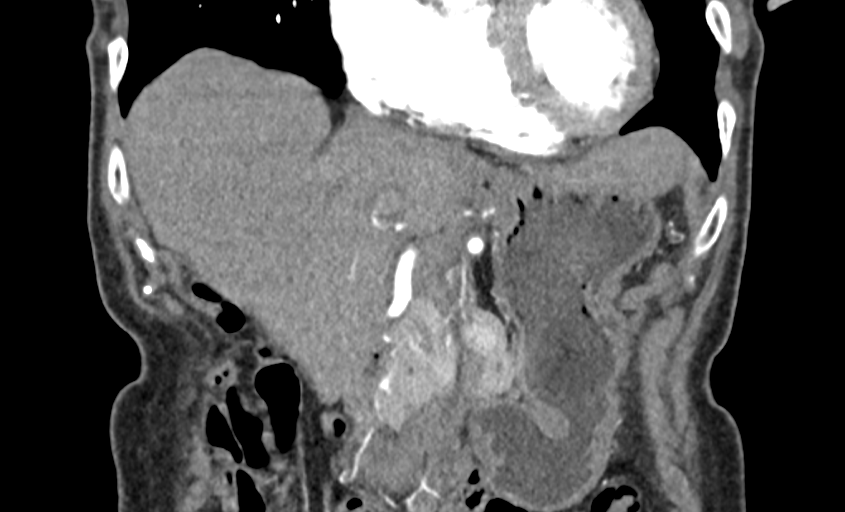
[im 62/123  soft-tissue]
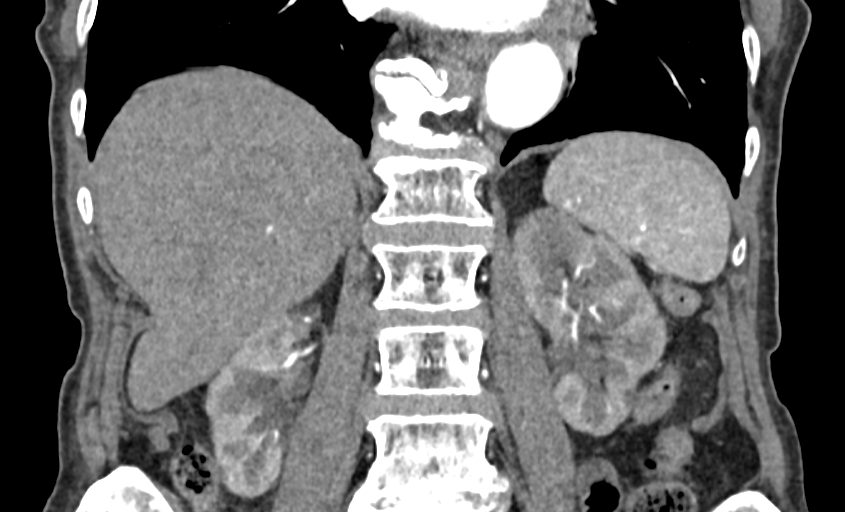
[im 92/123  soft-tissue]
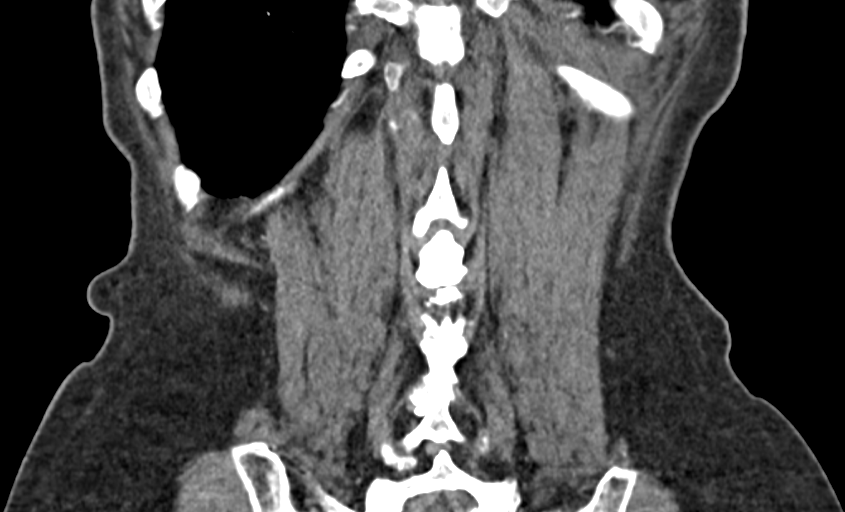

[Series 10: arterial phase liver 2.00 br40 s3 axial · axial · arterial · 0.48mm/px · 1 of 102 slices shown]
[im 21/102  soft-tissue]
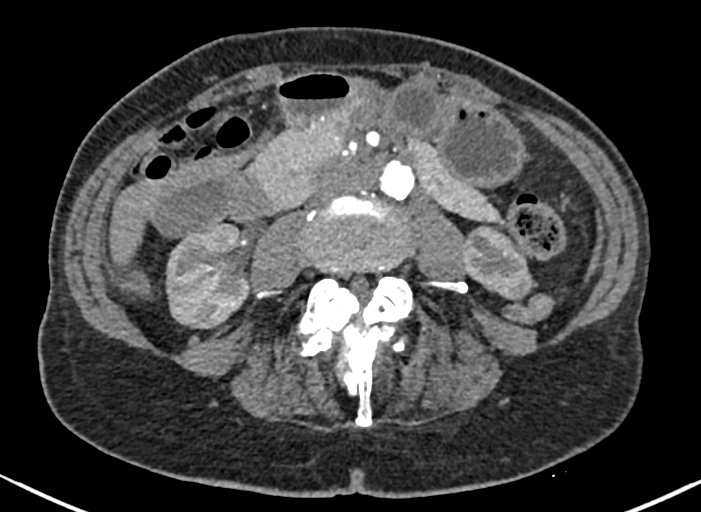

[Series 21: venous phase liver 1.00 br40 s3 axial · axial · portal-venous · 0.57mm/px · z∈[+1528,+1677]mm · 10 of 183 slices shown, 12 images]
[im 17/183  soft-tissue]
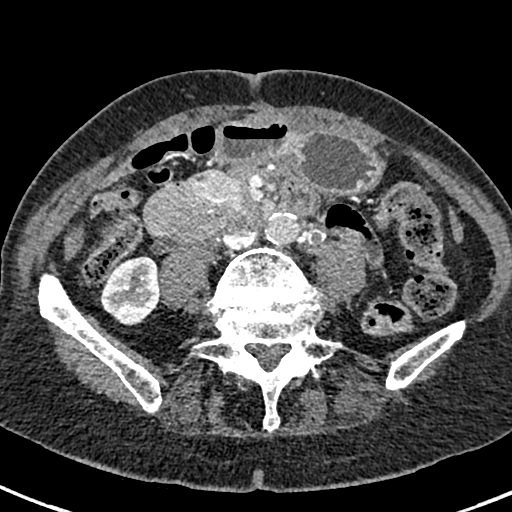
[im 17/183  bone]
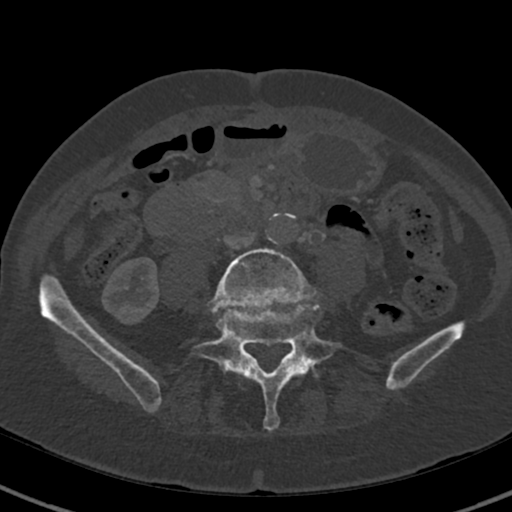
[im 34/183  soft-tissue]
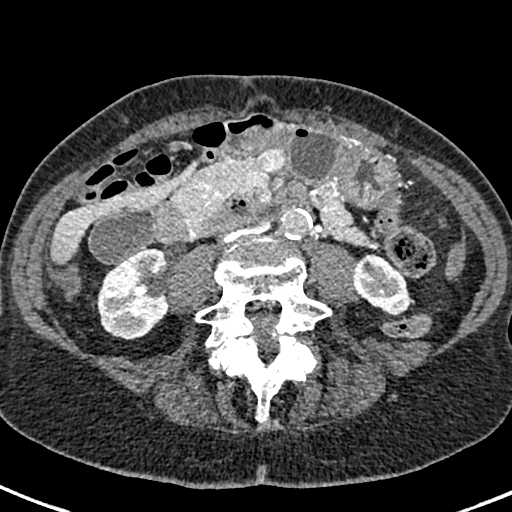
[im 50/183  soft-tissue]
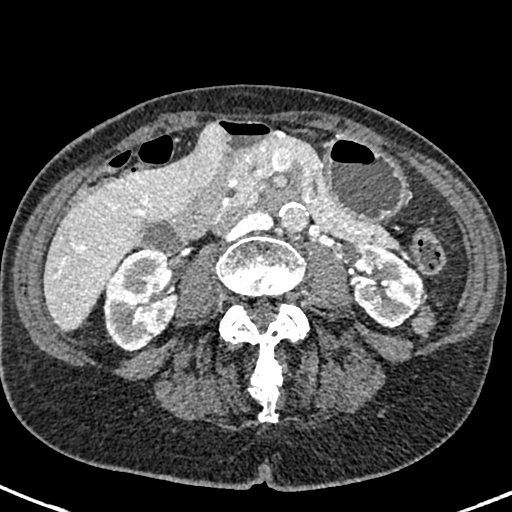
[im 67/183  soft-tissue]
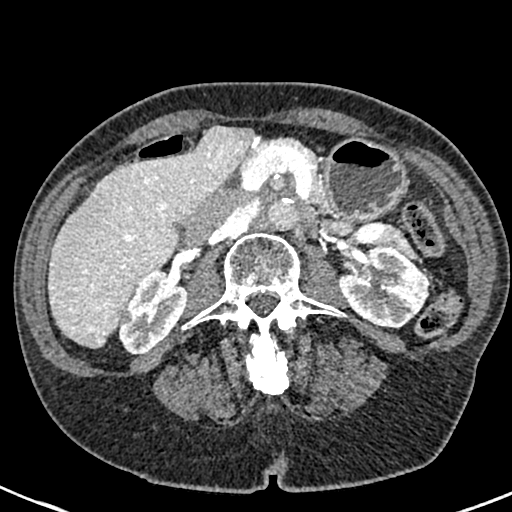
[im 83/183  soft-tissue]
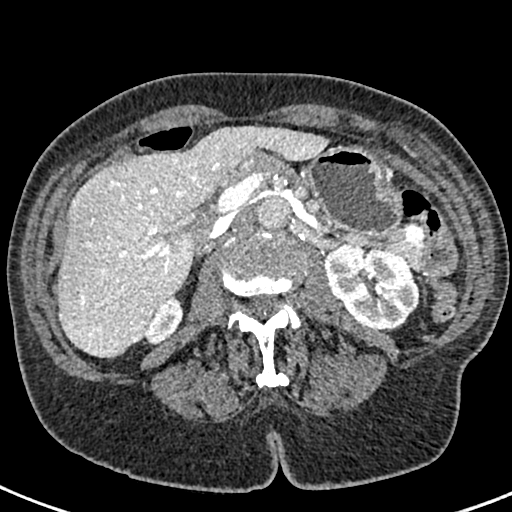
[im 100/183  soft-tissue]
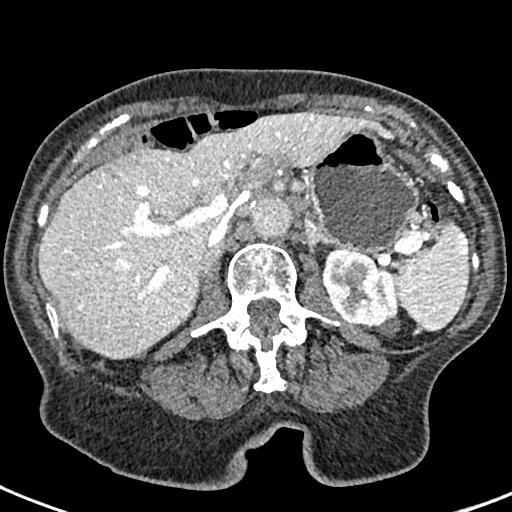
[im 116/183  soft-tissue]
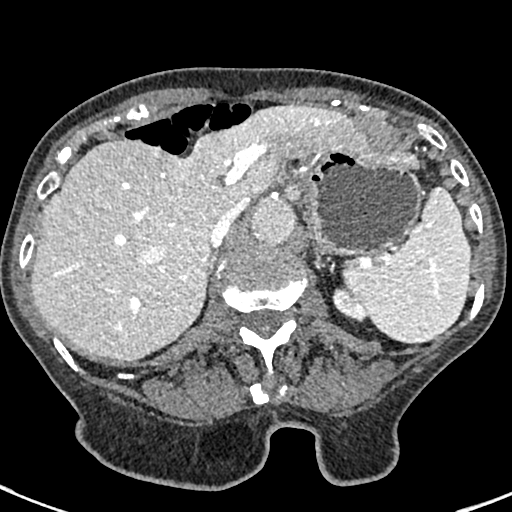
[im 133/183  soft-tissue]
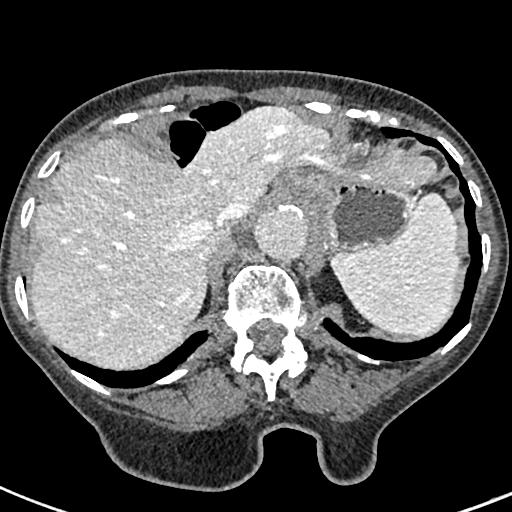
[im 149/183  soft-tissue]
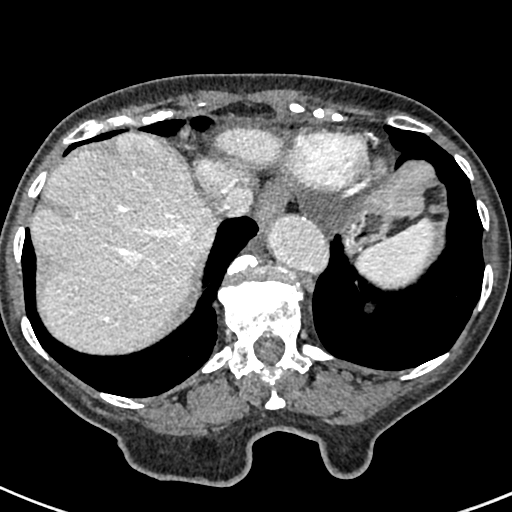
[im 149/183  bone]
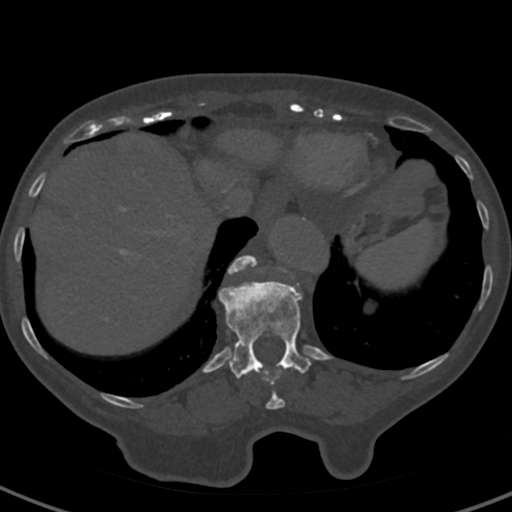
[im 166/183  soft-tissue]
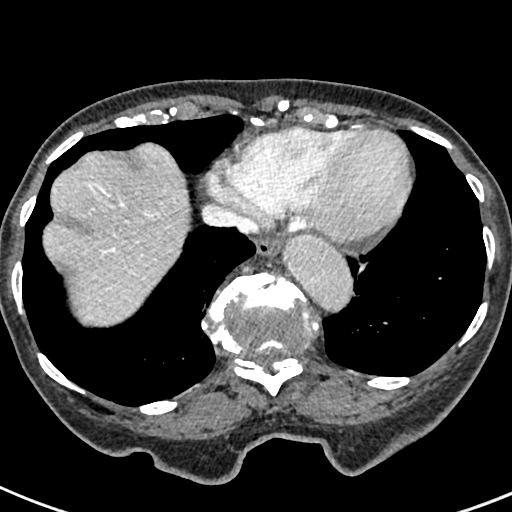

[14 of 46 positions shown; findings below may reference images not displayed]

FINDINGS: Lower chest: No acute abnormality.

Hepatobiliary: No focal liver abnormality is seen. No gallstones,
gallbladder wall thickening, or biliary dilatation.

Pancreas: Unremarkable. No pancreatic ductal dilatation or
surrounding inflammatory changes.

Spleen: Normal in size without focal abnormality.

Adrenals/Urinary Tract: Adrenal glands are unremarkable. There is a
simple, exophytic cyst of the lateral midportion of the left kidney
measuring 1.3 cm, fluid attenuation without associated contrast
enhancement. Kidneys are otherwise normal, without renal calculi,
solid lesion, or hydronephrosis.

Stomach/Bowel: Stomach is within normal limits. No evidence of bowel
wall thickening, distention, or inflammatory changes.

Vascular/Lymphatic: Aortic atherosclerosis. There is an enlarged
portacaval lymph node measuring 4.3 x 1.9 cm (series 15, image 70).
Additional enlarged celiac axis and aortocaval nodes measuring up to
1.9 x 1.3 cm (series 15, image 79).

Other: No abdominal wall hernia or abnormality.

Musculoskeletal: No acute or significant osseous findings.
IMPRESSION: 1. There is a simple, exophytic cyst of the lateral midportion of
the left kidney measuring 1.3 cm, fluid attenuation without
associated contrast enhancement. No evidence of suspicious renal
lesion or contrast enhancement
2. There is an enlarged portacaval lymph node measuring 4.3 x
cm. Additional enlarged celiac axis and aortocaval nodes measuring
up to 1.9 x 1.3 cm. Although nonspecific, differential
considerations include both reactive and neoplastic etiology.
Consider follow-up CT in 3 months to assess for stability or
resolution.

Aortic Atherosclerosis (DANO2-EBM.M).

## 2021-10-08 ENCOUNTER — Other Ambulatory Visit: Payer: Self-pay | Admitting: Internal Medicine

## 2021-10-16 ENCOUNTER — Other Ambulatory Visit: Payer: Self-pay | Admitting: Internal Medicine

## 2021-12-02 ENCOUNTER — Other Ambulatory Visit: Payer: Self-pay | Admitting: Internal Medicine

## 2021-12-03 ENCOUNTER — Other Ambulatory Visit: Payer: Self-pay

## 2021-12-03 MED ORDER — PRAVASTATIN SODIUM 20 MG PO TABS: 20.0000 mg | ORAL_TABLET | Freq: Every day | ORAL | 0 refills | Status: DC

## 2021-12-16 ENCOUNTER — Other Ambulatory Visit: Payer: Self-pay | Admitting: Internal Medicine

## 2021-12-27 ENCOUNTER — Other Ambulatory Visit: Payer: Self-pay | Admitting: Internal Medicine

## 2022-01-06 ENCOUNTER — Other Ambulatory Visit: Payer: Self-pay | Admitting: Internal Medicine

## 2022-01-10 ENCOUNTER — Other Ambulatory Visit: Payer: Self-pay | Admitting: Internal Medicine

## 2022-01-13 ENCOUNTER — Ambulatory Visit (INDEPENDENT_AMBULATORY_CARE_PROVIDER_SITE_OTHER): Payer: Medicare PPO | Admitting: Internal Medicine

## 2022-01-13 ENCOUNTER — Encounter: Payer: Self-pay | Admitting: Internal Medicine

## 2022-01-13 VITALS — BP 130/92 | HR 94 | Temp 97.9°F | Ht 64.8 in | Wt 145.2 lb

## 2022-01-13 DIAGNOSIS — E1122 Type 2 diabetes mellitus with diabetic chronic kidney disease: Secondary | ICD-10-CM | POA: Diagnosis not present

## 2022-01-13 DIAGNOSIS — K5909 Other constipation: Secondary | ICD-10-CM

## 2022-01-13 DIAGNOSIS — Z6824 Body mass index (BMI) 24.0-24.9, adult: Secondary | ICD-10-CM

## 2022-01-13 DIAGNOSIS — I129 Hypertensive chronic kidney disease with stage 1 through stage 4 chronic kidney disease, or unspecified chronic kidney disease: Secondary | ICD-10-CM

## 2022-01-13 DIAGNOSIS — N1831 Chronic kidney disease, stage 3a: Secondary | ICD-10-CM

## 2022-01-13 DIAGNOSIS — Z0000 Encounter for general adult medical examination without abnormal findings: Secondary | ICD-10-CM | POA: Diagnosis not present

## 2022-01-13 DIAGNOSIS — Z853 Personal history of malignant neoplasm of breast: Secondary | ICD-10-CM

## 2022-01-13 DIAGNOSIS — M7989 Other specified soft tissue disorders: Secondary | ICD-10-CM | POA: Diagnosis not present

## 2022-01-13 DIAGNOSIS — E7800 Pure hypercholesterolemia, unspecified: Secondary | ICD-10-CM

## 2022-01-13 LAB — POCT URINALYSIS DIPSTICK
Bilirubin, UA: NEGATIVE
Blood, UA: NEGATIVE
Glucose, UA: NEGATIVE
Ketones, UA: NEGATIVE
Nitrite, UA: NEGATIVE
Protein, UA: POSITIVE — AB
Spec Grav, UA: <=1.005 — AB (ref 1.010–1.025)
Urobilinogen, UA: 0.2 E.U./dL
pH, UA: 5.5 (ref 5.0–8.0)

## 2022-01-13 NOTE — Progress Notes (Signed)
Rich Brave Llittleton,acting as a Education administrator for [name], MD.,have documented all relevant documentation on the behalf of [name], MD,as directed by  [name], MD while in the presence of [name], MD.   Subjective:     Patient ID: Shannon Mcconnell , female    DOB: [birth year] , 88 y.o.   MRN: [id number]   Chief Complaint  Patient presents with   Annual Exam   Hypertension   Diabetes    HPI  The patient is here for a physical exam.  She is no longer followed by GYN. She has no specific concerns or complaints at this time.  She reports compliance with meds. She denies headaches, chest pain and shortness of breath.   Hypertension This is a chronic problem. The current episode started more than 1 year ago. The problem has been gradually improving since onset. The problem is controlled. Pertinent negatives include no blurred vision. Past treatments include calcium channel blockers, angiotensin blockers and diuretics. The current treatment provides moderate improvement. There are no compliance problems.  Hypertensive end-organ damage includes kidney disease.  Diabetes She presents for her follow-up diabetic visit. She has type 2 diabetes mellitus. Pertinent negatives for diabetes include no blurred vision. She has not had a previous visit with a dietitian. She participates in exercise intermittently. An ACE inhibitor/angiotensin II receptor blocker is being taken.     Past Medical History:  Diagnosis Date   DM (diabetes mellitus) ([place])    High cholesterol    Hypertension      Family History  Problem Relation Age of Onset   Hypertension Mother    Hypertension Father      Current Outpatient Medications:    amLODipine (NORVASC) 10 MG tablet, Take 1 tablet by mouth once daily, Disp: 90 tablet, Rfl: 0   Blood Glucose Monitoring Suppl (TRUE METRIX METER) w/Device KIT, USE AS DIRECTED TO CHECK BLOOD SUGAR TWICE DAILY DX: E11.65, Disp: 1 kit, Rfl: 1    Cholecalciferol (VITAMIN D3) 50 MCG (2000 UT) capsule, TAKE 1 CAPSULE BY MOUTH TWICE DAILY **DISCONTINUE  VITAMIN  D2  50,000  IU**, Disp: 60 capsule, Rfl: 0   glucose blood (RELION TRUE METRIX TEST STRIPS) test strip, USE AS DIRECTED TWICE DAILY dx: e11.9, Disp: 50 each, Rfl: 11   hydrochlorothiazide (MICROZIDE) 12.5 MG capsule, Take 1 capsule by mouth once daily (Patient not taking: Reported on 01/28/2022), Disp: 90 capsule, Rfl: 0   olmesartan (BENICAR) 40 MG tablet, Take 1 tablet by mouth once daily, Disp: 90 tablet, Rfl: 0   pravastatin (PRAVACHOL) 20 MG tablet, Take 1 tablet (20 mg total) by mouth daily., Disp: 90 tablet, Rfl: 0   No Known Allergies    The patient states she uses status post hysterectomy for birth control. Last LMP was No LMP recorded. Patient has had a hysterectomy.. Negative for Dysmenorrhea. Negative for: breast discharge, breast lump(s), breast pain and breast self exam. Associated symptoms include abnormal vaginal bleeding. Pertinent negatives include abnormal bleeding (hematology), anxiety, decreased libido, depression, difficulty falling sleep, dyspareunia, history of infertility, nocturia, sexual dysfunction, sleep disturbances, urinary incontinence, urinary urgency, vaginal discharge and vaginal itching. Diet regular.The patient states her exercise level is  intermittent.  . The patient's tobacco use is:  Social History   Tobacco Use  Smoking Status Never  Smokeless Tobacco Never  . She has been exposed to passive smoke. The patient's alcohol use is:  Social History   Substance and Sexual Activity  Alcohol Use  Never    Review of Systems  Constitutional: Negative.   HENT: Negative.    Eyes: Negative.  Negative for blurred vision.  Respiratory: Negative.    Cardiovascular: Negative.   Gastrointestinal:  Positive for constipation.  Endocrine: Negative.   Genitourinary: Negative.   Musculoskeletal: Negative.   Skin: Negative.   Allergic/Immunologic:  Negative.   Neurological: Negative.   Hematological: Negative.   Psychiatric/Behavioral: Negative.       Today's Vitals   01/13/22 1128 01/13/22 1211  BP: (!) 140/98 (!) 130/92  Pulse: 94   Temp: 97.9 F (36.6 C)   Weight: 145 lb 3.2 oz (65.9 kg)   Height: 5' 4.8" (1.646 m)   PainSc: 0-No pain    Body mass index is 24.31 kg/m.  Wt Readings from Last 3 Encounters:  01/28/22 143 lb 9.6 oz (65.1 kg)  01/13/22 145 lb 3.2 oz (65.9 kg)  09/04/21 149 lb (67.6 kg)     Objective:  Physical Exam Vitals and nursing note reviewed.  Constitutional:      Appearance: Normal appearance.  HENT:     Head: Normocephalic and atraumatic.     Right Ear: Tympanic membrane, ear canal and external ear normal.     Left Ear: Tympanic membrane, ear canal and external ear normal.     Nose: Nose normal.     Mouth/Throat:     Mouth: Mucous membranes are moist.     Pharynx: Oropharynx is clear.  Eyes:     Extraocular Movements: Extraocular movements intact.     Conjunctiva/sclera: Conjunctivae normal.     Pupils: Pupils are equal, round, and reactive to light.  Cardiovascular:     Rate and Rhythm: Normal rate and regular rhythm.     Pulses: Normal pulses.     Heart sounds: Normal heart sounds.  Pulmonary:     Effort: Pulmonary effort is normal.     Breath sounds: Normal breath sounds.  Chest:  Breasts:    Tanner Score is 5.     Right: Normal.     Left: Absent.     Comments: S/p left mastectomy Abdominal:     General: Abdomen is flat. Bowel sounds are normal.     Palpations: Abdomen is soft.  Genitourinary:    Comments: deferred Musculoskeletal:        General: Normal range of motion.     Cervical back: Normal range of motion and neck supple.     Comments: Right 5th finger DIP joint swelling, no overlying erythema  There is soft tissue mass,mobile , nontender - lower back, lateral to spine  Skin:    General: Skin is warm and dry.  Neurological:     General: No focal deficit  present.     Mental Status: She is alert and oriented to person, place, and time.  Psychiatric:        Mood and Affect: Mood normal.        Behavior: Behavior normal.      Assessment And Plan:     1. Encounter for general adult medical examination w/o abnormal findings Comments: A full exam was performed. Importance of monthly self breast exams was discussed with the patient. PATIENT IS ADVISED TO GET 30-45 MINUTES REGULAR EXERCISE NO LESS THAN FOUR TO FIVE DAYS PER WEEK - BOTH WEIGHTBEARING EXERCISES AND AEROBIC ARE RECOMMENDED.  PATIENT IS ADVISED TO FOLLOW A HEALTHY DIET WITH AT LEAST SIX FRUITS/VEGGIES PER DAY, DECREASE INTAKE OF RED MEAT, AND TO INCREASE FISH INTAKE TO TWO  DAYS PER WEEK.  MEATS/FISH SHOULD NOT BE FRIED, BAKED OR BROILED IS PREFERABLE.  IT IS ALSO IMPORTANT TO CUT BACK ON YOUR SUGAR INTAKE. PLEASE AVOID ANYTHING WITH ADDED SUGAR, CORN SYRUP OR OTHER SWEETENERS. IF YOU MUST USE A SWEETENER, YOU CAN TRY STEVIA. IT IS ALSO IMPORTANT TO AVOID ARTIFICIALLY SWEETENERS AND DIET BEVERAGES. LASTLY, I SUGGEST WEARING SPF 50 SUNSCREEN ON EXPOSED PARTS AND ESPECIALLY WHEN IN THE DIRECT SUNLIGHT FOR AN EXTENDED PERIOD OF TIME.  PLEASE AVOID FAST FOOD RESTAURANTS AND INCREASE YOUR WATER INTAKE.   2. Hypertensive nephropathy Comments: Chronic, uncontrolled today. EKG performed, NSR w/ LAFB and neg T waves and occ ectopic ventricular beat - no new changes. Agrees to nurse visit f/u 2 wks. She will c/w olmesartan in am and amlodipine in PM for now. Will resume HCTZ if BP is persistently elevated.  - POCT Urinalysis Dipstick (81002) - EKG 12-Lead - Microalbumin / Creatinine Urine Ratio - CBC - CMP14+EGFR  3. Type 2 diabetes mellitus with stage 3a chronic kidney disease, without long-term current use of insulin ([facility]) Comments: Diabetic foot exam was performed. I will request her 2022 eye exam. I DISCUSSED WITH THE PATIENT AT LENGTH REGARDING THE GOALS OF GLYCEMIC CONTROL AND POSSIBLE  LONG-TERM COMPLICATIONS.  I  ALSO STRESSED THE IMPORTANCE OF COMPLIANCE WITH HOME GLUCOSE MONITORING, DIETARY RESTRICTIONS INCLUDING AVOIDANCE OF SUGARY DRINKS/PROCESSED FOODS,  ALONG WITH REGULAR EXERCISE.  I  ALSO STRESSED THE IMPORTANCE OF ANNUAL EYE EXAMS, SELF FOOT CARE AND COMPLIANCE WITH OFFICE VISITS.  - Hemoglobin A1c - PTH, intact and calcium - Phosphorus - Protein electrophoresis, serum  4. Chronic constipation Comments: She is encouraged to stay well hydrated and take fiber supplements. If persistent, try Miralax. Will try Linzess if ineffective.   5. Pure hypercholesterolemia Comments: Chronic, currently taking pravastatin 64m daily. LDL goal <70.  - Lipid panel  6. Soft tissue mass Comments: I will refer her for ultrasound of this area. Suggestive of lipoma.   7. BMI 24.0-24.9, adult Comments: She is encouraged to aim for at least 150 minutes of exercise per week, this includes chair exercises.   8. History of breast cancer Comments: She is s/p left mastectomy. Encouraged to continue to perform monthly SBE.    Patient was given opportunity to ask questions. Patient verbalized understanding of the plan and was able to repeat key elements of the plan. All questions were answered to their satisfaction.   I, RMaximino Greenland MD, have reviewed all documentation for this visit. The documentation on 01/13/22 for the exam, diagnosis, procedures, and orders are all accurate and complete.   THE PATIENT IS ENCOURAGED TO PRACTICE SOCIAL DISTANCING DUE TO THE COVID-19 PANDEMIC.

## 2022-01-13 NOTE — Patient Instructions (Signed)

## 2022-01-14 DIAGNOSIS — I639 Cerebral infarction, unspecified: Secondary | ICD-10-CM

## 2022-01-14 HISTORY — DX: Cerebral infarction, unspecified: I63.9

## 2022-01-15 LAB — PROTEIN ELECTROPHORESIS, SERUM
A/G Ratio: 1 (ref 0.7–1.7)
Albumin ELP: 3.8 g/dL (ref 2.9–4.4)
Alpha 1: 0.3 g/dL (ref 0.0–0.4)
Alpha 2: 0.7 g/dL (ref 0.4–1.0)
Beta: 1.3 g/dL (ref 0.7–1.3)
Gamma Globulin: 1.6 g/dL (ref 0.4–1.8)
Globulin, Total: 3.9 g/dL (ref 2.2–3.9)

## 2022-01-15 LAB — LIPID PANEL
Chol/HDL Ratio: 2.6 ratio (ref 0.0–4.4)
Cholesterol, Total: 192 mg/dL (ref 100–199)
HDL: 73 mg/dL (ref >39)
LDL Chol Calc (NIH): 104 mg/dL — ABNORMAL HIGH (ref 0–99)
Triglycerides: 85 mg/dL (ref 0–149)
VLDL Cholesterol Cal: 15 mg/dL (ref 5–40)

## 2022-01-15 LAB — MICROALBUMIN / CREATININE URINE RATIO
Creatinine, Urine: 50.9 mg/dL
Microalb/Creat Ratio: 239 mg/g creat — ABNORMAL HIGH (ref 0–29)
Microalbumin, Urine: 121.8 ug/mL

## 2022-01-15 LAB — HEMOGLOBIN A1C
Est. average glucose Bld gHb Est-mCnc: 123 mg/dL
Hgb A1c MFr Bld: 5.9 % — ABNORMAL HIGH (ref 4.8–5.6)

## 2022-01-15 LAB — CMP14+EGFR
ALT: 10 IU/L (ref 0–32)
AST: 20 IU/L (ref 0–40)
Albumin/Globulin Ratio: 1.2 (ref 1.2–2.2)
Albumin: 4.2 g/dL (ref 3.7–4.7)
Alkaline Phosphatase: 64 IU/L (ref 44–121)
BUN/Creatinine Ratio: 18 (ref 12–28)
BUN: 29 mg/dL — ABNORMAL HIGH (ref 8–27)
Bilirubin Total: 0.7 mg/dL (ref 0.0–1.2)
CO2: 25 mmol/L (ref 20–29)
Calcium: 9.7 mg/dL (ref 8.7–10.3)
Chloride: 97 mmol/L (ref 96–106)
Creatinine, Ser: 1.62 mg/dL — ABNORMAL HIGH (ref 0.57–1.00)
Globulin, Total: 3.5 g/dL (ref 1.5–4.5)
Glucose: 101 mg/dL — ABNORMAL HIGH (ref 70–99)
Potassium: 4.5 mmol/L (ref 3.5–5.2)
Sodium: 136 mmol/L (ref 134–144)
Total Protein: 7.7 g/dL (ref 6.0–8.5)
eGFR: 30 mL/min/{1.73_m2} — ABNORMAL LOW (ref >59)

## 2022-01-15 LAB — CBC
Hematocrit: 35 % (ref 34.0–46.6)
Hemoglobin: 11.6 g/dL (ref 11.1–15.9)
MCH: 29.4 pg (ref 26.6–33.0)
MCHC: 33.1 g/dL (ref 31.5–35.7)
MCV: 89 fL (ref 79–97)
Platelets: 238 10*3/uL (ref 150–450)
RBC: 3.94 x10E6/uL (ref 3.77–5.28)
RDW: 13.1 % (ref 11.7–15.4)
WBC: 4.9 10*3/uL (ref 3.4–10.8)

## 2022-01-15 LAB — PTH, INTACT AND CALCIUM: PTH: 51 pg/mL (ref 15–65)

## 2022-01-15 LAB — PHOSPHORUS: Phosphorus: 3.7 mg/dL (ref 3.0–4.3)

## 2022-01-20 ENCOUNTER — Other Ambulatory Visit: Payer: Self-pay | Admitting: Internal Medicine

## 2022-01-22 ENCOUNTER — Other Ambulatory Visit: Payer: Self-pay

## 2022-01-22 DIAGNOSIS — N1832 Chronic kidney disease, stage 3b: Secondary | ICD-10-CM

## 2022-01-28 ENCOUNTER — Ambulatory Visit: Payer: Medicare PPO

## 2022-01-28 VITALS — BP 140/80 | HR 56 | Temp 98.1°F | Ht 64.0 in | Wt 143.6 lb

## 2022-01-28 DIAGNOSIS — I129 Hypertensive chronic kidney disease with stage 1 through stage 4 chronic kidney disease, or unspecified chronic kidney disease: Secondary | ICD-10-CM

## 2022-01-28 NOTE — Progress Notes (Signed)
Pt presents today for bpc. She reports taking olmesartan 40mg  in the am. Amlodipine 10mg  at night. She did take medication at 7:30am this morning before appointment. Patient also states she drinks 5 glasses of water a day. She also walks when she can.  BP Readings from Last 3 Encounters:  01/28/22 (!) 140/80  01/13/22 (!) 130/92  09/04/21 118/80  Patient declined resuming HCTZ, she states it gave her high BP readings she would rather stay off of medication. Patient comes back for nurse visit 04/15/2022. Next appointment with provider 07/16/2022.

## 2022-02-08 ENCOUNTER — Emergency Department (HOSPITAL_COMMUNITY): Payer: Medicare PPO

## 2022-02-08 ENCOUNTER — Inpatient Hospital Stay (HOSPITAL_COMMUNITY)
Admission: EM | Admit: 2022-02-08 | Discharge: 2022-02-10 | DRG: 066 | Disposition: A | Discharge status: Home / Self Care | Payer: Medicare PPO | Attending: Internal Medicine | Admitting: Internal Medicine

## 2022-02-08 ENCOUNTER — Inpatient Hospital Stay (HOSPITAL_COMMUNITY): Payer: Medicare PPO

## 2022-02-08 ENCOUNTER — Encounter (HOSPITAL_COMMUNITY): Payer: Self-pay

## 2022-02-08 DIAGNOSIS — R29700 NIHSS score 0: Secondary | ICD-10-CM | POA: Diagnosis not present

## 2022-02-08 DIAGNOSIS — R200 Anesthesia of skin: Secondary | ICD-10-CM | POA: Diagnosis not present

## 2022-02-08 DIAGNOSIS — N1832 Chronic kidney disease, stage 3b: Secondary | ICD-10-CM | POA: Diagnosis not present

## 2022-02-08 DIAGNOSIS — Z90710 Acquired absence of both cervix and uterus: Secondary | ICD-10-CM

## 2022-02-08 DIAGNOSIS — R29810 Facial weakness: Secondary | ICD-10-CM | POA: Diagnosis present

## 2022-02-08 DIAGNOSIS — I6389 Other cerebral infarction: Secondary | ICD-10-CM | POA: Diagnosis not present

## 2022-02-08 DIAGNOSIS — I10 Essential (primary) hypertension: Secondary | ICD-10-CM | POA: Diagnosis present

## 2022-02-08 DIAGNOSIS — I129 Hypertensive chronic kidney disease with stage 1 through stage 4 chronic kidney disease, or unspecified chronic kidney disease: Secondary | ICD-10-CM | POA: Diagnosis present

## 2022-02-08 DIAGNOSIS — E7800 Pure hypercholesterolemia, unspecified: Secondary | ICD-10-CM | POA: Diagnosis present

## 2022-02-08 DIAGNOSIS — R202 Paresthesia of skin: Secondary | ICD-10-CM | POA: Diagnosis present

## 2022-02-08 DIAGNOSIS — R7303 Prediabetes: Secondary | ICD-10-CM | POA: Diagnosis present

## 2022-02-08 DIAGNOSIS — I6381 Other cerebral infarction due to occlusion or stenosis of small artery: Principal | ICD-10-CM | POA: Diagnosis present

## 2022-02-08 DIAGNOSIS — Z20822 Contact with and (suspected) exposure to covid-19: Secondary | ICD-10-CM | POA: Diagnosis present

## 2022-02-08 DIAGNOSIS — K219 Gastro-esophageal reflux disease without esophagitis: Secondary | ICD-10-CM | POA: Diagnosis present

## 2022-02-08 DIAGNOSIS — Z9012 Acquired absence of left breast and nipple: Secondary | ICD-10-CM

## 2022-02-08 DIAGNOSIS — K5730 Diverticulosis of large intestine without perforation or abscess without bleeding: Secondary | ICD-10-CM

## 2022-02-08 DIAGNOSIS — G319 Degenerative disease of nervous system, unspecified: Secondary | ICD-10-CM | POA: Diagnosis not present

## 2022-02-08 DIAGNOSIS — I639 Cerebral infarction, unspecified: Principal | ICD-10-CM | POA: Diagnosis present

## 2022-02-08 DIAGNOSIS — Z8249 Family history of ischemic heart disease and other diseases of the circulatory system: Secondary | ICD-10-CM | POA: Diagnosis not present

## 2022-02-08 DIAGNOSIS — K449 Diaphragmatic hernia without obstruction or gangrene: Secondary | ICD-10-CM | POA: Insufficient documentation

## 2022-02-08 DIAGNOSIS — Z79899 Other long term (current) drug therapy: Secondary | ICD-10-CM

## 2022-02-08 DIAGNOSIS — E1122 Type 2 diabetes mellitus with diabetic chronic kidney disease: Secondary | ICD-10-CM | POA: Diagnosis present

## 2022-02-08 DIAGNOSIS — N183 Chronic kidney disease, stage 3 unspecified: Secondary | ICD-10-CM | POA: Diagnosis present

## 2022-02-08 HISTORY — DX: Gastro-esophageal reflux disease without esophagitis: K21.9

## 2022-02-08 HISTORY — DX: Diverticulosis of large intestine without perforation or abscess without bleeding: K57.30

## 2022-02-08 LAB — I-STAT CHEM 8, ED
BUN: 26 mg/dL — ABNORMAL HIGH (ref 8–23)
Calcium, Ion: 1.2 mmol/L (ref 1.15–1.40)
Chloride: 102 mmol/L (ref 98–111)
Creatinine, Ser: 1.4 mg/dL — ABNORMAL HIGH (ref 0.44–1.00)
Glucose, Bld: 106 mg/dL — ABNORMAL HIGH (ref 70–99)
HCT: 40 % (ref 36.0–46.0)
Hemoglobin: 13.6 g/dL (ref 12.0–15.0)
Potassium: 3.9 mmol/L (ref 3.5–5.1)
Sodium: 139 mmol/L (ref 135–145)
TCO2: 24 mmol/L (ref 22–32)

## 2022-02-08 LAB — COMPREHENSIVE METABOLIC PANEL
ALT: 12 U/L (ref 0–44)
AST: 19 U/L (ref 15–41)
Albumin: 4 g/dL (ref 3.5–5.0)
Alkaline Phosphatase: 54 U/L (ref 38–126)
Anion gap: 7 (ref 5–15)
BUN: 29 mg/dL — ABNORMAL HIGH (ref 8–23)
CO2: 26 mmol/L (ref 22–32)
Calcium: 9.4 mg/dL (ref 8.9–10.3)
Chloride: 106 mmol/L (ref 98–111)
Creatinine, Ser: 1.38 mg/dL — ABNORMAL HIGH (ref 0.44–1.00)
GFR, Estimated: 37 mL/min — ABNORMAL LOW (ref >60)
Glucose, Bld: 106 mg/dL — ABNORMAL HIGH (ref 70–99)
Potassium: 3.9 mmol/L (ref 3.5–5.1)
Sodium: 139 mmol/L (ref 135–145)
Total Bilirubin: 0.8 mg/dL (ref 0.3–1.2)
Total Protein: 7.9 g/dL (ref 6.5–8.1)

## 2022-02-08 LAB — ETHANOL: Alcohol, Ethyl (B): <10 mg/dL (ref <10)

## 2022-02-08 LAB — APTT: aPTT: 28 seconds (ref 24–36)

## 2022-02-08 LAB — URINALYSIS, ROUTINE W REFLEX MICROSCOPIC
Bacteria, UA: NONE SEEN
Bilirubin Urine: NEGATIVE
Glucose, UA: NEGATIVE mg/dL
Hgb urine dipstick: NEGATIVE
Ketones, ur: 5 mg/dL — AB
Leukocytes,Ua: NEGATIVE
Nitrite: NEGATIVE
Protein, ur: 100 mg/dL — AB
Specific Gravity, Urine: 1.01 (ref 1.005–1.030)
pH: 6 (ref 5.0–8.0)

## 2022-02-08 LAB — CBC
HCT: 38.5 % (ref 36.0–46.0)
Hemoglobin: 12.6 g/dL (ref 12.0–15.0)
MCH: 30.2 pg (ref 26.0–34.0)
MCHC: 32.7 g/dL (ref 30.0–36.0)
MCV: 92.3 fL (ref 80.0–100.0)
Platelets: 234 10*3/uL (ref 150–400)
RBC: 4.17 MIL/uL (ref 3.87–5.11)
RDW: 14.2 % (ref 11.5–15.5)
WBC: 4.4 10*3/uL (ref 4.0–10.5)
nRBC: 0 % (ref 0.0–0.2)

## 2022-02-08 LAB — RAPID URINE DRUG SCREEN, HOSP PERFORMED
Amphetamines: NOT DETECTED
Barbiturates: NOT DETECTED
Benzodiazepines: NOT DETECTED
Cocaine: NOT DETECTED
Opiates: NOT DETECTED
Tetrahydrocannabinol: NOT DETECTED

## 2022-02-08 LAB — CBG MONITORING, ED: Glucose-Capillary: 106 mg/dL — ABNORMAL HIGH (ref 70–99)

## 2022-02-08 LAB — DIFFERENTIAL
Abs Immature Granulocytes: 0 10*3/uL (ref 0.00–0.07)
Basophils Absolute: 0 10*3/uL (ref 0.0–0.1)
Basophils Relative: 1 %
Eosinophils Absolute: 0.1 10*3/uL (ref 0.0–0.5)
Eosinophils Relative: 2 %
Immature Granulocytes: 0 %
Lymphocytes Relative: 30 %
Lymphs Abs: 1.3 10*3/uL (ref 0.7–4.0)
Monocytes Absolute: 0.4 10*3/uL (ref 0.1–1.0)
Monocytes Relative: 9 %
Neutro Abs: 2.5 10*3/uL (ref 1.7–7.7)
Neutrophils Relative %: 58 %

## 2022-02-08 LAB — PROTIME-INR
INR: 0.9 (ref 0.8–1.2)
Prothrombin Time: 12.4 seconds (ref 11.4–15.2)

## 2022-02-08 LAB — RESP PANEL BY RT-PCR (FLU A&B, COVID) ARPGX2
Influenza A by PCR: NEGATIVE
Influenza B by PCR: NEGATIVE
SARS Coronavirus 2 by RT PCR: NEGATIVE

## 2022-02-08 LAB — SAMPLE TO BLOOD BANK

## 2022-02-08 MED ORDER — INSULIN ASPART 100 UNIT/ML IJ SOLN
0.0000 [IU] | Freq: Three times a day (TID) | INTRAMUSCULAR | Status: DC
  Administered 2022-02-09: 1 [IU] via SUBCUTANEOUS
  Filled 2022-02-08: qty 0.09

## 2022-02-08 MED ORDER — FAMOTIDINE 20 MG PO TABS
20.0000 mg | ORAL_TABLET | Freq: Every day | ORAL | Status: DC
  Administered 2022-02-08 – 2022-02-09 (×2): 20 mg via ORAL
  Filled 2022-02-08 (×2): qty 1

## 2022-02-08 MED ORDER — ONDANSETRON HCL 4 MG/2ML IJ SOLN: 4.0000 mg | Freq: Four times a day (QID) | INTRAMUSCULAR | Status: DC | PRN

## 2022-02-08 MED ORDER — STROKE: EARLY STAGES OF RECOVERY BOOK
Freq: Once | Status: AC
Start: 2022-02-09 — End: 2022-02-09
  Filled 2022-02-08: qty 1

## 2022-02-08 MED ORDER — ACETAMINOPHEN 650 MG RE SUPP: 650.0000 mg | Freq: Four times a day (QID) | RECTAL | Status: DC | PRN

## 2022-02-08 MED ORDER — ONDANSETRON HCL 4 MG PO TABS: 4.0000 mg | ORAL_TABLET | Freq: Four times a day (QID) | ORAL | Status: DC | PRN

## 2022-02-08 MED ORDER — HYDRALAZINE HCL 20 MG/ML IJ SOLN: 10.0000 mg | INTRAMUSCULAR | Status: DC | PRN

## 2022-02-08 MED ORDER — ACETAMINOPHEN 325 MG PO TABS: 650.0000 mg | ORAL_TABLET | Freq: Four times a day (QID) | ORAL | Status: DC | PRN

## 2022-02-08 MED ORDER — SODIUM CHLORIDE 0.9 % IV SOLN: INTRAVENOUS | Status: DC

## 2022-02-08 MED ORDER — ENOXAPARIN SODIUM 30 MG/0.3ML IJ SOSY
30.0000 mg | PREFILLED_SYRINGE | INTRAMUSCULAR | Status: DC
  Administered 2022-02-08 – 2022-02-09 (×2): 30 mg via SUBCUTANEOUS
  Filled 2022-02-08 (×2): qty 0.3

## 2022-02-08 NOTE — ED Triage Notes (Signed)
Pt presents with c/o numbness around her lips and her right hand approx one hour ago. Pt denies any trouble walking or ambulating. Pt does have some decreased sensation on the right side, slight facial droop on the right side and slight decreased grip strength on the right side.

## 2022-02-08 NOTE — ED Provider Triage Note (Signed)
Emergency Medicine Provider Triage Evaluation Note  Dicie Edelen , a 88 y.o. female  was evaluated in triage.  Pt complains of numbness to the right side of her mouth and right fingers 1 hours pta. .  Review of Systems  Positive:  Negative:   Physical Exam  There were no vitals taken for this visit. Gen:   Awake, no distress   Resp:  Normal effort  MSK:   Moves extremities without difficulty  Other:  Slight facial droop on the right. Moderate decrease strenght with right hand grip. Decrease sensation in the patients upper extremities. Sensation of the face and cranial nerves grossly intact other than the mentions slight droop. Using appropriate speech.  Medical Decision Making  Medically screening exam initiated at 10:36 AM.  Appropriate orders placed.  Tya Haughey was informed that the remainder of the evaluation will be completed by another provider, this initial triage assessment does not replace that evaluation, and the importance of remaining in the ED until their evaluation is complete.  Code stroke ordered. Patient being roomed now.    Sherrell Puller, PA-C 02/08/22 1038

## 2022-02-08 NOTE — ED Notes (Signed)
Transported to MRI by MRI tech

## 2022-02-08 NOTE — Consult Note (Signed)
Code stroke cart activated at 61 Dr Cheral Marker on screen at 74 Dr Cheral Marker notified of CT results at [place] Dr Cheral Marker paged for TS at 1041 and again at 1055 Pt to CT at 1045 Pt back from CT at 1058

## 2022-02-08 NOTE — Consult Note (Signed)
[facility] TeleNeurology Consult Services    Date of Service:  02/08/2022    Metrics: Last Known Well: Shortly after 9:00 AM Symptoms: As per HPI.  Patient is not a candidate for thrombolytic: Symptoms too mild to treat   Location of the provider: [facility]  Location of the patient: [facility] Pre-Morbid Modified Rankin Scale: 0 Time neurologist arrived:  10:57 AM Time NIHSS completed: 11:14 AM    This consult was provided via telemedicine with 2-way video and audio communication. The patient/family was informed that care would be provided in this way and agreed to receive care in this manner.   ED Physician notified of diagnostic impression and management plan at: 11:19 AM   Assessment: 88 year old female with a PMHx of DM, hypercholesterolemia and HTN who presents to the [facility] ED with acute onset of tingling and numbness of her lips on the right, in conjunction with right hand numbness and tingling.   - Exam is nonfocal. NIHSS 0.  - DDx for her presentation includes an acute left thalamic lacunar infarction versus radiculopathy versus carpal tunnel syndrome - Stroke risk factors: DM, hypercholesterolemia and HTN    Recommendations: 1. HgbA1c, fasting lipid panel 2. MRI of the brain without contrast 3. Due to low eGFR, obtain vascular imaging with MRA head and carotid ultrasound 4. TTE 5. Continue her statin 6. Start ASA 81 mg po qd 7. Risk factor modification 8. Telemetry monitoring 9. Frequent neuro checks 10. NPO until passes stroke swallow screen 11. PT consult, OT consult, Speech consult 12. Glycemic control 13. Modified permissive HTN protocol for the next 24 hours. Treat SBP if > 180. Tomorrow at 9 AM, goal for BP management should be to reduce by 20% per day to a final goal of 120/80.     ------------------------------------------------------------------------------   History of Present Illness: 88 year old female with a PMHx of  DM, hypercholesterolemia and HTN who presents to the [facility] ED with acute onset of tingling and numbness of her lips on the right, in conjunction with right hand numbness and tingling. She also noted a headache on awakening this morning which is now resolved. Her symptoms began shortly after 9 AM. Triage RN noted slightl facial droop on the right and slightly decreased right grip strength.    She denies vertigo, lightheadedness, vision changes, confusion, aphasia or symptomatic weakness. Also with no difficulty ambulating.      Past Medical History: Past Medical History:  Diagnosis Date   DM (diabetes mellitus) ([place])    High cholesterol    Hypertension      Past Surgical History: Past Surgical History:  Procedure Laterality Date   ABDOMINAL HYSTERECTOMY     mastectomy Left      Medications:  No current facility-administered medications on file prior to encounter.   Current Outpatient Medications on File Prior to Encounter  Medication Sig Dispense Refill   amLODipine (NORVASC) 10 MG tablet Take 1 tablet by mouth once daily (Patient taking differently: Take 10 mg by mouth every morning.) 90 tablet 0   Menthol, Topical Analgesic, (ICY HOT EX) Apply 1 Application topically daily as needed (knee pain).     Multiple Vitamins-Minerals (ADULT ONE DAILY GUMMIES) CHEW Chew 1 tablet by mouth every morning.     olmesartan (BENICAR) 40 MG tablet Take 1 tablet by mouth once daily (Patient taking differently: Take 40 mg by mouth at bedtime.) 90 tablet 0   OVER THE COUNTER MEDICATION Take 1 tablet by  mouth every morning. "Laminine" - amino acid supplement     pravastatin (PRAVACHOL) 20 MG tablet Take 1 tablet (20 mg total) by mouth daily. (Patient taking differently: Take 20 mg by mouth at bedtime.) 90 tablet 0   Blood Glucose Monitoring Suppl (TRUE METRIX METER) w/Device KIT USE AS DIRECTED TO CHECK BLOOD SUGAR TWICE DAILY DX: E11.65 1 kit 1   Cholecalciferol (VITAMIN D3) 50 MCG (2000 UT) capsule TAKE 1  CAPSULE BY MOUTH TWICE DAILY **DISCONTINUE  VITAMIN  D2  50,000  IU** (Patient not taking: Reported on 02/08/2022) 60 capsule 0   glucose blood (RELION TRUE METRIX TEST STRIPS) test strip USE AS DIRECTED TWICE DAILY dx: e11.9 50 each 11   hydrochlorothiazide (MICROZIDE) 12.5 MG capsule Take 1 capsule by mouth once daily (Patient not taking: Reported on 01/28/2022) 90 capsule 0      Social History: Has never used EtOH Never smoker   Family History:  Reviewed in Epic   ROS: As per HPI    Anticoagulant use:  None   Antiplatelet use: None   Examination:   Vitals: BP 160/90, Pulse 93 , CBG 103   1A: Level of Consciousness - 0 1B: Ask Month and Age - 0 1C: Blink Eyes & Squeeze Hands - 0 2: Test Horizontal Extraocular Movements - 0 3: Test Visual Fields - 0 4: Test Facial Palsy (Use Grimace if Obtunded) - 0 5A: Test Left Arm Motor Drift - 0 5B: Test Right Arm Motor Drift - 0 6A: Test Left Leg Motor Drift - 0 6B: Test Right Leg Motor Drift - 0 7: Test Limb Ataxia (FNF/Heel-Shin) - 0 8: Test Sensation -  0 9: Test Language/Aphasia - 0 10: Test Dysarthria - Severe Dysarthria: 0 11: Test Extinction/Inattention - Extinction to bilateral simultaneous stimulation 0   NIHSS Score: 0     Patient/Family was informed the Neurology Consult would occur via TeleHealth consult by way of interactive audio and video telecommunications and consented to receiving care in this manner.   Patient is being evaluated for possible acute neurologic impairment and high pretest probability of imminent or life-threatening deterioration. I spent total of 40 minutes providing care to this patient, including time for face to face visit via telemedicine, review of medical records, imaging studies and discussion of findings with providers, the patient and/or family.   Electronically signed: Dr. Kerney Elbe

## 2022-02-08 NOTE — ED Provider Notes (Signed)
[facility] DEPT Provider Note   CSN: [id number] Arrival date & time: 02/08/22  1025     History  Chief Complaint  Patient presents with   Numbness    Shannon Mcconnell is a 88 y.o. female.  Patient is an 88 year old female with a past medical history of hypertension and diabetes presenting to the emergency department with right-sided numbness.  Patient states when she woke up this morning she was feeling like her normal self.  She states that shortly after 9 AM she started to develop numbness on her right lower face, right hand and right lower leg.  She denied any associated weakness or vision changes.  She is here with her family who reports no change in her voice or speech.  The patient states that she was feeling well prior to today with no recent fevers, vomiting or diarrhea.  She did say that she recently stopped taking her Lasix about a month ago but otherwise has had no recent medication changes.  The history is provided by the patient and a relative.       Home Medications Prior to Admission medications   Medication Sig Start Date End Date Taking? Authorizing Provider  amLODipine (NORVASC) 10 MG tablet Take 1 tablet by mouth once daily Patient taking differently: Take 10 mg by mouth every morning. 01/11/22  Yes Glendale Chard, MD  Menthol, Topical Analgesic, (ICY HOT EX) Apply 1 Application topically daily as needed (knee pain).   Yes [provider]  Multiple Vitamins-Minerals (ADULT ONE DAILY GUMMIES) CHEW Chew 1 tablet by mouth every morning.   Yes [provider]  olmesartan (BENICAR) 40 MG tablet Take 1 tablet by mouth once daily Patient taking differently: Take 40 mg by mouth at bedtime. 01/06/22  Yes Glendale Chard, MD  OVER THE COUNTER MEDICATION Take 1 tablet by mouth every morning. "Laminine" - amino acid supplement   Yes [provider]  pravastatin (PRAVACHOL) 20 MG tablet Take 1 tablet (20 mg total) by mouth  daily. Patient taking differently: Take 20 mg by mouth at bedtime. 12/03/21  Yes Glendale Chard, MD  Blood Glucose Monitoring Suppl (TRUE METRIX METER) w/Device KIT USE AS DIRECTED TO CHECK BLOOD SUGAR TWICE DAILY DX: E11.65 05/19/19   Glendale Chard, MD  Cholecalciferol (VITAMIN D3) 50 MCG (2000 UT) capsule TAKE 1 CAPSULE BY MOUTH TWICE DAILY **DISCONTINUE  VITAMIN  D2  50,000  IU** Patient not taking: Reported on 02/08/2022 06/21/18   Glendale Chard, MD  glucose blood (RELION TRUE METRIX TEST STRIPS) test strip USE AS DIRECTED TWICE DAILY dx: e11.9 08/27/21   Glendale Chard, MD  hydrochlorothiazide (MICROZIDE) 12.5 MG capsule Take 1 capsule by mouth once daily Patient not taking: Reported on 01/28/2022 12/16/21   Glendale Chard, MD      Allergies    Patient has no known allergies.    Review of Systems   Review of Systems  Physical Exam Updated Vital Signs BP (!) 171/98   Pulse 83   Temp 98.7 F (37.1 C) (Oral)   Resp (!) 23   Ht '5\' 4"'  (1.626 m)   Wt 68.9 kg   SpO2 100%   BMI 26.09 kg/m  Physical Exam Vitals and nursing note reviewed.  Constitutional:      General: She is not in acute distress.    Appearance: Normal appearance.  HENT:     Head: Normocephalic and atraumatic.     Nose: Nose normal.     Mouth/Throat:  Mouth: Mucous membranes are moist.     Pharynx: Oropharynx is clear.  Eyes:     Extraocular Movements: Extraocular movements intact.     Conjunctiva/sclera: Conjunctivae normal.     Pupils: Pupils are equal, round, and reactive to light.  Cardiovascular:     Rate and Rhythm: Normal rate and regular rhythm.     Pulses: Normal pulses.     Heart sounds: Normal heart sounds.  Pulmonary:     Effort: Pulmonary effort is normal.     Breath sounds: Normal breath sounds.  Abdominal:     General: Abdomen is flat.     Palpations: Abdomen is soft.     Tenderness: There is no abdominal tenderness.  Musculoskeletal:        General: Normal range of motion.     Cervical  back: Normal range of motion and neck supple.  Skin:    General: Skin is warm and dry.  Neurological:     Mental Status: She is alert and oriented to person, place, and time.     Cranial Nerves: No cranial nerve deficit.     Motor: No weakness.     Coordination: Coordination normal.     Comments: Patient has subjectively decreased sensation of V3 of her face, along the medial nerve distribution of her right hand and in her right lower leg No drift in all 4 extremities  Psychiatric:        Mood and Affect: Mood normal.        Behavior: Behavior normal.     ED Results / Procedures / Treatments   Labs (all labs ordered are listed, but only abnormal results are displayed) Labs Reviewed  COMPREHENSIVE METABOLIC PANEL - Abnormal; Notable for the following components:      Result Value   Glucose, Bld 106 (*)    BUN 29 (*)    Creatinine, Ser 1.38 (*)    GFR, Estimated 37 (*)    All other components within normal limits  I-STAT CHEM 8, ED - Abnormal; Notable for the following components:   BUN 26 (*)    Creatinine, Ser 1.40 (*)    Glucose, Bld 106 (*)    All other components within normal limits  CBG MONITORING, ED - Abnormal; Notable for the following components:   Glucose-Capillary 106 (*)    All other components within normal limits  RESP PANEL BY RT-PCR (FLU A&B, COVID) ARPGX2  PROTIME-INR  APTT  CBC  DIFFERENTIAL  ETHANOL  RAPID URINE DRUG SCREEN, HOSP PERFORMED  URINALYSIS, ROUTINE W REFLEX MICROSCOPIC    EKG None  Radiology CT HEAD CODE STROKE WO CONTRAST  Result Date: 02/08/2022 CLINICAL DATA:  Code stroke.  Numbness, location not specified EXAM: CT HEAD WITHOUT CONTRAST TECHNIQUE: Contiguous axial images were obtained from the base of the skull through the vertex without intravenous contrast. RADIATION DOSE REDUCTION: This exam was performed according to the departmental dose-optimization program which includes automated exposure control, adjustment of the mA and/or  kV according to patient size and/or use of iterative reconstruction technique. COMPARISON:  None Available. FINDINGS: Brain: Infarct at the left thalamus, age indeterminate by imaging. Mild chronic small vessel ischemia in the cerebral white matter. No hemorrhage, hydrocephalus, or mass. Vascular: No hyperdense vessel or unexpected calcification. Skull: Normal. Negative for fracture or focal lesion. Sinuses/Orbits: No acute finding. Other: These results were communicated to Dr. Cheral Marker not scored without at 10:58 am on 02/08/2022 by text page via the [facility] messaging system. ASPECTS [facility] Stroke  Program Early CT Score) Localizing symptoms IMPRESSION: Age-indeterminate left thalamic lacunar infarct. Electronically Signed   By: Jorje Guild M.D.   On: 02/08/2022 11:00    Procedures Procedures    Medications Ordered in ED Medications - No data to display  ED Course/ Medical Decision Making/ A&P Clinical Course as of 02/08/22 1546  Sat Feb 08, 2022  1256 Labs reviewed and interpreted by myself, creatinine at baseline, otherwise no significant electrolyte abnormality.  Urine is pending at this time [VK]  1256 Age-indeterminate thalamic lacunar infarct on CT head, MRI is pending [VK]  1526 MRI showed acute infarct of the left lateral thalamus.  The patient will require admission for further stroke work-up and evaluation.  Patient is agreeable with plan. [VK]  4715 Dr. Cheral Marker of neurology recommended admission to hospitalist at [facility] and neurology will follow as consult [VK]    Clinical Course User Index [VK] Ottie Glazier, DO                           Medical Decision Making This patient presents to the ED with chief complaint(s) of right-sided numbness with pertinent past medical history of hypertension, diabetes  which further complicates the presenting complaint. The complaint involves an extensive differential diagnosis and also carries with it a high risk of complications and  morbidity.    The differential diagnosis includes CVA, TIA, electrolyte abnormality, ICH or mass effect unlikely as the patient has no headache, considering peripheral neuropathy though less likely as in multiple different nerve root distributions  Additional history obtained: Additional history obtained from family  ED Course and Reassessment: Patient was make a stroke alert from the waiting room for her new onset right-sided numbness.  Patient's NIHSS is 0 for me as she is subjective paresthesias in her right upper extremity but otherwise has no focal neurodeficits.  Blood pressure was mildly elevated in the 953X systolic and glucose was within normal range.  Telemetry neurology was obtained and the patient was transported to CT scan to further evaluate for CVA.  The patient's imaging was reviewed by neurology who showed no obvious strokes.  He is concerned for possible TIA or lacunar infarct and recommended MRI.  The patient is not a TNK candidate.  Independent labs interpretation:  The following labs were independently interpreted: Labs within normal range  Independent visualization of imaging: - I independently visualized the following imaging with scope of interpretation limited to determining acute life threatening conditions related to emergency care: CT head, MRI, which revealed acute thalamic infarct on the left  Consultation: - Consulted or discussed management/test interpretation w/ external professional: Neurology  Consideration for admission or further workup: Patient has an acute infarct and requires admission for further stroke work-up.  She will be admitted in stable condition. Social Determinants of health: N/A    Amount and/or Complexity of Data Reviewed Radiology: ordered.  Risk Decision regarding hospitalization.           Final Clinical Impression(s) / ED Diagnoses Final diagnoses:  None    Rx / DC Orders ED Discharge Orders     None          Ottie Glazier, DO 02/08/22 1547

## 2022-02-08 NOTE — H&P (Signed)
History and Physical    Patient: Shannon Mcconnell RDE:[id number] DOB: Dec 06, 1931 DOA: 02/08/2022 DOS: the patient was seen and examined on 02/08/2022 PCP: Glendale Chard, MD  Patient coming from: Home  Chief Complaint:  Chief Complaint  Patient presents with   Numbness   HPI: Shannon Mcconnell is a 88 y.o. female with medical history significant of type 2 diabetes, hyperlipidemia, hypertension, at stage III kidney disease who presented to the emergency department with complaints of numbness in her right side area of the lips associated with right hand numbness and tingling since last night.  She had a mild headache in the morning.  No expressive aphasia, dysarthria, vertigo sensation, double or blurry vision.  She was able to ambulate without any trouble.  No fever, chills or night sweats. No sore throat, rhinorrhea, dyspnea, wheezing or hemoptysis.  No chest pain, palpitations, diaphoresis, PND, orthopnea or pitting edema of the lower extremities.  No appetite changes, abdominal pain, diarrhea, constipation, melena or hematochezia.  No flank pain, dysuria, frequency or hematuria.  No polyuria, polydipsia, polyphagia or blurred vision.  ED course: Initial vital signs were temperature 98.7 F, pulse 93, respiration 18, BP 167/113 mmHg O2 sat 100% on room air.  Lab work: CBC, PT, INR and PTT were normal.  Negative coronavirus and influenza PCR.  CMP showed a glucose of 106, BUN 29 creatinine 1.38 mg/dL.  Rest of the CMP values were normal.  Imaging: Code stroke CT head showed an age-indeterminate left thalamic lacunar infarct.  MRI of the brain without contrast shows 3 mm acute nonhemorrhagic infarct in the lateral aspect of the left thalamus.  There was also moderate generalized atrophy and diffuse white matter disease likely from chronic microvascular ischemia.  There is a remote lacunar infarct of the posterior left cerebellum.  Review of Systems: As mentioned in the history of present illness.  All other systems reviewed and are negative. Past Medical History:  Diagnosis Date   DM (diabetes mellitus) ([place])    High cholesterol    Hypertension    Past Surgical History:  Procedure Laterality Date   ABDOMINAL HYSTERECTOMY     mastectomy Left    Social History:  reports that she has never smoked. She has never used smokeless tobacco. She reports that she does not drink alcohol and does not use drugs.  No Known Allergies  Family History  Problem Relation Age of Onset   Hypertension Mother    Hypertension Father     Prior to Admission medications   Medication Sig Start Date End Date Taking? Authorizing Provider  amLODipine (NORVASC) 10 MG tablet Take 1 tablet by mouth once daily Patient taking differently: Take 10 mg by mouth every morning. 01/11/22  Yes Glendale Chard, MD  Menthol, Topical Analgesic, (ICY HOT EX) Apply 1 Application topically daily as needed (knee pain).   Yes [provider]  Multiple Vitamins-Minerals (ADULT ONE DAILY GUMMIES) CHEW Chew 1 tablet by mouth every morning.   Yes [provider]  olmesartan (BENICAR) 40 MG tablet Take 1 tablet by mouth once daily Patient taking differently: Take 40 mg by mouth at bedtime. 01/06/22  Yes Glendale Chard, MD  OVER THE COUNTER MEDICATION Take 1 tablet by mouth every morning. "Laminine" - amino acid supplement   Yes [provider]  pravastatin (PRAVACHOL) 20 MG tablet Take 1 tablet (20 mg total) by mouth daily. Patient taking differently: Take 20 mg by mouth at bedtime. 12/03/21  Yes Glendale Chard, MD  Blood Glucose Monitoring Suppl (TRUE  METRIX METER) w/Device KIT USE AS DIRECTED TO CHECK BLOOD SUGAR TWICE DAILY DX: E11.65 05/19/19   Glendale Chard, MD  Cholecalciferol (VITAMIN D3) 50 MCG (2000 UT) capsule TAKE 1 CAPSULE BY MOUTH TWICE DAILY **DISCONTINUE  VITAMIN  D2  50,000  IU** Patient not taking: Reported on 02/08/2022 06/21/18   Glendale Chard, MD  glucose blood (RELION TRUE METRIX TEST  STRIPS) test strip USE AS DIRECTED TWICE DAILY dx: e11.9 08/27/21   Glendale Chard, MD  hydrochlorothiazide (MICROZIDE) 12.5 MG capsule Take 1 capsule by mouth once daily Patient not taking: Reported on 01/28/2022 12/16/21   Glendale Chard, MD    Physical Exam: Vitals:   02/08/22 1630 02/08/22 1645 02/08/22 1700 02/08/22 1707  BP: (!) 166/94 (!) 162/91 (!) 160/86   Pulse: 83 84 81   Resp: _0 Temp:    97.6 F (36.4 C)  TempSrc:    Oral  SpO2: 100% 100% 100%   Weight:      Height:       Physical Exam Vitals and nursing note reviewed.  Constitutional:      General: She is awake. She is not in acute distress.    Appearance: Normal appearance.  HENT:     Head: Normocephalic and atraumatic.     Nose: No rhinorrhea.     Mouth/Throat:     Mouth: Mucous membranes are moist.  Eyes:     General: Scleral icterus present.     Pupils: Pupils are equal, round, and reactive to light.  Neck:     Vascular: No JVD.  Cardiovascular:     Rate and Rhythm: Normal rate and regular rhythm.     Heart sounds: S1 normal and S2 normal.  Pulmonary:     Effort: Pulmonary effort is normal.     Breath sounds: Normal breath sounds. No wheezing, rhonchi or rales.  Abdominal:     General: Bowel sounds are normal. There is no distension.     Palpations: Abdomen is soft.     Tenderness: There is no abdominal tenderness. There is no guarding or rebound.  Musculoskeletal:     Cervical back: Neck supple.     Right lower leg: No edema.     Left lower leg: No edema.  Skin:    General: Skin is warm and dry.  Neurological:     Mental Status: She is alert and oriented to person, place, and time.     Sensory: Sensory deficit present.     Comments: Paresthesia in the right lip area.  Psychiatric:        Mood and Affect: Mood normal.        Behavior: Behavior normal. Behavior is cooperative.    Data Reviewed:  There are no new results to review at this time.  Assessment and Plan: Principal  Problem:   Acute CVA (cerebrovascular accident) ([place]) Observation/PCU. Frequent neurochecks. Consult PT and OT. Check fasting lipids. Check hemoglobin A1c. Check carotid Doppler. Check echocardiogram. Check MRI of brain. Risk factors modifications. Stroke team input appreciated.  Active Problems:   Hypertension Allow permissive hypertension. Hold amlodipine, hydrochlorothiazide and olmesartan. Hydralazine 10 mg IVP every 4 hours as needed for SBP .  180 mmHg. See neurology notes for further details.    Type 2 diabetes mellitus with  stage 3 chronic kidney disease,  without long-term current use of insulin (HCC) Carbohydrate modified diet. CBG monitoring before meals and bedtime. RI SS 3 times daily before meals.    Pure  hypercholesterolemia Continue pravastatin 20 mg p.o. bedtime.    Gastroesophageal reflux disease Famotidine 20 mg p.o. bedtime.     Advance Care Planning:   Code Status: Full code.  Consults: Neuro hospitalist team.  Family Communication: Several her family members were with her.  Severity of Illness: The appropriate patient status for this patient is INPATIENT. Inpatient status is judged to be reasonable and necessary in order to provide the required intensity of service to ensure the patient's safety. The patient's presenting symptoms, physical exam findings, and initial radiographic and laboratory data in the context of their chronic comorbidities is felt to place them at high risk for further clinical deterioration. Furthermore, it is not anticipated that the patient will be medically stable for discharge from the hospital within 2 midnights of admission.   * I certify that at the point of admission it is my clinical judgment that the patient will require inpatient hospital care spanning beyond 2 midnights from the point of admission due to high intensity of service, high risk for further deterioration and high frequency of surveillance  required.*  Author: Reubin Milan, MD 02/08/2022 5:18 PM  For on call review [email or web address].   This document was prepared using Dragon voice recognition software and may contain some unintended transcription errors.

## 2022-02-09 ENCOUNTER — Inpatient Hospital Stay (HOSPITAL_COMMUNITY): Payer: Medicare PPO

## 2022-02-09 ENCOUNTER — Other Ambulatory Visit: Payer: Self-pay

## 2022-02-09 DIAGNOSIS — I639 Cerebral infarction, unspecified: Secondary | ICD-10-CM

## 2022-02-09 DIAGNOSIS — I10 Essential (primary) hypertension: Secondary | ICD-10-CM | POA: Diagnosis not present

## 2022-02-09 DIAGNOSIS — I6389 Other cerebral infarction: Secondary | ICD-10-CM

## 2022-02-09 LAB — LIPID PANEL
Cholesterol: 156 mg/dL (ref 0–200)
HDL: 58 mg/dL (ref >40)
LDL Cholesterol: 90 mg/dL (ref 0–99)
Total CHOL/HDL Ratio: 2.7 RATIO
Triglycerides: 41 mg/dL (ref <150)
VLDL: 8 mg/dL (ref 0–40)

## 2022-02-09 LAB — BASIC METABOLIC PANEL
Anion gap: 7 (ref 5–15)
BUN: 24 mg/dL — ABNORMAL HIGH (ref 8–23)
CO2: 24 mmol/L (ref 22–32)
Calcium: 8.8 mg/dL — ABNORMAL LOW (ref 8.9–10.3)
Chloride: 109 mmol/L (ref 98–111)
Creatinine, Ser: 1.09 mg/dL — ABNORMAL HIGH (ref 0.44–1.00)
GFR, Estimated: 49 mL/min — ABNORMAL LOW (ref >60)
Glucose, Bld: 95 mg/dL (ref 70–99)
Potassium: 3.6 mmol/L (ref 3.5–5.1)
Sodium: 140 mmol/L (ref 135–145)

## 2022-02-09 LAB — ECHOCARDIOGRAM COMPLETE
AR max vel: 3.97 cm2
AV Peak grad: 6.7 mmHg
Ao pk vel: 1.29 m/s
Area-P 1/2: 3.42 cm2
Height: 64 in
MV M vel: 4.49 m/s
MV Peak grad: 80.6 mmHg
P 1/2 time: 549 msec
S' Lateral: 3.1 cm
Weight: 2432 oz

## 2022-02-09 LAB — CBG MONITORING, ED
Glucose-Capillary: 102 mg/dL — ABNORMAL HIGH (ref 70–99)
Glucose-Capillary: 91 mg/dL (ref 70–99)
Glucose-Capillary: 133 mg/dL — ABNORMAL HIGH (ref 70–99)

## 2022-02-09 LAB — HEMOGLOBIN A1C
Hgb A1c MFr Bld: 5.9 % — ABNORMAL HIGH (ref 4.8–5.6)
Mean Plasma Glucose: 122.63 mg/dL

## 2022-02-09 LAB — GLUCOSE, CAPILLARY
Glucose-Capillary: 119 mg/dL — ABNORMAL HIGH (ref 70–99)
Glucose-Capillary: 100 mg/dL — ABNORMAL HIGH (ref 70–99)

## 2022-02-09 MED ORDER — ASPIRIN 81 MG PO TBEC
81.0000 mg | DELAYED_RELEASE_TABLET | Freq: Every day | ORAL | Status: DC
  Administered 2022-02-09 – 2022-02-10 (×2): 81 mg via ORAL
  Filled 2022-02-09 (×2): qty 1

## 2022-02-09 MED ORDER — PRAVASTATIN SODIUM 20 MG PO TABS: 20.0000 mg | ORAL_TABLET | Freq: Every day | ORAL | Status: DC

## 2022-02-09 MED ORDER — CLOPIDOGREL BISULFATE 75 MG PO TABS
75.0000 mg | ORAL_TABLET | Freq: Every day | ORAL | Status: DC
  Administered 2022-02-09 – 2022-02-10 (×2): 75 mg via ORAL
  Filled 2022-02-09 (×2): qty 1

## 2022-02-09 MED ORDER — AMLODIPINE BESYLATE 5 MG PO TABS: 5.0000 mg | ORAL_TABLET | Freq: Every day | ORAL | Status: DC

## 2022-02-09 MED ORDER — AMLODIPINE BESYLATE 10 MG PO TABS
10.0000 mg | ORAL_TABLET | Freq: Every day | ORAL | Status: DC
  Administered 2022-02-09 – 2022-02-10 (×2): 10 mg via ORAL
  Filled 2022-02-09: qty 1
  Filled 2022-02-09: qty 2

## 2022-02-09 MED ORDER — HYDRALAZINE HCL 20 MG/ML IJ SOLN: 10.0000 mg | INTRAMUSCULAR | Status: DC | PRN

## 2022-02-09 MED ORDER — ATORVASTATIN CALCIUM 40 MG PO TABS
80.0000 mg | ORAL_TABLET | Freq: Every day | ORAL | Status: DC
  Administered 2022-02-09 – 2022-02-10 (×2): 80 mg via ORAL
  Filled 2022-02-09 (×2): qty 2

## 2022-02-09 NOTE — Plan of Care (Signed)
Neurology Recommendations   Shannon Mcconnell is a 88 y.o. female DM, hypercholesterolemia and HTN who presents to the [facility] ED with acute onset of tingling and numbness of her lips on the right, in conjunction with right hand numbness and tingling.  She also noted a headache after awakening on the morning of 8/26. Her symptoms began shortly after 9 AM. Triage RN noted slight facial droop on the right and slightly decreased right grip strength.    Labs Reviewed: LDL- 52 Ha1C- 5.9   Imaging I have reviewed the images obtained:  CT-head Age-indeterminate left thalamic lacunar infarct.  MRI examination of the brain 1. 6 mm acute nonhemorrhagic infarct in the lateral aspect of the left thalamus. 2. Moderate generalized atrophy and diffuse white matter disease likely reflects the sequela of chronic microvascular ischemia. 3. Remote lacunar infarct of the posterior left cerebellum.  MRA Head Normal intracranial MRA  Vas Carotid US Right Carotid: Velocities in the right ICA are consistent with a 1-39% stenosis.  The ECA appears <50% stenosed.   Left Carotid: Velocities in the left ICA are consistent with a 1-39% stenosis.  The ECA appears <50% stenosed.   Vertebrals:  Bilateral vertebral arteries demonstrate antegrade flow.  Subclavians: Normal flow hemodynamics were seen in bilateral subclavian arteries.   2D ECHO- Pending  Left thalamic infarct likely due to small vessel disease  Recommendations discussed with Dr. Leonie Man and Dr. Tawanna Solo - DAPT with ASA and Plavix for 3 weeks and then ASA 81mg  alone - Full dose statin - follow up with Neurology outpatient in 8-12 weeks

## 2022-02-09 NOTE — Progress Notes (Signed)
Bilateral carotid duplex study completed. Please see CV Proc for preliminary results.  Shenita Trego BS, RVT 02/09/2022 9:31 AM

## 2022-02-09 NOTE — Progress Notes (Signed)
PROGRESS NOTE  Shannon Mcconnell  WJX:[id number] DOB: [birth year] DOA: 02/08/2022 PCP: Glendale Chard, MD   Brief Narrative:  Patient is a 88 year old female with history of type 2 diabetes, hyperlipidemia, hypertension, stage III a CKD who presented here with complaint of numbness on the right side, tingling, mild headache from home.  No motor weakness or confusion.  On presentation ,she was hypertensive, code stroke was called in the emergency department.  CT head shows age-indeterminate left thalamic lacunar infarct.  MRI of the brain showed 6 mm acute nonhemorrhagic infarct in the lateral aspect of the left thalamus.  Neurology consulted, stroke work-up initiated, plan for transfer to [facility].  Assessment & Plan:  Principal Problem:   Acute CVA (cerebrovascular accident) ([place]) Active Problems:   Type 2 diabetes mellitus with stage 3 chronic kidney disease, without long-term current use of insulin ([place])   Pure hypercholesterolemia   Gastroesophageal reflux disease   Hypertension   Acute nonhemorrhagic CVA: Presented with tingling, numbness on the right side.  No focal motor neurodeficits on presentation, no weakness.  Code stroke called, stroke work-up initiated.  Neurology consulted. CT head shows age-indeterminate left thalamic lacunar infarct.  MRI of the brain showed 6 mm acute nonhemorrhagic infarct in the lateral aspect of the left thalamus.  Normal intracranial MRA LDL of 90.  A1c of 5.9.  Carotid Doppler did not show any significant stenosis echocardiogram pending.  PT/OT screening done, she was ambulating well so she did not need PT/OT evaluation. She was alert and oriented and was speaking in full sentences.  She was complaining of some numbness on the right side of the face/lips and hand but feeling better. Has been started on aspirin and Plavix as per neurology.  Prediabetes: Apparently, she is not on any medications at home.  Continue sliding scale insulin for now.  Monitor blood  sugars.  A1 is 5.9.  Likely this can be controlled with diet and exercise if possible,may  not need any medications on discharge  Hypertension: Takes amlodipine, olmesartan, hydrochlorothiazide at home.  Blood pressure will be gradually controlled, preventing sudden correction of blood pressure.  We will start on amlodipine 10 mg daily.  Continue as needed medications for severe hypertension.  CKD3b: Currently kidney function at baseline.  Her creatinine ranged from 1.3-1.6  Hyperlipidemia: LDL of 90.  Takes pravastatin.  Might need to to start on high intensity statin, goal LDL is less than 70 due to stroke.        DVT prophylaxis:enoxaparin (LOVENOX) injection 30 mg Start: 02/08/22 2200     Code Status: Full Code  Family Communication: called and discussed with daughter on phone on 8/27  Patient status:Inpatient  Patient is from :Home  Anticipated discharge OZ:HYQM  Estimated DC date: after neurology clearance   Consultants: neurology  Procedures:None  Antimicrobials:  Anti-infectives (From admission, onward)    None       Subjective: Patient seen and examined at the bedside this morning.  Hemodynamically stable during evaluation.  Alert and oriented.  Lying on bed.  Complains of some numbness on the right side of the lip.  Denies any other weakness.  Speaking in full sentences.  Objective: Vitals:   02/09/22 0429 02/09/22 0430 02/09/22 0700 02/09/22 0813  BP:  139/82 (!) 151/98   Pulse:  81 73   Resp:  18 20   Temp: 98.3 F (36.8 C)   97.9 F (36.6 C)  TempSrc: Oral   Oral  SpO2:  97% 99%   Weight:  Height:       No intake or output data in the 24 hours ending 02/09/22 0815 Filed Weights   02/08/22 1105  Weight: 68.9 kg    Examination:  General exam: Overall comfortable, not in distress, pleasant elderly female HEENT: PERRL Respiratory system:  no wheezes or crackles  Cardiovascular system: S1 & S2 heard, RRR.  Gastrointestinal system:  Abdomen is nondistended, soft and nontender. Central nervous system: Alert and oriented Extremities: No edema, no clubbing ,no cyanosis Skin: No rashes, no ulcers,no icterus     Data Reviewed: I have personally reviewed following labs and imaging studies  CBC: Recent Labs  Lab 02/08/22 1045 02/08/22 1051  WBC 4.4  --   NEUTROABS 2.5  --   HGB 12.6 13.6  HCT 38.5 40.0  MCV 92.3  --   PLT 234  --    Basic Metabolic Panel: Recent Labs  Lab 02/08/22 1045 02/08/22 1051 02/09/22 0511  NA 139 139 140  K 3.9 3.9 3.6  CL 106 102 109  CO2 26  --  24  GLUCOSE 106* 106* 95  BUN 29* 26* 24*  CREATININE 1.38* 1.40* 1.09*  CALCIUM 9.4  --  8.8*     Recent Results (from the past 240 hour(s))  Resp Panel by RT-PCR (Flu A&B, Covid) Anterior Nasal Swab     Status: None   Collection Time: 02/08/22 10:36 AM   Specimen: Anterior Nasal Swab  Result Value Ref Range Status   SARS Coronavirus 2 by RT PCR NEGATIVE NEGATIVE Final    Comment: (NOTE) SARS-CoV-2 target nucleic acids are NOT DETECTED.  The SARS-CoV-2 RNA is generally detectable in upper respiratory specimens during the acute phase of infection. The lowest concentration of SARS-CoV-2 viral copies this assay can detect is 138 copies/mL. A negative result does not preclude SARS-Cov-2 infection and should not be used as the sole basis for treatment or other patient management decisions. A negative result may occur with  improper specimen collection/handling, submission of specimen other than nasopharyngeal swab, presence of viral mutation(s) within the areas targeted by this assay, and inadequate number of viral copies(<138 copies/mL). A negative result must be combined with clinical observations, patient history, and epidemiological information. The expected result is Negative.  Fact Sheet for Patients:  EntrepreneurPulse.com.au  Fact Sheet for Healthcare Providers:   IncredibleEmployment.be  This test is no t yet approved or cleared by the [place] FDA and  has been authorized for detection and/or diagnosis of SARS-CoV-2 by FDA under an Emergency Use Authorization (EUA). This EUA will remain  in effect (meaning this test can be used) for the duration of the COVID-19 declaration under Section 564(b)(1) of the Act, 21 U.S.C.section 360bbb-3(b)(1), unless the authorization is terminated  or revoked sooner.       Influenza A by PCR NEGATIVE NEGATIVE Final   Influenza B by PCR NEGATIVE NEGATIVE Final    Comment: (NOTE) The Xpert Xpress SARS-CoV-2/FLU/RSV plus assay is intended as an aid in the diagnosis of influenza from Nasopharyngeal swab specimens and should not be used as a sole basis for treatment. Nasal washings and aspirates are unacceptable for Xpert Xpress SARS-CoV-2/FLU/RSV testing.  Fact Sheet for Patients: EntrepreneurPulse.com.au  Fact Sheet for Healthcare Providers: IncredibleEmployment.be  This test is not yet approved or cleared by the [place] FDA and has been authorized for detection and/or diagnosis of SARS-CoV-2 by FDA under an Emergency Use Authorization (EUA). This EUA will remain in effect (meaning this test can be used) for  the duration of the COVID-19 declaration under Section 564(b)(1) of the Act, 21 U.S.C. section 360bbb-3(b)(1), unless the authorization is terminated or revoked.  Performed at [facility], [place] [address] [place], [place] [address]      Radiology Studies: MR ANGIO HEAD WO CONTRAST  Result Date: 02/08/2022 CLINICAL DATA:  Stroke follow-up EXAM: MRA HEAD WITHOUT CONTRAST TECHNIQUE: Angiographic images of the Circle of Willis were acquired using MRA technique without intravenous contrast. COMPARISON:  None Available. FINDINGS: POSTERIOR CIRCULATION: --Vertebral arteries: Normal --Inferior cerebellar arteries: Normal.  --Basilar artery: Normal. --Superior cerebellar arteries: Normal. --Posterior cerebral arteries: Normal. ANTERIOR CIRCULATION: --Intracranial internal carotid arteries: Normal. --Anterior cerebral arteries (ACA): Normal. --Middle cerebral arteries (MCA): Normal. ANATOMIC VARIANTS: None IMPRESSION: Normal intracranial MRA. Electronically Signed   By: Ulyses Jarred M.D.   On: 02/08/2022 20:18   MR BRAIN WO CONTRAST  Result Date: 02/08/2022 CLINICAL DATA:  TIA. Patient presents with numbness around her lips in the right hand 1 hour ago. EXAM: MRI HEAD WITHOUT CONTRAST TECHNIQUE: Multiplanar, multiecho pulse sequences of the brain and surrounding structures were obtained without intravenous contrast. COMPARISON:  CT head without contrast 02/08/2022 FINDINGS: Brain: Subtle focus of restricted diffusion is present in the lateral aspect of left thalamus measuring 6 mm. No other acute infarct present. Moderate generalized atrophy and diffuse white matter disease is present. Focal susceptibility is noted in the anterior right corona radiata. A focus of T2 hyperintensity with the surrounding hemosiderin rim is present in the same location. No acute hemorrhage is present. Other remote ischemic changes are present in the thalami bilaterally. White matter changes extend into the brainstem. A remote lacunar infarct is present the posterior left cerebellum. Vascular: Flow is present in the major intracranial arteries. Skull and upper cervical spine: Degenerative changes are present in the upper cervical spine. Sinuses/Orbits: The paranasal sinuses and mastoid air cells are clear. Bilateral lens replacements are noted. Globes and orbits are otherwise unremarkable. IMPRESSION: 1. 6 mm acute nonhemorrhagic infarct in the lateral aspect of the left thalamus. 2. Moderate generalized atrophy and diffuse white matter disease likely reflects the sequela of chronic microvascular ischemia. 3. Remote lacunar infarct of the posterior left  cerebellum. These results were called by telephone at the time of interpretation on 02/08/2022 at 3:06 pm to Dr. Oneal Deputy , who verbally acknowledged these results. Electronically Signed   By: San Morelle M.D.   On: 02/08/2022 15:09   CT HEAD CODE STROKE WO CONTRAST  Result Date: 02/08/2022 CLINICAL DATA:  Code stroke.  Numbness, location not specified EXAM: CT HEAD WITHOUT CONTRAST TECHNIQUE: Contiguous axial images were obtained from the base of the skull through the vertex without intravenous contrast. RADIATION DOSE REDUCTION: This exam was performed according to the departmental dose-optimization program which includes automated exposure control, adjustment of the mA and/or kV according to patient size and/or use of iterative reconstruction technique. COMPARISON:  None Available. FINDINGS: Brain: Infarct at the left thalamus, age indeterminate by imaging. Mild chronic small vessel ischemia in the cerebral white matter. No hemorrhage, hydrocephalus, or mass. Vascular: No hyperdense vessel or unexpected calcification. Skull: Normal. Negative for fracture or focal lesion. Sinuses/Orbits: No acute finding. Other: These results were communicated to Dr. Cheral Marker not scored without at 10:58 am on 02/08/2022 by text page via the [facility] messaging system. [facility] Stroke Program Early CT Score) Localizing symptoms IMPRESSION: Age-indeterminate left thalamic lacunar infarct. Electronically Signed   By: Jorje Guild M.D.   On: 02/08/2022 11:00  Scheduled Meds:   stroke: early stages of recovery book   Does not apply Once   enoxaparin (LOVENOX) injection  30 mg Subcutaneous Q24H   famotidine  20 mg Oral QHS   insulin aspart  0-9 Units Subcutaneous TID WC   pravastatin  20 mg Oral QHS   Continuous Infusions:  sodium chloride 50 mL/hr at 02/08/22 1859     LOS: 1 day   Shelly Coss, MD [facility] P8/27/2023, 8:15 AM

## 2022-02-09 NOTE — Evaluation (Signed)
Occupational Therapy Evaluation Patient Details Name: Shannon Mcconnell MRN: [id number] DOB: Jun 02, 1932 Today's Date: 02/09/2022   History of Present Illness Shannon Mcconnell is a 88 y.o. female with medical history significant of type 2 diabetes, hyperlipidemia, hypertension, at stage III kidney disease who presented to the emergency department with complaints of numbness in her right side area of the lips associated with right hand numbness and tingling since last night. MRI of the brain without contrast shows 3 mm acute nonhemorrhagic infarct in the lateral aspect of the left thalamus.   Clinical Impression   Shannon Mcconnell is an 88 year old woman who presents with above medical history. ON evaluation she demonstrates normal ROM, strength and coordination of upper extremities. Reports sensation deficits in right hand have resolved. Reports she still has numbness on right side of mouth. Reports baseline LE strength. Has been ambulating to the bathroom with nursing. Patient demonstrates ability to transfer on and off stretcher, march in place and ambulate at beside without deficits. She reports no LE deficits, no balance with deficits, no visual changes, no cognitive deficits. Reports she has no ambulation deficits. Patient has no therapy needs at this time.       Recommendations for follow up therapy are one component of a multi-disciplinary discharge planning process, led by the attending physician.  Recommendations may be updated based on patient status, additional functional criteria and insurance authorization.   Follow Up Recommendations  No OT follow up    Assistance Recommended at Discharge None  Patient can return home with the following      Functional Status Assessment  Patient has not had a recent decline in their functional status  Equipment Recommendations  None recommended by OT    Recommendations for Other Services       Precautions / Restrictions  Precautions Precautions: None      Mobility Bed Mobility Overal bed mobility: Independent             General bed mobility comments: Independence with donning socks. Reports independence with toileting. No physical deficits to limit ADLs.    Transfers Overall transfer level: Independent                 General transfer comment: No physical assistance in room to ambulate and march in place. Reports independence with ambulating to bathroom.      Balance Overall balance assessment: No apparent balance deficits (not formally assessed)                                         ADL either performed or assessed with clinical judgement   ADL Overall ADL's : Independent                                             Vision Patient Visual Report: No change from baseline       Perception     Praxis      Pertinent Vitals/Pain Pain Assessment Pain Assessment: No/denies pain     Hand Dominance Right   Extremity/Trunk Assessment Upper Extremity Assessment Upper Extremity Assessment: RUE deficits/detail;LUE deficits/detail RUE Deficits / Details: WNL ROM, 5/5 strength RUE Sensation: WNL RUE Coordination: WNL LUE Deficits / Details: WNL ROm, 5/5 strength LUE Sensation: WNL LUE Coordination: WNL   Lower  Extremity Assessment Lower Extremity Assessment: Overall WFL for tasks assessed   Cervical / Trunk Assessment Cervical / Trunk Assessment: Normal   Communication Communication Communication: No difficulties   Cognition Arousal/Alertness: Awake/alert Behavior During Therapy: WFL for tasks assessed/performed Overall Cognitive Status: Within Functional Limits for tasks assessed                                       General Comments       Exercises     Shoulder Instructions      Home Living Family/patient expects to be discharged to:: Private residence Living Arrangements: Children Available Help at  Discharge: Family;Available PRN/intermittently Type of Home: House Home Access: Stairs to enter [employer] of Steps: 1   Home Layout: One level     Bathroom Shower/Tub: Chief Strategy Officer: Shower seat          Prior Functioning/Environment Prior Level of Function : Independent/Modified Independent             Mobility Comments: no device, drives, goes to the grocery store ADLs Comments: independent, still cooks and cleans        OT Problem List: Impaired sensation      OT Treatment/Interventions:      OT Goals(Current goals can be found in the care plan section) Acute Rehab OT Goals OT Goal Formulation: All assessment and education complete, DC therapy  OT Frequency:      Co-evaluation              AM-PAC OT "6 Clicks" Daily Activity     Outcome Measure Help from another person eating meals?: None Help from another person taking care of personal grooming?: None Help from another person toileting, which includes using toliet, bedpan, or urinal?: None Help from another person bathing (including washing, rinsing, drying)?: None Help from another person to put on and taking off regular upper body clothing?: None Help from another person to put on and taking off regular lower body clothing?: None 6 Click Score: 24   End of Session Nurse Communication:  (No therapy needs.)  Activity Tolerance: Patient tolerated treatment well Patient left: in bed;with call bell/phone within reach  OT Visit Diagnosis: Other symptoms and signs involving the nervous system (R29.898)                Time: 7615-1834 OT Time Calculation (min): 12 min Charges:  OT General Charges $OT Visit: 1 Visit OT Evaluation $OT Eval Low Complexity: 1 Low  Yardley Lekas, OTR/L [place]  Office [phone] Pager: [place] 02/09/2022, 8:40 AM

## 2022-02-09 NOTE — Progress Notes (Signed)
PT Cancellation Note  Patient Details Name: Lilly Gasser MRN: [id number] DOB: [birth year]   Cancelled Treatment:     PT order received but eval deferred.  OT performed eval and screened pt with no PT needs.  Will dc pt from PT service.   Bianka Liberati 02/09/2022, 9:03 AM

## 2022-02-09 NOTE — Progress Notes (Deleted)
Neurology Recommendations  Reason for Consult: Stroke work up Referring Physician: Tawanna Solo  CC: right sided weakness  History is obtained from:Chart Review  HPI: Shannon Mcconnell is a 88 y.o. female DM, hypercholesterolemia and HTN who presents to the [facility] ED with acute onset of tingling and numbness of her lips on the right, in conjunction with right hand numbness and tingling.  She also noted a headache after awakening on the morning of 8/26. Her symptoms began shortly after 9 AM. Triage RN noted slight facial droop on the right and slightly decreased right grip strength.    Labs Reviewed: LDL- 70 Ha1C- 5.9   Imaging I have reviewed the images obtained:  CT-head Age-indeterminate left thalamic lacunar infarct.  MRI examination of the brain 1. 6 mm acute nonhemorrhagic infarct in the lateral aspect of the left thalamus. 2. Moderate generalized atrophy and diffuse white matter disease likely reflects the sequela of chronic microvascular ischemia. 3. Remote lacunar infarct of the posterior left cerebellum.  MRA Head Normal intracranial MRA  Vas Carotid US Right Carotid: Velocities in the right ICA are consistent with a 1-39% stenosis.  The ECA appears <50% stenosed.   Left Carotid: Velocities in the left ICA are consistent with a 1-39% stenosis.  The ECA appears <50% stenosed.   Vertebrals:  Bilateral vertebral arteries demonstrate antegrade flow.  Subclavians: Normal flow hemodynamics were seen in bilateral subclavian arteries.   2D ECHO- Pending  Assessment:  88 y.o. female DM, hypercholesterolemia and HTN who presents to the [facility] ED with acute onset of tingling and numbness of her lips on the right, in conjunction with right hand numbness and tingling.  Impression:  Left thalamic infarct likely due to small vessel disease  Recommendations: - DAPT with ASA and Plavix for 3 weeks and then ASA 81mg  alone - Full dose statin - follow up with Neurology outpatient in 8-12  weeks

## 2022-02-10 DIAGNOSIS — I639 Cerebral infarction, unspecified: Secondary | ICD-10-CM | POA: Diagnosis not present

## 2022-02-10 LAB — GLUCOSE, CAPILLARY: Glucose-Capillary: 105 mg/dL — ABNORMAL HIGH (ref 70–99)

## 2022-02-10 MED ORDER — ASPIRIN 81 MG PO TBEC: 81.0000 mg | DELAYED_RELEASE_TABLET | Freq: Every day | ORAL | 1 refills | Status: DC

## 2022-02-10 MED ORDER — CLOPIDOGREL BISULFATE 75 MG PO TABS: 75.0000 mg | ORAL_TABLET | Freq: Every day | ORAL | 0 refills | Status: AC

## 2022-02-10 MED ORDER — ENOXAPARIN SODIUM 40 MG/0.4ML IJ SOSY: 40.0000 mg | PREFILLED_SYRINGE | INTRAMUSCULAR | Status: DC

## 2022-02-10 MED ORDER — ATORVASTATIN CALCIUM 80 MG PO TABS: 80.0000 mg | ORAL_TABLET | Freq: Every day | ORAL | 1 refills | Status: DC

## 2022-02-10 NOTE — Discharge Summary (Signed)
Physician Discharge Summary  Shannon Mcconnell AOZ:[id number] DOB: 11-17-1931 DOA: 02/08/2022  PCP: Glendale Chard, MD  Admit date: 02/08/2022 Discharge date: 02/10/2022  Admitted From: Home Disposition:  Home  Discharge Condition:Stable CODE STATUS:FULL Diet recommendation: Heart Healthy  Brief/Interim Summary:    Patient is a 88 year old female with history of type 2 diabetes, hyperlipidemia, hypertension, stage III a CKD who presented here with complaint of numbness on the right side, tingling, mild headache from home.  No motor weakness or confusion.  On presentation ,she was hypertensive, code stroke was called in the emergency department.  CT head shows age-indeterminate left thalamic lacunar infarct.  MRI of the brain showed 6 mm acute nonhemorrhagic infarct in the lateral aspect of the left thalamus.  Neurology consulted, stroke work-up initiated and completed.  Medically stable for discharge home today.  Following problems were addressed during her hospitalization:  Acute nonhemorrhagic CVA: Presented with tingling, numbness on the right side.  No focal motor neurodeficits on presentation, no weakness.  Code stroke called, stroke work-up initiated.  Neurology consulted. CT head shows age-indeterminate left thalamic lacunar infarct.  MRI of the brain showed 6 mm acute nonhemorrhagic infarct in the lateral aspect of the left thalamus.  Normal intracranial MRA LDL of 90.  A1c of 5.9.  Carotid Doppler did not show any significant stenosis .echocardiogram  showed normal ejection fraction, no intracardiac source of emboli..  PT/OT screening done, she was ambulating well so she did not need PT/OT evaluation. Has been started on aspirin and Plavix as per neurology.  Continue Plavix for 3 weeks only in total.  Follow-up with neurology in 4 weeks. She does not have any focal neurologic: Deficits   Prediabetes: Apparently, she is not on any medications at home.  Diet controlled . A1 is 5.9.      Hypertension: Takes amlodipine, olmesartan, hydrochlorothiazide at home.  Monitor blood pressure at home  CKD3b: Currently kidney function at baseline or better.  Her creatinine ranged from 1.3-1.6   Hyperlipidemia: LDL of 90.  Takes pravastatin.  Might need to to start on high intensity statin, goal LDL is less than 70 due to stroke.  Changed to  Lipitor       Discharge Diagnoses:  Principal Problem:   Acute CVA (cerebrovascular accident) ([place]) Active Problems:   Type 2 diabetes mellitus with stage 3 chronic kidney disease, without long-term current use of insulin ([place])   Pure hypercholesterolemia   Gastroesophageal reflux disease   Hypertension   Stroke [facility])    Discharge Instructions  Discharge Instructions     Ambulatory referral to Neurology   Complete by: As directed    An appointment is requested in approximately: 4 weeks   Diet - low sodium heart healthy   Complete by: As directed    Discharge instructions   Complete by: As directed    1)Please take prescribed medications as instructed 2)Follow up with your primary care physician in a week 3)Follow up with [facility] neurology in 4 weeks.  Name and number the provider group has been attached 4)Monitor your blood pressure at home   Increase activity slowly   Complete by: As directed       Allergies as of 02/10/2022   No Known Allergies      Medication List     STOP taking these medications    pravastatin 20 MG tablet Commonly known as: PRAVACHOL       TAKE these medications    Adult One Daily Gummies Chew Chew 1 tablet  by mouth every morning.   amLODipine 10 MG tablet Commonly known as: NORVASC Take 1 tablet by mouth once daily What changed: when to take this   aspirin EC 81 MG tablet Take 1 tablet (81 mg total) by mouth daily. Swallow whole. Start taking on: February 11, 2022   atorvastatin 80 MG tablet Commonly known as: LIPITOR Take 1 tablet (80 mg total) by mouth daily. Start taking  on: February 11, 2022   clopidogrel 75 MG tablet Commonly known as: PLAVIX Take 1 tablet (75 mg total) by mouth daily for 19 days. Start taking on: February 11, 2022   hydrochlorothiazide 12.5 MG capsule Commonly known as: MICROZIDE Take 1 capsule by mouth once daily   ICY HOT EX Apply 1 Application topically daily as needed (knee pain).   olmesartan 40 MG tablet Commonly known as: BENICAR Take 1 tablet by mouth once daily What changed: when to take this   OVER THE COUNTER MEDICATION Take 1 tablet by mouth every morning. "Laminine" - amino acid supplement   ReliOn True Metrix Test Strips test strip Generic drug: glucose blood USE AS DIRECTED TWICE DAILY dx: e11.9   True Metrix Meter w/Device Kit USE AS DIRECTED TO CHECK BLOOD SUGAR TWICE DAILY DX: E11.65   Vitamin D3 50 MCG (2000 UT) capsule TAKE 1 CAPSULE BY MOUTH TWICE DAILY **DISCONTINUE  VITAMIN  D2  50,000  IU**        Follow-up Information     Glendale Chard, MD. Schedule an appointment as soon as possible for a visit in 1 week(s).   Specialty: Internal Medicine Contact information: [address] [place] [address] [phone]         [facility]. Schedule an appointment as soon as possible for a visit in 4 week(s).   Specialty: Neurology Contact information: [address] [place] [phone]               No Known Allergies  Consultations: Neurology   Procedures/Studies: ECHOCARDIOGRAM COMPLETE  Result Date: 02/09/2022    ECHOCARDIOGRAM REPORT   Patient Name:   Shannon Mcconnell Date of Exam: 02/09/2022 Medical Rec #:  [id number]        Height:       64.0 in Accession #:    [id number]       Weight:       152.0 lb Date of Birth:  1932-03-25        BSA:          1.741 m Patient Age:    45 years         BP:           148/89 mmHg Patient Gender: F                HR:           77 bpm. Exam Location:  Inpatient Procedure: 2D  Echo, Cardiac Doppler and Color Doppler Indications:    Stroke  History:        Patient has no prior history of Echocardiogram examinations.                 Risk Factors:Hypertension, Diabetes and Dyslipidemia.  Sonographer:    Jefferey Pica Referring Phys: [id number] [place]  1. Left ventricular ejection fraction, by estimation, is 55 to 60%. The left ventricle has normal function. The left ventricle has no regional wall motion abnormalities. There is mild concentric left ventricular hypertrophy.  Left ventricular diastolic function could not be evaluated.  2. Right ventricular systolic function is normal. The right ventricular size is normal. There is normal pulmonary artery systolic pressure.  3. Left atrial size was moderately dilated.  4. Right atrial size was moderately dilated.  5. The mitral valve is normal in structure. Mild mitral valve regurgitation. No evidence of mitral stenosis.  6. The aortic valve is tricuspid. There is mild calcification of the aortic valve. Aortic valve regurgitation is mild. No aortic stenosis is present.  7. The inferior vena cava is normal in size with greater than 50% respiratory variability, suggesting right atrial pressure of 3 mmHg. Comparison(s): No prior Echocardiogram. Conclusion(s)/Recommendation(s): Normal biventricular function without evidence of hemodynamically significant valvular heart disease. No intracardiac source of embolism detected on this transthoracic study. Consider a transesophageal echocardiogram to exclude cardiac source of embolism if clinically indicated. FINDINGS  Left Ventricle: Left ventricular ejection fraction, by estimation, is 55 to 60%. The left ventricle has normal function. The left ventricle has no regional wall motion abnormalities. The left ventricular internal cavity size was normal in size. There is  mild concentric left ventricular hypertrophy. Left ventricular diastolic function could not be evaluated. Right  Ventricle: The right ventricular size is normal. Right vetricular wall thickness was not well visualized. Right ventricular systolic function is normal. There is normal pulmonary artery systolic pressure. The tricuspid regurgitant velocity is 2.76 m/s, and with an assumed right atrial pressure of 3 mmHg, the estimated right ventricular systolic pressure is 70.9 mmHg. Left Atrium: Left atrial size was moderately dilated. Right Atrium: Right atrial size was moderately dilated. Pericardium: There is no evidence of pericardial effusion. Mitral Valve: The mitral valve is normal in structure. There is mild thickening of the mitral valve leaflet(s). Mild mitral valve regurgitation. No evidence of mitral valve stenosis. Tricuspid Valve: The tricuspid valve is normal in structure. Tricuspid valve regurgitation is mild . No evidence of tricuspid stenosis. Aortic Valve: The aortic valve is tricuspid. There is mild calcification of the aortic valve. Aortic valve regurgitation is mild. Aortic regurgitation PHT measures 549 msec. No aortic stenosis is present. Aortic valve peak gradient measures 6.7 mmHg. Pulmonic Valve: The pulmonic valve was grossly normal. Pulmonic valve regurgitation is trivial. No evidence of pulmonic stenosis. Aorta: The aortic root, ascending aorta, aortic arch and descending aorta are all structurally normal, with no evidence of dilitation or obstruction. Venous: The inferior vena cava is normal in size with greater than 50% respiratory variability, suggesting right atrial pressure of 3 mmHg. IAS/Shunts: The atrial septum is grossly normal.  LEFT VENTRICLE PLAX 2D LVIDd:         5.20 cm LVIDs:         3.10 cm LV PW:         1.20 cm LV IVS:        1.10 cm LVOT diam:     2.10 cm LV SV:         92 LV SV Index:   53 LVOT Area:     3.46 cm  RIGHT VENTRICLE            IVC RV Basal diam:  3.50 cm    IVC diam: 1.10 cm RV S prime:     9.68 cm/s LEFT ATRIUM             Index        RIGHT ATRIUM           Index LA  diam:  4.00 cm 2.30 cm/m   RA Area:     17.90 cm LA Vol (A2C):   75.0 ml 43.08 ml/m  RA Volume:   53.20 ml  30.56 ml/m LA Vol (A4C):   56.0 ml 32.17 ml/m LA Biplane Vol: 70.0 ml 40.21 ml/m  AORTIC VALVE                 PULMONIC VALVE AV Area (Vmax): 3.97 cm     PV Vmax:       0.73 m/s AV Vmax:        129.00 cm/s  PV Peak grad:  2.1 mmHg AV Peak Grad:   6.7 mmHg LVOT Vmax:      148.00 cm/s LVOT Vmean:     86.000 cm/s LVOT VTI:       0.267 m AI PHT:         549 msec  AORTA Ao Root diam: 3.50 cm Ao Asc diam:  3.70 cm MITRAL VALVE               TRICUSPID VALVE MV Area (PHT): 3.42 cm    TR Peak grad:   30.5 mmHg MV Decel Time: 222 msec    TR Vmax:        276.00 cm/s MR Peak grad: 80.6 mmHg MR Vmax:      449.00 cm/s  SHUNTS MV E velocity: 55.30 cm/s  Systemic VTI:  0.27 m MV A velocity: 80.60 cm/s  Systemic Diam: 2.10 cm MV E/A ratio:  0.69 Buford Dresser MD Electronically signed by Buford Dresser MD Signature Date/Time: 02/09/2022/4:17:15 PM    Final    VAS US CAROTID (at [facility] and [facility] only)  Result Date: 02/09/2022 Carotid Arterial Duplex Study Patient Name:  SHAWNE BULOW  Date of Exam:   02/09/2022 Medical Rec #: [id number]         Accession #:    [id number] Date of Birth: Mar 09, 1932         Patient Gender: F Patient Age:   30 years Exam Location:  [facility] Procedure:      VAS US CAROTID Referring Phys: Shanon Brow ORTIZ --------------------------------------------------------------------------------  Indications:       CVA. Risk Factors:      Hypertension, hyperlipidemia, Diabetes, no history of                    smoking, prior CVA. Comparison Study:  No previous exam noted. Performing Technologist: Bobetta Lime BS, RVT  Examination Guidelines: A complete evaluation includes B-mode imaging, spectral Doppler, color Doppler, and power Doppler as needed of all accessible portions of each vessel. Bilateral testing is considered an integral part of a complete examination. Limited  examinations for reoccurring indications may be performed as noted.  Right Carotid Findings: +----------+--------+--------+--------+------------------+------------------+           PSV cm/sEDV cm/sStenosisPlaque DescriptionComments           +----------+--------+--------+--------+------------------+------------------+ CCA Prox  51      17                                                   +----------+--------+--------+--------+------------------+------------------+ CCA Distal54      16  intimal thickening +----------+--------+--------+--------+------------------+------------------+ ICA Prox  47      17      1-39%   heterogenous      tortuous           +----------+--------+--------+--------+------------------+------------------+ ICA Distal76      26                                                   +----------+--------+--------+--------+------------------+------------------+ ECA       46      0               heterogenous                         +----------+--------+--------+--------+------------------+------------------+ +----------+--------+-------+----------------+-------------------+           PSV cm/sEDV cmsDescribe        Arm Pressure (mmHG) +----------+--------+-------+----------------+-------------------+ [id number]             Multiphasic, WNL                    +----------+--------+-------+----------------+-------------------+ +---------+--------+--+--------+--+---------+ VertebralPSV cm/s37EDV cm/s12Antegrade +---------+--------+--+--------+--+---------+  Left Carotid Findings: +----------+--------+--------+--------+------------------+------------------+           PSV cm/sEDV cm/sStenosisPlaque DescriptionComments           +----------+--------+--------+--------+------------------+------------------+ CCA Prox  47      13                                intimal thickening  +----------+--------+--------+--------+------------------+------------------+ CCA Distal49      15                                intimal thickening +----------+--------+--------+--------+------------------+------------------+ ICA Prox  103     33      1-39%   heterogenous                         +----------+--------+--------+--------+------------------+------------------+ ICA Distal69      19                                                   +----------+--------+--------+--------+------------------+------------------+ ECA       33      6               heterogenous                         +----------+--------+--------+--------+------------------+------------------+ +----------+--------+--------+----------------+-------------------+           PSV cm/sEDV cm/sDescribe        Arm Pressure (mmHG) +----------+--------+--------+----------------+-------------------+ [id number]              Multiphasic, WNL                    +----------+--------+--------+----------------+-------------------+ +---------+--------+--+--------+--+---------+ VertebralPSV cm/s38EDV cm/s11Antegrade +---------+--------+--+--------+--+---------+   Summary: Right Carotid: Velocities in the right ICA are consistent with a 1-39% stenosis.                The ECA appears <50% stenosed. Left Carotid: Velocities in the left ICA are  consistent with a 1-39% stenosis.               The ECA appears <50% stenosed. Vertebrals:  Bilateral vertebral arteries demonstrate antegrade flow. Subclavians: Normal flow hemodynamics were seen in bilateral subclavian              arteries. *See table(s) above for measurements and observations.  Electronically signed by Antony Contras MD on 02/09/2022 at 12:25:50 PM.    Final    MR ANGIO HEAD WO CONTRAST  Result Date: 02/08/2022 CLINICAL DATA:  Stroke follow-up EXAM: MRA HEAD WITHOUT CONTRAST TECHNIQUE: Angiographic images of the Circle of Willis were acquired using MRA  technique without intravenous contrast. COMPARISON:  None Available. FINDINGS: POSTERIOR CIRCULATION: --Vertebral arteries: Normal --Inferior cerebellar arteries: Normal. --Basilar artery: Normal. --Superior cerebellar arteries: Normal. --Posterior cerebral arteries: Normal. ANTERIOR CIRCULATION: --Intracranial internal carotid arteries: Normal. --Anterior cerebral arteries (ACA): Normal. --Middle cerebral arteries (MCA): Normal. ANATOMIC VARIANTS: None IMPRESSION: Normal intracranial MRA. Electronically Signed   By: Ulyses Jarred M.D.   On: 02/08/2022 20:18   MR BRAIN WO CONTRAST  Result Date: 02/08/2022 CLINICAL DATA:  TIA. Patient presents with numbness around her lips in the right hand 1 hour ago. EXAM: MRI HEAD WITHOUT CONTRAST TECHNIQUE: Multiplanar, multiecho pulse sequences of the brain and surrounding structures were obtained without intravenous contrast. COMPARISON:  CT head without contrast 02/08/2022 FINDINGS: Brain: Subtle focus of restricted diffusion is present in the lateral aspect of left thalamus measuring 6 mm. No other acute infarct present. Moderate generalized atrophy and diffuse white matter disease is present. Focal susceptibility is noted in the anterior right corona radiata. A focus of T2 hyperintensity with the surrounding hemosiderin rim is present in the same location. No acute hemorrhage is present. Other remote ischemic changes are present in the thalami bilaterally. White matter changes extend into the brainstem. A remote lacunar infarct is present the posterior left cerebellum. Vascular: Flow is present in the major intracranial arteries. Skull and upper cervical spine: Degenerative changes are present in the upper cervical spine. Sinuses/Orbits: The paranasal sinuses and mastoid air cells are clear. Bilateral lens replacements are noted. Globes and orbits are otherwise unremarkable. IMPRESSION: 1. 6 mm acute nonhemorrhagic infarct in the lateral aspect of the left thalamus. 2.  Moderate generalized atrophy and diffuse white matter disease likely reflects the sequela of chronic microvascular ischemia. 3. Remote lacunar infarct of the posterior left cerebellum. These results were called by telephone at the time of interpretation on 02/08/2022 at 3:06 pm to Dr. Oneal Deputy , who verbally acknowledged these results. Electronically Signed   By: San Morelle M.D.   On: 02/08/2022 15:09   CT HEAD CODE STROKE WO CONTRAST  Result Date: 02/08/2022 CLINICAL DATA:  Code stroke.  Numbness, location not specified EXAM: CT HEAD WITHOUT CONTRAST TECHNIQUE: Contiguous axial images were obtained from the base of the skull through the vertex without intravenous contrast. RADIATION DOSE REDUCTION: This exam was performed according to the departmental dose-optimization program which includes automated exposure control, adjustment of the mA and/or kV according to patient size and/or use of iterative reconstruction technique. COMPARISON:  None Available. FINDINGS: Brain: Infarct at the left thalamus, age indeterminate by imaging. Mild chronic small vessel ischemia in the cerebral white matter. No hemorrhage, hydrocephalus, or mass. Vascular: No hyperdense vessel or unexpected calcification. Skull: Normal. Negative for fracture or focal lesion. Sinuses/Orbits: No acute finding. Other: These results were communicated to Dr. Cheral Marker not scored without at 10:58 am on 02/08/2022 by text  page via the [facility] messaging system. ASPECTS [facility] Stroke Program Early CT Score) Localizing symptoms IMPRESSION: Age-indeterminate left thalamic lacunar infarct. Electronically Signed   By: Jorje Guild M.D.   On: 02/08/2022 11:00      Subjective: Patient seen and examined at the bedside this morning.  Hemodynamically stable for discharge  Discharge Exam: Vitals:   02/10/22 0214 02/10/22 0520  BP: 137/84 135/74  Pulse: 70 71  Resp: 16 16  Temp: 97.7 F (36.5 C) 98.3 F (36.8 C)  SpO2: 100% 99%    Vitals:   02/09/22 1726 02/09/22 2155 02/10/22 0214 02/10/22 0520  BP: (!) 142/92 (!) 152/89 137/84 135/74  Pulse: 83 77 70 71  Resp: '16 16 16 16  ' Temp: 98.6 F (37 C) 98.2 F (36.8 C) 97.7 F (36.5 C) 98.3 F (36.8 C)  TempSrc: Oral Oral Oral Oral  SpO2: 100% 100% 100% 99%  Weight:      Height:        General: Pt is alert, awake, not in acute distress Cardiovascular: RRR, S1/S2 +, no rubs, no gallops Respiratory: CTA bilaterally, no wheezing, no rhonchi Abdominal: Soft, NT, ND, bowel sounds + Extremities: no edema, no cyanosis    The results of significant diagnostics from this hospitalization (including imaging, microbiology, ancillary and laboratory) are listed below for reference.     Microbiology: Recent Results (from the past 240 hour(s))  Resp Panel by RT-PCR (Flu A&B, Covid) Anterior Nasal Swab     Status: None   Collection Time: 02/08/22 10:36 AM   Specimen: Anterior Nasal Swab  Result Value Ref Range Status   SARS Coronavirus 2 by RT PCR NEGATIVE NEGATIVE Final    Comment: (NOTE) SARS-CoV-2 target nucleic acids are NOT DETECTED.  The SARS-CoV-2 RNA is generally detectable in upper respiratory specimens during the acute phase of infection. The lowest concentration of SARS-CoV-2 viral copies this assay can detect is 138 copies/mL. A negative result does not preclude SARS-Cov-2 infection and should not be used as the sole basis for treatment or other patient management decisions. A negative result may occur with  improper specimen collection/handling, submission of specimen other than nasopharyngeal swab, presence of viral mutation(s) within the areas targeted by this assay, and inadequate number of viral copies(<138 copies/mL). A negative result must be combined with clinical observations, patient history, and epidemiological information. The expected result is Negative.  Fact Sheet for Patients:  EntrepreneurPulse.com.au  Fact Sheet  for Healthcare Providers:  IncredibleEmployment.be  This test is no t yet approved or cleared by the [place] FDA and  has been authorized for detection and/or diagnosis of SARS-CoV-2 by FDA under an Emergency Use Authorization (EUA). This EUA will remain  in effect (meaning this test can be used) for the duration of the COVID-19 declaration under Section 564(b)(1) of the Act, 21 U.S.C.section 360bbb-3(b)(1), unless the authorization is terminated  or revoked sooner.       Influenza A by PCR NEGATIVE NEGATIVE Final   Influenza B by PCR NEGATIVE NEGATIVE Final    Comment: (NOTE) The Xpert Xpress SARS-CoV-2/FLU/RSV plus assay is intended as an aid in the diagnosis of influenza from Nasopharyngeal swab specimens and should not be used as a sole basis for treatment. Nasal washings and aspirates are unacceptable for Xpert Xpress SARS-CoV-2/FLU/RSV testing.  Fact Sheet for Patients: EntrepreneurPulse.com.au  Fact Sheet for Healthcare Providers: IncredibleEmployment.be  This test is not yet approved or cleared by the [place] FDA and has been authorized for detection and/or diagnosis  of SARS-CoV-2 by FDA under an Emergency Use Authorization (EUA). This EUA will remain in effect (meaning this test can be used) for the duration of the COVID-19 declaration under Section 564(b)(1) of the Act, 21 U.S.C. section 360bbb-3(b)(1), unless the authorization is terminated or revoked.  Performed at [facility], [place] [address], [place], [place] [address]      Labs: BNP (last 3 results) No results for input(s): "BNP" in the last 8760 hours. Basic Metabolic Panel: Recent Labs  Lab 02/08/22 1045 02/08/22 1051 02/09/22 0511  NA 139 139 140  K 3.9 3.9 3.6  CL 106 102 109  CO2 26  --  24  GLUCOSE 106* 106* 95  BUN 29* 26* 24*  CREATININE 1.38* 1.40* 1.09*  CALCIUM 9.4  --  8.8*   Liver Function  Tests: Recent Labs  Lab 02/08/22 1045  AST 19  ALT 12  ALKPHOS 54  BILITOT 0.8  PROT 7.9  ALBUMIN 4.0   No results for input(s): "LIPASE", "AMYLASE" in the last 168 hours. No results for input(s): "AMMONIA" in the last 168 hours. CBC: Recent Labs  Lab 02/08/22 1045 02/08/22 1051  WBC 4.4  --   NEUTROABS 2.5  --   HGB 12.6 13.6  HCT 38.5 40.0  MCV 92.3  --   PLT 234  --    Cardiac Enzymes: No results for input(s): "CKTOTAL", "CKMB", "CKMBINDEX", "TROPONINI" in the last 168 hours. BNP: Invalid input(s): "POCBNP" CBG: Recent Labs  Lab 02/09/22 0755 02/09/22 1207 02/09/22 1607 02/09/22 2157 02/10/22 0721  GLUCAP 91 133* 119* 100* 105*   D-Dimer No results for input(s): "DDIMER" in the last 72 hours. Hgb A1c Recent Labs    02/09/22 0511  HGBA1C 5.9*   Lipid Profile Recent Labs    02/09/22 0511  CHOL 156  HDL 58  LDLCALC 90  TRIG 41  CHOLHDL 2.7   Thyroid function studies No results for input(s): "TSH", "T4TOTAL", "T3FREE", "THYROIDAB" in the last 72 hours.  Invalid input(s): "FREET3" Anemia work up No results for input(s): "VITAMINB12", "FOLATE", "FERRITIN", "TIBC", "IRON", "RETICCTPCT" in the last 72 hours. Urinalysis    Component Value Date/Time   COLORURINE STRAW (A) 02/08/2022 1931   APPEARANCEUR CLEAR 02/08/2022 1931   LABSPEC 1.010 02/08/2022 1931   PHURINE 6.0 02/08/2022 1931   GLUCOSEU NEGATIVE 02/08/2022 1931   HGBUR NEGATIVE 02/08/2022 1931   BILIRUBINUR NEGATIVE 02/08/2022 1931   BILIRUBINUR negative 01/13/2022 1657   KETONESUR 5 (A) 02/08/2022 1931   PROTEINUR 100 (A) 02/08/2022 1931   UROBILINOGEN 0.2 01/13/2022 1657   NITRITE NEGATIVE 02/08/2022 1931   LEUKOCYTESUR NEGATIVE 02/08/2022 1931   Sepsis Labs Recent Labs  Lab 02/08/22 1045  WBC 4.4   Microbiology Recent Results (from the past 240 hour(s))  Resp Panel by RT-PCR (Flu A&B, Covid) Anterior Nasal Swab     Status: None   Collection Time: 02/08/22 10:36 AM    Specimen: Anterior Nasal Swab  Result Value Ref Range Status   SARS Coronavirus 2 by RT PCR NEGATIVE NEGATIVE Final    Comment: (NOTE) SARS-CoV-2 target nucleic acids are NOT DETECTED.  The SARS-CoV-2 RNA is generally detectable in upper respiratory specimens during the acute phase of infection. The lowest concentration of SARS-CoV-2 viral copies this assay can detect is 138 copies/mL. A negative result does not preclude SARS-Cov-2 infection and should not be used as the sole basis for treatment or other patient management decisions. A negative result may occur with  improper specimen  collection/handling, submission of specimen other than nasopharyngeal swab, presence of viral mutation(s) within the areas targeted by this assay, and inadequate number of viral copies(<138 copies/mL). A negative result must be combined with clinical observations, patient history, and epidemiological information. The expected result is Negative.  Fact Sheet for Patients:  EntrepreneurPulse.com.au  Fact Sheet for Healthcare Providers:  IncredibleEmployment.be  This test is no t yet approved or cleared by the [place] FDA and  has been authorized for detection and/or diagnosis of SARS-CoV-2 by FDA under an Emergency Use Authorization (EUA). This EUA will remain  in effect (meaning this test can be used) for the duration of the COVID-19 declaration under Section 564(b)(1) of the Act, 21 U.S.C.section 360bbb-3(b)(1), unless the authorization is terminated  or revoked sooner.       Influenza A by PCR NEGATIVE NEGATIVE Final   Influenza B by PCR NEGATIVE NEGATIVE Final    Comment: (NOTE) The Xpert Xpress SARS-CoV-2/FLU/RSV plus assay is intended as an aid in the diagnosis of influenza from Nasopharyngeal swab specimens and should not be used as a sole basis for treatment. Nasal washings and aspirates are unacceptable for Xpert Xpress  SARS-CoV-2/FLU/RSV testing.  Fact Sheet for Patients: EntrepreneurPulse.com.au  Fact Sheet for Healthcare Providers: IncredibleEmployment.be  This test is not yet approved or cleared by the [place] FDA and has been authorized for detection and/or diagnosis of SARS-CoV-2 by FDA under an Emergency Use Authorization (EUA). This EUA will remain in effect (meaning this test can be used) for the duration of the COVID-19 declaration under Section 564(b)(1) of the Act, 21 U.S.C. section 360bbb-3(b)(1), unless the authorization is terminated or revoked.  Performed at [facility], [place] [address], [place], [place] [address]     Please note: You were cared for by a hospitalist during your hospital stay. Once you are discharged, your primary care physician will handle any further medical issues. Please note that NO REFILLS for any discharge medications will be authorized once you are discharged, as it is imperative that you return to your primary care physician (or establish a relationship with a primary care physician if you do not have one) for your post hospital discharge needs so that they can reassess your need for medications and monitor your lab values.    Time coordinating discharge: 40 minutes  SIGNED:   Shelly Coss, MD  [facility] 02/10/2022, 11:10 AM Pager [phone]  If 7PM-7AM, please contact night-coverage [email or web address] Password [email or web address]

## 2022-02-10 NOTE — Progress Notes (Signed)
Provided patient with AVS discharge instructions, patient had no further questions upon discharge. 

## 2022-02-10 NOTE — Progress Notes (Signed)
.   Transition of Care [facility]) Screening Note   Patient Details  Name: Shannon Mcconnell Date of Birth: Sep 06, 1931   Transition of Care [facility]) CM/SW Contact:    Illene Regulus, LCSW Phone Number: 02/10/2022, 10:04 AM    Transition of Care Department [facility]) has reviewed patient and no TOC needs have been identified at this time. We will continue to monitor patient advancement through interdisciplinary progression rounds. If new patient transition needs arise, please place a TOC consult.

## 2022-02-11 ENCOUNTER — Other Ambulatory Visit: Payer: Self-pay

## 2022-02-11 ENCOUNTER — Telehealth: Payer: Self-pay

## 2022-02-11 NOTE — Telephone Encounter (Signed)
Transition Care Management Follow-up Telephone Call Date of discharge and from where: 02/08/2022 [facility] How have you been since you were released from the hospital? Pt states feeling good. Any questions or concerns? No  Items Reviewed: Did the pt receive and understand the discharge instructions provided? Yes  Medications obtained and verified? Yes  Other? Yes  Any new allergies since your discharge? No  Dietary orders reviewed? Yes Do you have support at home? Yes   Home Care and Equipment/Supplies: Were home health services ordered? no If so, what is the name of the agency? N/a  Has the agency set up a time to come to the patient's home? no Were any new equipment or medical supplies ordered?  No What is the name of the medical supply agency? N/a Were you able to get the supplies/equipment? no Do you have any questions related to the use of the equipment or supplies? No  Functional Questionnaire: (I = Independent and D = Dependent) ADLs: i  Bathing/Dressing- i  Meal Prep- i  Eating- i  Maintaining continence- i  Transferring/Ambulation- i  Managing Meds- i  Follow up appointments reviewed:  PCP Hospital f/u appt confirmed? Yes  Scheduled to see robyn sanders on n/a @ n/a. [facility] f/u appt confirmed? No  Scheduled to see n/a on n/a @ n/a. Are transportation arrangements needed? No  If their condition worsens, is the pt aware to call PCP or go to the Emergency Dept.? Yes Was the patient provided with contact information for the PCP's office or ED? Yes Was to pt encouraged to call back with questions or concerns? Yes

## 2022-02-11 NOTE — Telephone Encounter (Signed)
Transition Care Management Unsuccessful Follow-up Telephone Call  Date of discharge and from where:  02/10/2022  Attempts:  1st Attempt  Reason for unsuccessful TCM follow-up call:  Left voice message

## 2022-02-13 ENCOUNTER — Ambulatory Visit: Payer: Medicare PPO | Admitting: Internal Medicine

## 2022-02-13 ENCOUNTER — Encounter: Payer: Self-pay | Admitting: Internal Medicine

## 2022-02-13 VITALS — BP 140/68 | HR 89 | Temp 98.1°F | Ht 64.0 in | Wt 143.6 lb

## 2022-02-13 DIAGNOSIS — E1122 Type 2 diabetes mellitus with diabetic chronic kidney disease: Secondary | ICD-10-CM

## 2022-02-13 DIAGNOSIS — N1832 Chronic kidney disease, stage 3b: Secondary | ICD-10-CM

## 2022-02-13 DIAGNOSIS — I639 Cerebral infarction, unspecified: Secondary | ICD-10-CM

## 2022-02-13 DIAGNOSIS — M25561 Pain in right knee: Secondary | ICD-10-CM | POA: Diagnosis not present

## 2022-02-13 DIAGNOSIS — I129 Hypertensive chronic kidney disease with stage 1 through stage 4 chronic kidney disease, or unspecified chronic kidney disease: Secondary | ICD-10-CM

## 2022-02-13 DIAGNOSIS — N1831 Chronic kidney disease, stage 3a: Secondary | ICD-10-CM

## 2022-02-13 DIAGNOSIS — Z79899 Other long term (current) drug therapy: Secondary | ICD-10-CM

## 2022-02-13 NOTE — Progress Notes (Signed)
Shannon Mcconnell,acting as a Neurosurgeon for Shannon Aliment, MD.,have documented all relevant documentation on the behalf of Shannon Aliment, MD,as directed by  Shannon Aliment, MD while in the presence of Shannon Aliment, MD.   Subjective:     Patient ID: Shannon Mcconnell , female    DOB: [birth year] , 88 y.o.   MRN: [id number]   Chief Complaint  Patient presents with   Follow-up    Hospital      HPI  Patient presents today for a hospital follow up.  She is accompanied by her daughter, Shannon Mcconnell today.  She was admitted on 02/08/22 and was discharged on 02/10/22. She presented to ER with complaint of numbness on the right side, tingling, mild headache.  No motor weakness or confusion.  On presentation ,she was hypertensive, code stroke was called in the emergency department.  CT head shows age-indeterminate left thalamic lacunar infarct.  MRI of the brain showed 6 mm acute nonhemorrhagic infarct in the lateral aspect of the left thalamus.  Neurology consulted, stroke work-up initiated and completed.  Medically stable for discharge home today.  Since discharged, she has decreased appetite. She has not had any recurrent sx of numbness.   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 Swallow whole., Disp: 30 tablet, Rfl: 1   atorvastatin  (LIPITOR) 80 MG tablet, Take 1 tablet (80 mg total) by mouth daily., Disp: 30 tablet, Rfl: 1   Blood Glucose Monitoring Suppl (TRUE METRIX METER) w/Device KIT, USE AS DIRECTED TO CHECK BLOOD SUGAR TWICE DAILY DX: E11.65, Disp: 1 kit, Rfl: 1   clopidogrel (PLAVIX) 75 MG tablet, Take 1 tablet (75 mg total) by mouth daily for 19 days., Disp: 19 tablet, Rfl: 0   glucose blood (RELION TRUE METRIX TEST STRIPS) test strip, USE AS DIRECTED TWICE DAILY dx: e11.9, Disp: 50 each, Rfl: 11   hydrochlorothiazide (MICROZIDE) 12.5 MG capsule, Take 1 capsule by mouth once daily, Disp: 90 capsule, Rfl: 0   Menthol, Topical Analgesic, (ICY HOT EX), Apply 1 Application topically daily as needed (knee pain)., Disp: , Rfl:    Multiple Vitamins-Minerals (ADULT ONE DAILY GUMMIES) CHEW, Chew 1 tablet by mouth every morning., Disp: , Rfl:    olmesartan (BENICAR) 40 MG tablet, Take 1 tablet by mouth once daily (Patient taking differently: Take 40 mg by mouth at bedtime.), Disp: 90 tablet, Rfl: 0   OVER THE COUNTER MEDICATION, Take 1 tablet by mouth every morning. "Laminine" - amino acid supplement, Disp: , Rfl:    Cholecalciferol (VITAMIN D3) 50 MCG (2000 UT) capsule, TAKE 1 CAPSULE BY MOUTH TWICE DAILY **DISCONTINUE  VITAMIN  D2  50,000  IU** (Patient not taking: Reported on 02/08/2022), Disp: 60 capsule, Rfl: 0  No Known Allergies   Review of Systems  Constitutional: Negative.   Respiratory: Negative.    Cardiovascular: Negative.   Gastrointestinal: Negative.   Neurological: Negative.   Psychiatric/Behavioral: Negative.       Today's Vitals   02/13/22 1627  BP: (!) 140/68  Pulse: 89  Temp: 98.1 F (36.7 C)  TempSrc: Oral  Weight: 143 lb 9.6 oz (65.1 kg)  Height: $Remove'5\' 4"'hVzTcnq$  (1.626 m)  PainSc: 10-Worst pain ever  PainLoc: Knee   Body mass index is 24.65 kg/m.  Wt Readings from Last 3 Encounters:  02/13/22 143 lb 9.6 oz (65.1 kg)  02/08/22 152 lb (68.9 kg)  01/28/22 143 lb 9.6 oz (65.1 kg)    Objective:   Physical Exam Vitals and nursing note reviewed.  Constitutional:      Appearance: Normal appearance.  HENT:     Head: Normocephalic and atraumatic.  Eyes:     Extraocular Movements: Extraocular movements intact.  Cardiovascular:     Rate and Rhythm: Normal rate and regular rhythm.     Heart sounds: Normal heart sounds.  Pulmonary:     Effort: Pulmonary effort is normal.     Breath sounds: Normal breath sounds.  Musculoskeletal:        General: Swelling present.     Cervical back: Normal range of motion.     Comments: R knee swelling  Skin:    General: Skin is warm.  Neurological:     General: No focal deficit present.     Mental Status: She is alert and oriented to person, place, and time.     Motor: No weakness.  Psychiatric:        Mood and Affect: Mood normal.        Behavior: Behavior normal.      Assessment And Plan:     1. Non-hemorrhagic cerebrovascular accident (CVA) ([place]) TCM PERFORMED. A MEMBER OF THE CLINICAL TEAM SPOKE WITH THE PATIENT UPON DISCHARGE. DISCHARGE SUMMARY WAS REVIEWED IN FULL DETAIL DURING THE VISIT. MEDS RECONCILED AND COMPARED TO DISCHARGE MEDS. MEDICATION LIST WAS UPDATED AND REVIEWED WITH THE PATIENT. GREATER THAN 50% FACE TO FACE TIME WAS SPENT IN COUNSELING AND COORDINATION OF CARE. ALL QUESTIONS WERE ANSWERED TO THE SATISFACTION OF THE PATIENT. She is encouraged to notify me ASAP and seek urgent medical care if her sx recur. Importance of med compliance was stressed to patient. Reminded that Plavix is only for 21 days. Encouraged to keep Neuro f/u appt.   2. Hypertensive nephropathy Comments: Chronic, fair control. Goal BP<130/80. She will c/w amlodipine, olmesartan and hctz.  3. Stage 3b chronic kidney disease ([place]) Comments: Daughter requests Renal eval, she was last seen by [facility] in 2022. Will place new referral. - Ambulatory referral to Nephrology - Referral to Nutrition and Diabetes Services  4. Type 2 diabetes mellitus with  stage 3a chronic kidney disease, without long-term current use of insulin (HCC) Comments: Chronic, well controlled with diet.  - Referral to Nutrition and Diabetes Services  5. Recurrent pain of right knee Comments: Chronic, likely due to OA. She plans to go to [facility] for further evaluation. May benefit from topical pain cream.   6. Drug therapy   Patient was given opportunity to ask questions. Patient verbalized understanding of the plan and was able to repeat key elements of the plan. All questions were answered to their satisfaction.   I, [name], MD, have reviewed all documentation for this visit. The documentation on 02/25/22 for  the exam, diagnosis, procedures, and orders are all accurate and complete.   IF YOU HAVE BEEN REFERRED TO A SPECIALIST, IT MAY TAKE 1-2 WEEKS TO SCHEDULE/PROCESS THE REFERRAL. IF YOU HAVE NOT HEARD FROM US/SPECIALIST IN TWO WEEKS, PLEASE GIVE [place] A CALL AT [phone].   THE PATIENT IS ENCOURAGED TO PRACTICE SOCIAL DISTANCING DUE TO THE COVID-19 PANDEMIC.

## 2022-02-13 NOTE — Patient Instructions (Signed)

## 2022-02-20 NOTE — Telephone Encounter (Signed)
Error

## 2022-02-27 ENCOUNTER — Telehealth: Payer: Self-pay | Admitting: *Deleted

## 2022-02-27 DIAGNOSIS — M25561 Pain in right knee: Secondary | ICD-10-CM | POA: Diagnosis not present

## 2022-02-27 DIAGNOSIS — M1711 Unilateral primary osteoarthritis, right knee: Secondary | ICD-10-CM | POA: Diagnosis not present

## 2022-02-27 NOTE — Chronic Care Management (AMB) (Signed)
  Care Coordination   Note   02/27/2022 Name: Shannon Mcconnell MRN: [id number] DOB: 1931-06-23  Shannon Mcconnell is a 88 y.o. year old female who sees Glendale Chard, MD for primary care. I reached out to Dwyane Luo by phone today to offer care coordination services.  Shannon Mcconnell was given information about Care Coordination services today including:   The Care Coordination services include support from the care team which includes your Nurse Coordinator, Clinical Social Worker, or Pharmacist.  The Care Coordination team is here to help remove barriers to the health concerns and goals most important to you. Care Coordination services are voluntary, and the patient may decline or stop services at any time by request to their care team member.   Care Coordination Consent Status: Patient agreed to services and verbal consent obtained.   Follow up plan:  Telephone appointment with care coordination team member scheduled for:  03/11/22  Encounter Outcome:  Pt. Scheduled  [place]  Direct Dial: [phone]

## 2022-02-28 ENCOUNTER — Encounter: Payer: Self-pay | Admitting: Dietician

## 2022-02-28 ENCOUNTER — Encounter: Payer: Medicare PPO | Attending: Internal Medicine | Admitting: Dietician

## 2022-02-28 DIAGNOSIS — N1831 Chronic kidney disease, stage 3a: Secondary | ICD-10-CM | POA: Diagnosis not present

## 2022-02-28 DIAGNOSIS — E1122 Type 2 diabetes mellitus with diabetic chronic kidney disease: Secondary | ICD-10-CM | POA: Insufficient documentation

## 2022-02-28 NOTE — Progress Notes (Signed)
  Medical Nutrition Therapy  Appointment Start time:  [phone]  Appointment End time:  1615 Patient is here today with her daughter.    Primary concerns today:  She would like to learn more about what to eat for her HTN, DM and kidneys. She is losing weight as she does not know what to eat and has been over restricting although she states that she eats when she is hungry and consistently scheduled meals.  Referral diagnosis: Type 2 Diabetes, CKD stage 3 Preferred learning style: no preference indicated Learning readiness: ready, change in progress)   NUTRITION ASSESSMENT   Anthropometrics  144 lbs 02/28/2022 154 lbs UBW recently   Clinical Medical Hx: Type 2  Diabetes, HTN, CKD stage 3, CVA 2 weeks ago, HLD, GERD Medications: see list to include atorvastatin, omesartan, Amlodipine, hydrochlorothiazide Labs:  BUN 24, Creatinine 1.09, Potassium 3.6, eGFR 49 02/09/2022, A1C 5.9% 01/13/2022, 100 mg protein in the urine 02/08/2022 Notable Signs/Symptoms: weight loss  Lifestyle & Dietary Hx Patient lives with her daughter and son.  She cooks for herself and her daughter frequently shops.  Patient does drive and occasionally shops for herself.  Estimated daily fluid intake: 96 oz Supplements: vitamin D, MVI  Sleep: good Stress / self-care: none Current average weekly physical activity: ADL's "bad knees"  24-Hr Dietary Recall Stopped using salt First Meal 9 am: grits, boiled (1-2) eggs, 1 slice [employer] toast (dry), Boost Snack: none Second Meal 12:30: cabbage, cornbread, chicken, squash and zucchini  OR leftovers OR salad with chicken with ranch dressing Snack: none Third Meal 8:30: OR oatmeal Snack: none Beverages: water and Boost (original vanilla)  Estimated Energy Needs Calories: 1800-2000 Protein: 65g  NUTRITION DIAGNOSIS  NB-1.1 Food and nutrition-related knowledge deficit As related to balance of carbohydrate, protein, and fat.  As evidenced by diet hx and patient  report.   NUTRITION INTERVENTION  Nutrition education (E-1) on the following topics:  Importance of not over restricting to avoid additional weight loss. Continue low sodium diet due to HTN and CKD Avoid foods with phos... containing foods on the ingredient list (patient eats a fairly unprocessed diet and does not eat these to her knowledge) Avoid salt substitutes that continue potassium Balance of nutrition with diabetes and CKD Recommended adding consistent carbohydrates to each meal  Discussed label reading for sodium and suggestions per meal Discussed continued supplementation Discussed benefits of 1 meatless meal per day Evaluated current intake which is adequate in protein but likely not calories Importance of continued adequate hydration  Handouts Provided Include  NKD national kidney diet - Dish up a Kidney-Friendly Meal for Patients with Chronic Kidney Disease (not on dialysis)  Learning Style & Readiness for Change Teaching method utilized: Visual & Auditory  Demonstrated degree of understanding via: Teach Back  Barriers to learning/adherence to lifestyle change: none  Goals Established by Pt Silver Palate Low Sodium Pasta sauce Green [employer] is low in sodium Continue to avoid added salt and high sodium foods Choose low sodium beans or rinse them. Add more starch for lunch to be sure you are getting enough calories to maintain your weight. Continue the boost Read labels for food eaten out and ask for foods to be prepared without salt when able. Aim for around 600 mg or less sodium per meal Continue the original vanilla Boost    MONITORING & EVALUATION Dietary intake, weekly physical activity, and label reading prn.  Next Steps  Patient is to call for quesitons.

## 2022-02-28 NOTE — Patient Instructions (Addendum)
Silver Palate Low Sodium Pasta sauce Green [employer] is low in sodium Continue to avoid added salt and high sodium foods Choose low sodium beans or rinse them. Add more starch for lunch to be sure you are getting enough calories to maintain your weight. Continue the boost Read labels for food eaten out and ask for foods to be prepared without salt when able. Aim for around 600 mg or less sodium per meal Continue the original vanilla Boost

## 2022-03-04 ENCOUNTER — Other Ambulatory Visit: Payer: Self-pay | Admitting: Internal Medicine

## 2022-03-04 ENCOUNTER — Ambulatory Visit (INDEPENDENT_AMBULATORY_CARE_PROVIDER_SITE_OTHER): Payer: Medicare PPO

## 2022-03-04 VITALS — BP 130/78 | HR 86 | Temp 98.1°F

## 2022-03-04 DIAGNOSIS — N1832 Chronic kidney disease, stage 3b: Secondary | ICD-10-CM | POA: Diagnosis not present

## 2022-03-04 DIAGNOSIS — Z23 Encounter for immunization: Secondary | ICD-10-CM | POA: Diagnosis not present

## 2022-03-04 DIAGNOSIS — I129 Hypertensive chronic kidney disease with stage 1 through stage 4 chronic kidney disease, or unspecified chronic kidney disease: Secondary | ICD-10-CM

## 2022-03-04 NOTE — Progress Notes (Signed)
Patient presents today for BPC.  She is currently taking amlodipine 10mg  hctz 12.5 and olmesartan 40mg . She did bring her personal cuff with her today and received a reading of 128/87.  BP Readings from Last 3 Encounters:  03/04/22 (!) 140/80  02/13/22 (!) 140/68  02/10/22 135/74   Patient would also like to get flu shot today.

## 2022-03-05 LAB — BMP8+EGFR
BUN/Creatinine Ratio: 22 (ref 12–28)
BUN: 32 mg/dL — ABNORMAL HIGH (ref 8–27)
CO2: 24 mmol/L (ref 20–29)
Calcium: 9.3 mg/dL (ref 8.7–10.3)
Chloride: 91 mmol/L — ABNORMAL LOW (ref 96–106)
Creatinine, Ser: 1.46 mg/dL — ABNORMAL HIGH (ref 0.57–1.00)
Glucose: 92 mg/dL (ref 70–99)
Potassium: 4.2 mmol/L (ref 3.5–5.2)
Sodium: 132 mmol/L — ABNORMAL LOW (ref 134–144)
eGFR: 34 mL/min/{1.73_m2} — ABNORMAL LOW (ref >59)

## 2022-03-05 LAB — MAGNESIUM: Magnesium: 2 mg/dL (ref 1.6–2.3)

## 2022-03-11 ENCOUNTER — Ambulatory Visit: Payer: Self-pay

## 2022-03-11 NOTE — Patient Instructions (Signed)
Visit Information  Thank you for taking time to visit with me today. Please don't hesitate to contact me if I can be of assistance to you.   Following are the goals we discussed today:   Goals Addressed               This Visit's Progress     Patient Stated     I am worried about my blood pressure (pt-stated)        Care Coordination Interventions: Evaluation of current treatment plan related to hypertension self management and patient's adherence to plan as established by provider Provided education to daughter about s/s suggestive of a stroke and discussed this is a medical emergency, call 911 Provided assistance with obtaining home blood pressure monitor through collaboration with PCP who will complete PA for Omron BP cuff through [facility] Provided verbal and written instruction on how to accurately measure BP at home Advised patient, providing education and rationale, to monitor blood pressure daily and record, calling PCP for findings outside established parameters Provided education on prescribed diet low Sodium           Our next appointment is by telephone on 04/14/22 at 1:00 PM   Please call the care guide team at [phone] if you need to cancel or reschedule your appointment.   If you are experiencing a Mental Health or [place] or need someone to talk to, please call 1-800-273-TALK (toll free, 24 hour hotline)  The patient verbalized understanding of instructions, educational materials, and care plan provided today and agreed to receive a mailed copy of patient instructions, educational materials, and care plan.   Barb Merino, RN, BSN, CCM Care Management Coordinator [place] [facility] Direct Phone: [phone]

## 2022-03-11 NOTE — Patient Outreach (Signed)
  Care Coordination   Initial Visit Note   03/11/2022 Name: Shannon Mcconnell MRN: [id number] DOB: [birth year]  Shannon Mcconnell is a 88 y.o. year old female who sees Glendale Chard, MD for primary care. I spoke with  Dwyane Luo by phone today.  What matters to the patients health and wellness today?  Patient is worried about her blood pressure. Patient and daughter are working on purchasing a new blood pressure cuff.     Goals Addressed               This Visit's Progress     Patient Stated     I am worried about my blood pressure (pt-stated)        Care Coordination Interventions: Evaluation of current treatment plan related to hypertension self management and patient's adherence to plan as established by provider Provided education to daughter about s/s suggestive of a stroke and discussed this is a medical emergency, call 911 Provided assistance with obtaining home blood pressure monitor through collaboration with PCP who will complete PA for Omron BP cuff through [facility] Provided verbal and written instruction on how to accurately measure BP at home Advised patient, providing education and rationale, to monitor blood pressure daily and record, calling PCP for findings outside established parameters Provided education on prescribed diet low Sodium        SDOH assessments and interventions completed:  Yes  SDOH Interventions Today    Flowsheet Row Most Recent Value  SDOH Interventions   Transportation Interventions Intervention Not Indicated        Care Coordination Interventions Activated:  Yes  Care Coordination Interventions:  Yes, provided   Follow up plan: Follow up call scheduled for 04/14/22 @1 :00 PM     Encounter Outcome:  Pt. Visit Completed

## 2022-03-13 DIAGNOSIS — M25561 Pain in right knee: Secondary | ICD-10-CM | POA: Diagnosis not present

## 2022-03-13 DIAGNOSIS — M1711 Unilateral primary osteoarthritis, right knee: Secondary | ICD-10-CM | POA: Diagnosis not present

## 2022-03-16 ENCOUNTER — Other Ambulatory Visit: Payer: Self-pay | Admitting: Internal Medicine

## 2022-03-18 ENCOUNTER — Encounter: Payer: Self-pay | Admitting: Diagnostic Neuroimaging

## 2022-03-18 ENCOUNTER — Ambulatory Visit: Payer: Medicare PPO | Admitting: Diagnostic Neuroimaging

## 2022-03-18 VITALS — BP 136/85 | HR 84 | Ht 65.0 in | Wt 143.2 lb

## 2022-03-18 DIAGNOSIS — I6381 Other cerebral infarction due to occlusion or stenosis of small artery: Secondary | ICD-10-CM

## 2022-03-18 NOTE — Progress Notes (Signed)
[facility]  PATIENT: Shannon Mcconnell DOB: 1931/07/22  REFERRING CLINICIAN: Cathlyn Parsons, PA-C HISTORY FROM: patient and daughter REASON FOR VISIT: new consult   HISTORICAL  CHIEF COMPLAINT:  Chief Complaint  Patient presents with   Cerebrovascular Accident    Rm [place], dgtr- [name]  "some numbness in my lips, no other concerns; no therapies were recommended"    HISTORY OF PRESENT ILLNESS:   88 year old female with diabetes, hypertension, hyperlipidemia, chronic kidney disease, here for evaluation of left thalamic ischemic infarction.  Patient went to hospital in August 2023 for right-sided numbness and tingling.  Stroke work-up was completed.  Since that time symptoms have essentially resolved.  Overall doing well.   REVIEW OF SYSTEMS: Full 14 system review of systems performed and negative with exception of: as per HPI.  ALLERGIES: No Known Allergies  HOME MEDICATIONS: Outpatient Medications Prior to Visit  Medication Sig Dispense Refill   amLODipine (NORVASC) 10 MG tablet Take 1 tablet by mouth once daily (Patient taking differently: Take 10 mg by mouth daily. Patient switched to taking dose in the pm per Dr. Baird Cancer) 90 tablet 0   aspirin EC 81 MG tablet Take 1 tablet (81 mg total) by mouth daily. Swallow whole. 30 tablet 1   atorvastatin (LIPITOR) 80 MG tablet Take 1 tablet (80 mg total) by mouth daily. (Patient taking differently: Take 80 mg by mouth daily. Patient is taking dose in the pm per Dr. Baird Cancer) 30 tablet 1   Blood Glucose Monitoring Suppl (TRUE METRIX METER) w/Device KIT USE AS DIRECTED TO CHECK BLOOD SUGAR TWICE DAILY DX: E11.65 1 kit 1   Cholecalciferol (VITAMIN D3) 50 MCG (2000 UT) capsule TAKE 1 CAPSULE BY MOUTH TWICE DAILY **DISCONTINUE  VITAMIN  D2  50,000  IU** 60 capsule 0   glucose blood (RELION TRUE METRIX TEST STRIPS) test strip USE AS DIRECTED TWICE DAILY dx: e11.9 50 each 11   hydrochlorothiazide (MICROZIDE) 12.5  MG capsule Take 1 capsule (12.5 mg total) by mouth daily. 90 capsule 1   Menthol, Topical Analgesic, (ICY HOT EX) Apply 1 Application topically daily as needed (knee pain).     Multiple Vitamins-Minerals (ADULT ONE DAILY GUMMIES) CHEW Chew 1 tablet by mouth every morning.     olmesartan (BENICAR) 40 MG tablet Take 1 tablet by mouth once daily (Patient taking differently: Take 40 mg by mouth daily.) 90 tablet 0   OVER THE COUNTER MEDICATION Take 1 tablet by mouth every morning. "Laminine" - amino acid supplement     No facility-administered medications prior to visit.    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   DM (diabetes mellitus) ([place])    Gastroesophageal reflux disease 02/08/2022   High cholesterol    Hypertension    Pure hypercholesterolemia 08/30/2018   Stroke ([place]) 01/2022    PAST SURGICAL HISTORY: Past Surgical History:  Procedure Laterality Date   ABDOMINAL HYSTERECTOMY     mastectomy Left     FAMILY HISTORY: Family History  Problem Relation Age of Onset   Hypertension Mother    Hypertension Father     SOCIAL HISTORY: Social History   Socioeconomic History   Marital status: Divorced    Spouse name: Not on file   Number of children: 8   Years of education: Not on file   Highest education level: 10th grade  Occupational History   Occupation: retired  Tobacco Use   Smoking status: Never  Smokeless tobacco: Never  Vaping Use   Vaping Use: Never used  Substance and Sexual Activity   Alcohol use: Never   Drug use: Never   Sexual activity: Not Currently  Other Topics Concern   Not on file  Social History Narrative   03/18/22 dgtr Lisa lives with her   Social Determinants of Health   Financial Resource Strain: Low Risk  (09/04/2021)   Overall Financial Resource Strain (CARDIA)    Difficulty of Paying Living Expenses: Not hard at all  Food Insecurity: No Food Insecurity (09/04/2021)    Hunger Vital Sign    Worried About Running Out of Food in the Last Year: Never true    Ran Out of Food in the Last Year: Never true  Transportation Needs: No Transportation Needs (03/11/2022)   PRAPARE - Transportation    Lack of Transportation (Medical): No    Lack of Transportation (Non-Medical): No  Physical Activity: Insufficiently Active (09/04/2021)   Exercise Vital Sign    Days of Exercise per Week: 5 days    Minutes of Exercise per Session: 20 min  Stress: No Stress Concern Present (09/04/2021)   [employer] - Occupational Stress Questionnaire    Feeling of Stress : Not at all  Social Connections: Not on file  Intimate Partner Violence: Not on file     PHYSICAL EXAM  GENERAL EXAM/CONSTITUTIONAL: Vitals:  Vitals:   03/18/22 1026  BP: 136/85  Pulse: 84  Weight: 143 lb 3.2 oz (65 kg)  Height: 5' 5" (1.651 m)   Body mass index is 23.83 kg/m. Wt Readings from Last 3 Encounters:  03/18/22 143 lb 3.2 oz (65 kg)  02/28/22 144 lb (65.3 kg)  02/13/22 143 lb 9.6 oz (65.1 kg)   Patient is in no distress; well developed, nourished and groomed; neck is supple  CARDIOVASCULAR: Examination of carotid arteries is normal; no carotid bruits Regular rate and rhythm, no murmurs Examination of peripheral vascular system by observation and palpation is normal  EYES: Ophthalmoscopic exam of optic discs and posterior segments is normal; no papilledema or hemorrhages No results found.  MUSCULOSKELETAL: Gait, strength, tone, movements noted in Neurologic exam below  NEUROLOGIC: MENTAL STATUS:     05/26/2018   12:12 PM  MMSE - Mini Mental State Exam  Orientation to Place 5  Attention/ Calculation 5   awake, alert, oriented to person, place and time recent and remote memory intact normal attention and concentration language fluent, comprehension intact, naming intact fund of knowledge appropriate  CRANIAL NERVE:  2nd - no papilledema on  fundoscopic exam 2nd, 3rd, 4th, 6th - pupils equal and reactive to light, visual fields full to confrontation, extraocular muscles intact, no nystagmus 5th - facial sensation symmetric 7th - facial strength symmetric 8th - hearing intact 9th - palate elevates symmetrically, uvula midline 11th - shoulder shrug symmetric 12th - tongue protrusion midline  MOTOR:  normal bulk and tone, full strength in the BUE, BLE  SENSORY:  normal and symmetric to light touch, temperature, vibration  COORDINATION:  finger-nose-finger, fine finger movements normal  REFLEXES:  deep tendon reflexes TRACE and symmetric  GAIT/STATION:  narrow based gait     DIAGNOSTIC DATA (LABS, IMAGING, TESTING) - I reviewed patient records, labs, notes, testing and imaging myself where available.  Lab Results  Component Value Date   WBC 4.4 02/08/2022   HGB 13.6 02/08/2022   HCT 40.0 02/08/2022   MCV 92.3 02/08/2022   PLT 234 02/08/2022        Component Value Date/Time   NA 132 (L) 03/04/2022 1601   K 4.2 03/04/2022 1601   CL 91 (L) 03/04/2022 1601   CO2 24 03/04/2022 1601   GLUCOSE 92 03/04/2022 1601   GLUCOSE 95 02/09/2022 0511   BUN 32 (H) 03/04/2022 1601   CREATININE 1.46 (H) 03/04/2022 1601   CALCIUM 9.3 03/04/2022 1601   PROT 7.9 02/08/2022 1045   PROT 7.7 01/13/2022 1221   ALBUMIN 4.0 02/08/2022 1045   ALBUMIN 4.2 01/13/2022 1221   AST 19 02/08/2022 1045   ALT 12 02/08/2022 1045   ALKPHOS 54 02/08/2022 1045   BILITOT 0.8 02/08/2022 1045   BILITOT 0.7 01/13/2022 1221   GFRNONAA 49 (L) 02/09/2022 0511   GFRAA 44 (L) 01/09/2020 1146   Lab Results  Component Value Date   CHOL 156 02/09/2022   HDL 58 02/09/2022   LDLCALC 90 02/09/2022   TRIG 41 02/09/2022   CHOLHDL 2.7 02/09/2022   Lab Results  Component Value Date   HGBA1C 5.9 (H) 02/09/2022   No results found for: "VITAMINB12" No results found for: "TSH"   02/08/22 MRI brain 1. 6 mm acute nonhemorrhagic infarct in the lateral  aspect of the left thalamus. 2. Moderate generalized atrophy and diffuse white matter disease likely reflects the sequela of chronic microvascular ischemia. 3. Remote lacunar infarct of the posterior left cerebellum.  02/08/22 Normal intracranial MRA.  02/09/22  Right Carotid: Velocities in the right ICA are consistent with a 1-39%  stenosis.                 The ECA appears <50% stenosed.   Left Carotid: Velocities in the left ICA are consistent with a 1-39%  stenosis.                The ECA appears <50% stenosed.   Vertebrals:  Bilateral vertebral arteries demonstrate antegrade flow.  Subclavians: Normal flow hemodynamics were seen in bilateral subclavian               arteries.   02/09/22 TTE - Normal biventricular function without  evidence of hemodynamically significant valvular heart disease. No  intracardiac source of embolism detected on this transthoracic study.   ASSESSMENT AND PLAN  88 y.o. year old female here with:   Dx:  1. Thalamic stroke (HCC)     PLAN:  Left thalamic stroke (small vessel thrombosis) - continue aspirin 81, statin, BP control - daily physical activity / exercise (at least 15-30 minutes) - eat more plants / vegetables - increase social activities, brain stimulation, games, puzzles, hobbies, crafts, arts, music - aim for at least 7-8 hours sleep per night (or more) - caution with medications, finances, driving  Return for pending if symptoms worsen or fail to improve, return to PCP.    Penni Bombard, MD 91/11/9448, 38:88 AM Certified in Neurology, Neurophysiology and Neuroimaging  [facility] [address], [place], [place] [address] [phone]

## 2022-03-18 NOTE — Patient Instructions (Addendum)
Left thalamic stroke (small vessel thrombosis) - continue aspirin 81, statin, BP control - daily physical activity / exercise (at least 15-30 minutes) - eat more plants / vegetables - increase social activities, brain stimulation, games, puzzles, hobbies, crafts, arts, music - aim for at least 7-8 hours sleep per night (or more) - caution with medications, finances, driving

## 2022-03-21 DIAGNOSIS — M1711 Unilateral primary osteoarthritis, right knee: Secondary | ICD-10-CM | POA: Diagnosis not present

## 2022-03-28 DIAGNOSIS — M1711 Unilateral primary osteoarthritis, right knee: Secondary | ICD-10-CM | POA: Diagnosis not present

## 2022-04-01 ENCOUNTER — Other Ambulatory Visit: Payer: Self-pay | Admitting: Internal Medicine

## 2022-04-10 ENCOUNTER — Ambulatory Visit (INDEPENDENT_AMBULATORY_CARE_PROVIDER_SITE_OTHER): Payer: Medicare PPO | Admitting: Internal Medicine

## 2022-04-10 ENCOUNTER — Encounter: Payer: Self-pay | Admitting: Internal Medicine

## 2022-04-10 VITALS — BP 130/68 | HR 96 | Temp 98.0°F | Ht 65.0 in | Wt 141.6 lb

## 2022-04-10 DIAGNOSIS — N1831 Chronic kidney disease, stage 3a: Secondary | ICD-10-CM

## 2022-04-10 DIAGNOSIS — I129 Hypertensive chronic kidney disease with stage 1 through stage 4 chronic kidney disease, or unspecified chronic kidney disease: Secondary | ICD-10-CM | POA: Diagnosis not present

## 2022-04-10 DIAGNOSIS — M1711 Unilateral primary osteoarthritis, right knee: Secondary | ICD-10-CM | POA: Diagnosis not present

## 2022-04-10 DIAGNOSIS — Z6823 Body mass index (BMI) 23.0-23.9, adult: Secondary | ICD-10-CM

## 2022-04-10 DIAGNOSIS — E1122 Type 2 diabetes mellitus with diabetic chronic kidney disease: Secondary | ICD-10-CM | POA: Diagnosis not present

## 2022-04-10 NOTE — Patient Instructions (Signed)
Hypertension, Adult ?Hypertension is another name for high blood pressure. High blood pressure forces your heart to work harder to pump blood. This can cause problems over time. ?There are two numbers in a blood pressure reading. There is a top number (systolic) over a bottom number (diastolic). It is best to have a blood pressure that is below 120/80. ?What are the causes? ?The cause of this condition is not known. Some other conditions can lead to high blood pressure. ?What increases the risk? ?Some lifestyle factors can make you more likely to develop high blood pressure: ?Smoking. ?Not getting enough exercise or physical activity. ?Being overweight. ?Having too much fat, sugar, calories, or salt (sodium) in your diet. ?Drinking too much alcohol. ?Other risk factors include: ?Having any of these conditions: ?Heart disease. ?Diabetes. ?High cholesterol. ?Kidney disease. ?Obstructive sleep apnea. ?Having a family history of high blood pressure and high cholesterol. ?Age. The risk increases with age. ?Stress. ?What are the signs or symptoms? ?High blood pressure may not cause symptoms. Very high blood pressure (hypertensive crisis) may cause: ?Headache. ?Fast or uneven heartbeats (palpitations). ?Shortness of breath. ?Nosebleed. ?Vomiting or feeling like you may vomit (nauseous). ?Changes in how you see. ?Very bad chest pain. ?Feeling dizzy. ?Seizures. ?How is this treated? ?This condition is treated by making healthy lifestyle changes, such as: ?Eating healthy foods. ?Exercising more. ?Drinking less alcohol. ?Your doctor may prescribe medicine if lifestyle changes do not help enough and if: ?Your top number is above 130. ?Your bottom number is above 80. ?Your personal target blood pressure may vary. ?Follow these instructions at home: ?Eating and drinking ? ?If told, follow the DASH eating plan. To follow this plan: ?Fill one half of your plate at each meal with fruits and vegetables. ?Fill one fourth of your plate  at each meal with whole grains. Whole grains include whole-wheat pasta, brown rice, and whole-grain bread. ?Eat or drink low-fat dairy products, such as skim milk or low-fat yogurt. ?Fill one fourth of your plate at each meal with low-fat (lean) proteins. Low-fat proteins include fish, chicken without skin, eggs, beans, and tofu. ?Avoid fatty meat, cured and processed meat, or chicken with skin. ?Avoid pre-made or processed food. ?Limit the amount of salt in your diet to less than 1,500 mg each day. ?Do not drink alcohol if: ?Your doctor tells you not to drink. ?You are pregnant, may be pregnant, or are planning to become pregnant. ?If you drink alcohol: ?Limit how much you have to: ?0-1 drink a day for women. ?0-2 drinks a day for men. ?Know how much alcohol is in your drink. In the [place] one drink equals one 12 oz bottle of beer (355 mL), one 5 oz glass of wine (148 mL), or one 1? oz glass of hard liquor (44 mL). ?Lifestyle ? ?Work with your doctor to stay at a healthy weight or to lose weight. Ask your doctor what the best weight is for you. ?Get at least 30 minutes of exercise that causes your heart to beat faster (aerobic exercise) most days of the week. This may include walking, swimming, or biking. ?Get at least 30 minutes of exercise that strengthens your muscles (resistance exercise) at least 3 days a week. This may include lifting weights or doing Pilates. ?Do not smoke or use any products that contain nicotine or tobacco. If you need help quitting, ask your doctor. ?Check your blood pressure at home as told by your doctor. ?Keep all follow-up visits. ?Medicines ?Take over-the-counter and prescription medicines   only as told by your doctor. Follow directions carefully. ?Do not skip doses of blood pressure medicine. The medicine does not work as well if you skip doses. Skipping doses also puts you at risk for problems. ?Ask your doctor about side effects or reactions to medicines that you should watch  for. ?Contact a doctor if: ?You think you are having a reaction to the medicine you are taking. ?You have headaches that keep coming back. ?You feel dizzy. ?You have swelling in your ankles. ?You have trouble with your vision. ?Get help right away if: ?You get a very bad headache. ?You start to feel mixed up (confused). ?You feel weak or numb. ?You feel faint. ?You have very bad pain in your: ?Chest. ?Belly (abdomen). ?You vomit more than once. ?You have trouble breathing. ?These symptoms may be an emergency. Get help right away. Call 911. ?Do not wait to see if the symptoms will go away. ?Do not drive yourself to the hospital. ?Summary ?Hypertension is another name for high blood pressure. ?High blood pressure forces your heart to work harder to pump blood. ?For most people, a normal blood pressure is less than 120/80. ?Making healthy choices can help lower blood pressure. If your blood pressure does not get lower with healthy choices, you may need to take medicine. ?This information is not intended to replace advice given to you by your health care provider. Make sure you discuss any questions you have with your health care provider. ?Document Revised: 03/21/2021 Document Reviewed: 03/21/2021 ?Elsevier Patient Education ? 2023 Elsevier Inc. ? ?

## 2022-04-10 NOTE — Progress Notes (Signed)
[name],acting as a Education administrator for [name], MD.,have documented all relevant documentation on the behalf of [name], MD,as directed by  [name], MD while in the presence of [name], MD.    Subjective:     Patient ID: Shannon Mcconnell , female    DOB: May 24, 1932 , 88 y.o.   MRN: [id number]   Chief Complaint  Patient presents with   Hypertension    HPI  Patient presents today for BP Check, she reports compliance with medications.   She is accompanied by her daughter today. Patient states her right knee is hurting again for about 3 days, patient has been evaluated at [facility] on 03/28/2022.  She received a series of 3 shots to help with pain, and "bone on bone"arthritis. Patient states it always hurts sitting, standing, and when sleeping.   BP Readings from Last 3 Encounters: 04/10/22 : 130/68 03/18/22 : 136/85 03/04/22 : 130/78    Hypertension This is a chronic problem. The current episode started more than 1 year ago. The problem has been gradually improving since onset. The problem is controlled. Risk factors for coronary artery disease include post-menopausal state. The current treatment provides moderate improvement. Hypertensive end-organ damage includes kidney disease and CVA.     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 Patient switched to taking dose in the pm per Dr. Baird Cancer, Disp: 90 tablet, Rfl: 1   aspirin EC 81 MG tablet, Take 1 tablet (81 mg total) by mouth daily. Swallow whole., Disp: 30 tablet,  Rfl: 1   atorvastatin (LIPITOR) 80 MG tablet, Take 1 tablet (80 mg total) by mouth daily. (Patient taking differently: Take 80 mg by mouth daily. Patient is taking dose in the pm per Dr. Baird Cancer), Disp: 30 tablet, Rfl: 1   Blood Glucose Monitoring Suppl (TRUE METRIX METER) w/Device KIT, USE AS DIRECTED TO CHECK BLOOD SUGAR TWICE DAILY DX: E11.65, Disp: 1 kit, Rfl: 1   Cholecalciferol (VITAMIN D3) 50 MCG (2000 UT) capsule, TAKE 1 CAPSULE BY MOUTH TWICE DAILY **DISCONTINUE  VITAMIN  D2  50,000  IU**, Disp: 60 capsule, Rfl: 0   glucose blood (RELION TRUE METRIX TEST STRIPS) test strip, USE AS DIRECTED TWICE DAILY dx: e11.9, Disp: 50 each, Rfl: 11   hydrochlorothiazide (MICROZIDE) 12.5 MG capsule, Take 1 capsule (12.5 mg total) by mouth daily., Disp: 90 capsule, Rfl: 1   Menthol, Topical Analgesic, (ICY HOT EX), Apply 1 Application topically daily as needed (knee pain)., Disp: , Rfl:    Multiple Vitamins-Minerals (ADULT ONE DAILY GUMMIES) CHEW, Chew 1 tablet by mouth every morning., Disp: , Rfl:    olmesartan (BENICAR) 40 MG tablet, Take 1 tablet by mouth once daily (Patient taking differently: Take 40 mg by mouth daily.), Disp: 90 tablet, Rfl: 0   OVER THE COUNTER MEDICATION, Take 1 tablet by mouth every morning. "Laminine" - amino acid supplement, Disp: , Rfl:    No Known Allergies   Review of Systems  Constitutional: Negative.  HENT: Negative.    Eyes: Negative.   Respiratory: Negative.    Cardiovascular: Negative.   Gastrointestinal: Negative.   Musculoskeletal:  Positive for arthralgias.     Today's Vitals   04/10/22 1207  BP: 130/68  Pulse: 96  Temp: 98 F (36.7 C)  TempSrc: Oral  Weight: 141 lb 9.6 oz (64.2 kg)  Height: '5\' 5"'  (1.651 m)  PainSc: 10-Worst pain ever  PainLoc: Knee   Body mass index is 23.56 kg/m.  Wt Readings from Last 3 Encounters:  04/10/22 141 lb 9.6 oz (64.2 kg)  03/18/22 143 lb 3.2 oz (65 kg)  02/28/22 144 lb (65.3 kg)    Objective:  Physical  Exam Vitals and nursing note reviewed.  Constitutional:      Appearance: Normal appearance.  HENT:     Head: Normocephalic and atraumatic.     Nose:     Comments: Masked     Mouth/Throat:     Comments: Masked  Eyes:     Extraocular Movements: Extraocular movements intact.  Cardiovascular:     Rate and Rhythm: Normal rate and regular rhythm.     Heart sounds: Normal heart sounds.  Pulmonary:     Effort: Pulmonary effort is normal.     Breath sounds: Normal breath sounds.  Musculoskeletal:        General: Swelling present.     Cervical back: Normal range of motion.  Skin:    General: Skin is warm.  Neurological:     General: No focal deficit present.     Mental Status: She is alert.  Psychiatric:        Mood and Affect: Mood normal.        Behavior: Behavior normal.      Assessment And Plan:     1. Hypertensive nephropathy Comments: Chronic, fair control. She will c/w current meds. She is encouraed to limit her sodium intake.  - CMP14+EGFR; Future  2. Type 2 diabetes mellitus with stage 3a chronic kidney disease, without long-term current use of insulin (HCC) Comments: She agrees to rto in four weeks for a lab visit. I will check labs as below.  - CMP14+EGFR; Future - Hemoglobin A1c; Future - Protein electrophoresis, serum; Future - Parathyroid Hormone, Intact w/Ca; Future  3. Primary osteoarthritis of right knee Comments: Chronic, she is now followed by Ortho. May benefit from steroid injection.   4. Body mass index (BMI) of 23.0-23.9 in adult Comments: We discussed several healthy snacks to incorporate into her diet.    Patient was given opportunity to ask questions. Patient verbalized understanding of the plan and was able to repeat key elements of the plan. All questions were answered to their satisfaction.   I, [name], MD, have reviewed all documentation for this visit. The documentation on 04/10/22 for the exam, diagnosis, procedures, and orders are  all accurate and complete.   IF YOU HAVE BEEN REFERRED TO A SPECIALIST, IT MAY TAKE 1-2 WEEKS TO SCHEDULE/PROCESS THE REFERRAL. IF YOU HAVE NOT HEARD FROM US/SPECIALIST IN TWO WEEKS, PLEASE GIVE [place] A CALL AT [phone].   THE PATIENT IS ENCOURAGED TO PRACTICE SOCIAL DISTANCING DUE TO THE COVID-19 PANDEMIC.

## 2022-04-14 ENCOUNTER — Ambulatory Visit: Payer: Self-pay

## 2022-04-14 DIAGNOSIS — M1711 Unilateral primary osteoarthritis, right knee: Secondary | ICD-10-CM | POA: Insufficient documentation

## 2022-04-14 NOTE — Patient Outreach (Signed)
  Care Coordination   Follow Up Visit Note   04/14/2022 Name: Devann Cribb MRN: [id number] DOB: 25-Jan-1932  Windie Marasco is a 88 y.o. year old female who sees Glendale Chard, MD for primary care. I spoke with daughter Norberta Keens by phone today.  What matters to the patients health and wellness today?  Patient's daughter will continue to help care for her mother and she will report new symptoms or concerns to patient's PCP promptly.     Goals Addressed               This Visit's Progress     Patient Stated     I am worried about my blood pressure (pt-stated)        Care Coordination Interventions: Evaluation of current treatment plan related to hypertension self management and patient's adherence to plan as established by provider Review of patient status, including review of consultant's reports, relevant laboratory and other test results, and medications completed Determined patient's BP is stable and within goal range Determined patient has not purchased a BP cuff for home use, collaborated with PCP regarding the need to complete a PA on line via [employer] Instructed daughter [name] to notify PCP of the instructions she was provided per [facility] in order for Dr. Baird Cancer to assist with the needed PA Instructed daughter [name] to notify PCP of new symptoms or concerns promptly  Reviewed and discussed next scheduled PCP office visit with Dr. Baird Cancer scheduled for 08/12/22 @9 :20 AM        SDOH assessments and interventions completed:  No     Care Coordination Interventions Activated:  Yes  Care Coordination Interventions:  Yes, provided   Follow up plan: Follow up call scheduled for 06/17/22 @10 :30 AM    Encounter Outcome:  Pt. Visit Completed

## 2022-04-14 NOTE — Patient Instructions (Signed)
Visit Information  Thank you for taking time to visit with me today. Please don't hesitate to contact me if I can be of assistance to you.   Following are the goals we discussed today:   Goals Addressed               This Visit's Progress     Patient Stated     I am worried about my blood pressure (pt-stated)        Care Coordination Interventions: Evaluation of current treatment plan related to hypertension self management and patient's adherence to plan as established by provider Review of patient status, including review of consultant's reports, relevant laboratory and other test results, and medications completed Determined patient's BP is stable and within goal range Determined patient has not purchased a BP cuff for home use, collaborated with PCP regarding the need to complete a PA on line via [employer] Instructed daughter [name] to notify PCP of the instructions she was provided per [facility] in order for Dr. Baird Cancer to assist with the needed PA Instructed daughter [name] to notify PCP of new symptoms or concerns promptly  Reviewed and discussed next scheduled PCP office visit with Dr. Baird Cancer scheduled for 08/12/22 @9 :20 AM           Our next appointment is by telephone on 06/17/22 at 10:30 AM  Please call the care guide team at [phone] if you need to cancel or reschedule your appointment.   If you are experiencing a Mental Health or [place] or need someone to talk to, please call 1-800-273-TALK (toll free, 24 hour hotline)  The patient verbalized understanding of instructions, educational materials, and care plan provided today and agreed to receive a mailed copy of patient instructions, educational materials, and care plan.   Barb Merino, RN, BSN, CCM Care Management Coordinator [facility] Care Management Direct Phone: [phone]

## 2022-04-15 ENCOUNTER — Ambulatory Visit: Payer: Medicare PPO

## 2022-04-15 ENCOUNTER — Telehealth: Payer: Self-pay

## 2022-04-15 ENCOUNTER — Other Ambulatory Visit: Payer: Self-pay

## 2022-04-15 MED ORDER — ASPIRIN 81 MG PO TBEC: 81.0000 mg | DELAYED_RELEASE_TABLET | Freq: Every day | ORAL | 1 refills | Status: DC

## 2022-04-15 MED ORDER — ATORVASTATIN CALCIUM 80 MG PO TABS: 80.0000 mg | ORAL_TABLET | Freq: Every day | ORAL | 1 refills | Status: DC

## 2022-04-15 NOTE — Telephone Encounter (Signed)
Patient's daughter notified that we have placed an order for a bp cuff through [employer] for pt. She should receive a call regarding a shipment date. YL,RMA

## 2022-04-28 ENCOUNTER — Ambulatory Visit: Payer: Medicare PPO | Admitting: Dietician

## 2022-05-06 DIAGNOSIS — H40033 Anatomical narrow angle, bilateral: Secondary | ICD-10-CM | POA: Diagnosis not present

## 2022-05-06 DIAGNOSIS — E119 Type 2 diabetes mellitus without complications: Secondary | ICD-10-CM | POA: Diagnosis not present

## 2022-05-06 LAB — HM DIABETES EYE EXAM

## 2022-05-07 ENCOUNTER — Other Ambulatory Visit: Payer: Medicare PPO

## 2022-05-07 DIAGNOSIS — I129 Hypertensive chronic kidney disease with stage 1 through stage 4 chronic kidney disease, or unspecified chronic kidney disease: Secondary | ICD-10-CM

## 2022-05-07 DIAGNOSIS — N1831 Chronic kidney disease, stage 3a: Secondary | ICD-10-CM | POA: Diagnosis not present

## 2022-05-07 DIAGNOSIS — E1122 Type 2 diabetes mellitus with diabetic chronic kidney disease: Secondary | ICD-10-CM

## 2022-05-13 LAB — CMP14+EGFR
ALT: 12 IU/L (ref 0–32)
AST: 20 IU/L (ref 0–40)
Albumin/Globulin Ratio: 1.6 (ref 1.2–2.2)
Albumin: 4.2 g/dL (ref 3.7–4.7)
Alkaline Phosphatase: 60 IU/L (ref 44–121)
BUN/Creatinine Ratio: 26 (ref 12–28)
BUN: 40 mg/dL — ABNORMAL HIGH (ref 8–27)
Bilirubin Total: 0.6 mg/dL (ref 0.0–1.2)
CO2: 25 mmol/L (ref 20–29)
Calcium: 9.5 mg/dL (ref 8.7–10.3)
Chloride: 95 mmol/L — ABNORMAL LOW (ref 96–106)
Creatinine, Ser: 1.53 mg/dL — ABNORMAL HIGH (ref 0.57–1.00)
Globulin, Total: 2.7 g/dL (ref 1.5–4.5)
Glucose: 101 mg/dL — ABNORMAL HIGH (ref 70–99)
Potassium: 4 mmol/L (ref 3.5–5.2)
Sodium: 136 mmol/L (ref 134–144)
Total Protein: 6.9 g/dL (ref 6.0–8.5)
eGFR: 33 mL/min/{1.73_m2} — ABNORMAL LOW (ref >59)

## 2022-05-13 LAB — PROTEIN ELECTROPHORESIS, SERUM
A/G Ratio: 1.2 (ref 0.7–1.7)
Albumin ELP: 3.8 g/dL (ref 2.9–4.4)
Alpha 1: 0.2 g/dL (ref 0.0–0.4)
Alpha 2: 0.7 g/dL (ref 0.4–1.0)
Beta: 1.2 g/dL (ref 0.7–1.3)
Gamma Globulin: 1.1 g/dL (ref 0.4–1.8)
Globulin, Total: 3.1 g/dL (ref 2.2–3.9)

## 2022-05-13 LAB — HEMOGLOBIN A1C
Est. average glucose Bld gHb Est-mCnc: 128 mg/dL
Hgb A1c MFr Bld: 6.1 % — ABNORMAL HIGH (ref 4.8–5.6)

## 2022-05-13 LAB — PTH, INTACT AND CALCIUM: PTH: 48 pg/mL (ref 15–65)

## 2022-05-15 ENCOUNTER — Other Ambulatory Visit: Payer: Self-pay

## 2022-05-15 MED ORDER — ATORVASTATIN CALCIUM 80 MG PO TABS: 80.0000 mg | ORAL_TABLET | Freq: Every day | ORAL | 1 refills | Status: DC

## 2022-06-02 ENCOUNTER — Encounter: Payer: Self-pay | Admitting: Internal Medicine

## 2022-06-17 ENCOUNTER — Ambulatory Visit: Payer: Self-pay

## 2022-06-17 ENCOUNTER — Other Ambulatory Visit: Payer: Self-pay

## 2022-06-17 MED ORDER — ATORVASTATIN CALCIUM 80 MG PO TABS: 80.0000 mg | ORAL_TABLET | Freq: Every day | ORAL | 1 refills | Status: DC

## 2022-06-17 NOTE — Patient Outreach (Signed)
  Care Coordination   06/17/2022 Name: Patrena Santalucia MRN: [id number] DOB: August 13, 1931   Care Coordination Outreach Attempts:  An unsuccessful telephone outreach was attempted for a scheduled appointment today.  Follow Up Plan:  Additional outreach attempts will be made to offer the patient care coordination information and services.   Encounter Outcome:  No Answer   Care Coordination Interventions:  No, not indicated    Barb Merino, RN, BSN, CCM Care Management Coordinator [facility] Care Management  Direct Phone: [phone]

## 2022-07-01 ENCOUNTER — Other Ambulatory Visit: Payer: Self-pay | Admitting: Internal Medicine

## 2022-07-16 ENCOUNTER — Ambulatory Visit: Payer: Medicare PPO | Admitting: Internal Medicine

## 2022-08-12 ENCOUNTER — Other Ambulatory Visit: Payer: Self-pay | Admitting: Internal Medicine

## 2022-08-12 ENCOUNTER — Encounter: Payer: Self-pay | Admitting: Internal Medicine

## 2022-08-12 ENCOUNTER — Ambulatory Visit (INDEPENDENT_AMBULATORY_CARE_PROVIDER_SITE_OTHER): Payer: Medicare PPO | Admitting: Internal Medicine

## 2022-08-12 VITALS — BP 140/74 | HR 84 | Temp 97.9°F | Ht 65.0 in | Wt 141.4 lb

## 2022-08-12 DIAGNOSIS — E7800 Pure hypercholesterolemia, unspecified: Secondary | ICD-10-CM

## 2022-08-12 DIAGNOSIS — E1122 Type 2 diabetes mellitus with diabetic chronic kidney disease: Secondary | ICD-10-CM

## 2022-08-12 DIAGNOSIS — M1711 Unilateral primary osteoarthritis, right knee: Secondary | ICD-10-CM | POA: Diagnosis not present

## 2022-08-12 DIAGNOSIS — N1831 Chronic kidney disease, stage 3a: Secondary | ICD-10-CM

## 2022-08-12 DIAGNOSIS — I129 Hypertensive chronic kidney disease with stage 1 through stage 4 chronic kidney disease, or unspecified chronic kidney disease: Secondary | ICD-10-CM

## 2022-08-12 DIAGNOSIS — K5909 Other constipation: Secondary | ICD-10-CM

## 2022-08-12 NOTE — Progress Notes (Unsigned)
I,Victoria T Hamilton,acting as a scribe for [name], MD.,have documented all relevant documentation on the behalf of [name], MD,as directed by  [name], MD while in the presence of [name], MD.    Subjective:     Patient ID: Shannon Mcconnell , female    DOB: 1931/09/13 , 89 y.o.   MRN: [id number]   Chief Complaint  Patient presents with   Hypertension   Diabetes    HPI  Patient presents today for BP & DM Check, she reports compliance with medications. She is accompanied by her daughter today.  She denies headache, dizziness, chest pain, SOB. She adds, having trouble with her bowels. She is using Dulcolax at home. Sx started two weeks ago. Which does not seem to help.  Lat BM: this morning. It was firm.   Hypertension This is a chronic problem. The current episode started more than 1 year ago. The problem has been gradually improving since onset. The problem is controlled. Risk factors for coronary artery disease include post-menopausal state. The current treatment provides moderate improvement. Hypertensive end-organ damage includes kidney disease and CVA.  Diabetes She presents for her follow-up diabetic visit. She has type 2 diabetes mellitus. Her disease course has been stable. There are no hypoglycemic associated symptoms. There are no diabetic associated symptoms. There are no hypoglycemic complications. Diabetic complications include a CVA. Risk factors for coronary artery disease include diabetes mellitus, dyslipidemia and post-menopausal. She is following a diabetic diet. An ACE inhibitor/angiotensin II receptor blocker is being taken. Eye exam is current.     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 Patient switched to taking dose in the pm per Dr. Baird Cancer, Disp: 90 tablet, Rfl: 1   aspirin EC 81 MG tablet, Take 1 tablet (81 mg total) by mouth daily. Swallow whole., Disp: 90 tablet, Rfl: 1   atorvastatin (LIPITOR) 80 MG tablet, Take 1 tablet (80 mg total) by mouth daily., Disp: 90 tablet, Rfl: 1   Blood Glucose Monitoring Suppl (TRUE METRIX METER) w/Device KIT, USE AS DIRECTED TO CHECK BLOOD SUGAR TWICE DAILY DX: E11.65, Disp: 1 kit, Rfl: 1   Cholecalciferol (VITAMIN D3) 50 MCG (2000 UT) capsule, TAKE 1 CAPSULE BY MOUTH TWICE DAILY **DISCONTINUE  VITAMIN  D2  50,000  IU**, Disp: 60 capsule, Rfl: 0   glucose blood (RELION TRUE METRIX TEST STRIPS) test strip, USE AS DIRECTED TWICE DAILY dx: e11.9, Disp: 50 each, Rfl: 11   hydrochlorothiazide (MICROZIDE) 12.5 MG capsule, Take 1 capsule (12.5 mg total) by mouth daily., Disp: 90 capsule, Rfl: 1   Menthol, Topical Analgesic, (ICY HOT EX), Apply 1 Application topically daily as needed (knee pain)., Disp: , Rfl:    Multiple Vitamins-Minerals (ADULT ONE DAILY GUMMIES) CHEW, Chew 1 tablet by mouth every morning., Disp: , Rfl:    olmesartan (BENICAR) 40 MG tablet, Take 1 tablet (40 mg total) by mouth daily., Disp: 90 tablet, Rfl: 1   OVER THE COUNTER MEDICATION, Take 1 tablet by  mouth every morning. "Laminine" - amino acid supplement, Disp: , Rfl:    No Known Allergies   Review of Systems  Constitutional: Negative.   Respiratory: Negative.    Cardiovascular: Negative.   Gastrointestinal:  Positive for constipation.  Neurological: Negative.   Psychiatric/Behavioral: Negative.       Today's Vitals   08/12/22 0932 08/12/22 0945  BP: (!) 138/90 (!) 140/74  Pulse: 84   Temp: 97.9 F (36.6 C)   SpO2: 98%   Weight: 141 lb 6.4 oz (64.1 kg)   Height:  5' 5"$  (1.651 m)    Body mass index is 23.53 kg/m.  Wt Readings from Last 3 Encounters:  08/12/22 141 lb 6.4 oz (64.1 kg)  04/10/22 141 lb 9.6 oz (64.2 kg)  03/18/22 143 lb 3.2 oz (65 kg)    Objective:  Physical Exam Vitals and nursing note reviewed.  Constitutional:      Appearance: Normal appearance.  HENT:     Head: Normocephalic and atraumatic.     Nose:     Comments: Masked     Mouth/Throat:     Comments: Masked  Eyes:     Extraocular Movements: Extraocular movements intact.  Cardiovascular:     Rate and Rhythm: Normal rate and regular rhythm.     Heart sounds: Normal heart sounds.  Pulmonary:     Effort: Pulmonary effort is normal.     Breath sounds: Normal breath sounds.  Musculoskeletal:     Cervical back: Normal range of motion.  Skin:    General: Skin is warm.  Neurological:     General: No focal deficit present.     Mental Status: She is alert.  Psychiatric:        Mood and Affect: Mood normal.        Behavior: Behavior normal.         Assessment And Plan:     1. Hypertensive nephropathy Comments: Chronic, uncontrolled. She will c/w amlodipine 41m, olmesartan 443mand hctz 12.43m10maily. Encouraged to follow low sodium diet. - CMP14+EGFR - Hemoglobin A1c - Lipid panel  2. Type 2 diabetes mellitus with stage 3a chronic kidney disease, without long-term current use of insulin (HCC) Comments: Chronic, no longer requires meds. Has not had recent Renal f/u since Dr. WebJustin Mendft the practice. I will check labs as below and refer to Renal. - CMP14+EGFR - Hemoglobin A1c - Lipid panel - PTH, intact and calcium - Phosphorus - Protein electrophoresis, serum - CBC no Diff - Ambulatory referral to Nephrology  3. Other constipation Comments: Advised to try Miralax +/- Dulcolax suppository. If sx persist, she will try Linzess 25m51mily. She is encouraged to try prune/apple juices as well.  4. Pure hypercholesterolemia Comments: Chronic, LDL goal < 70. She  will c/w atorvastatin 80mg33mly. She is encouraged to follow heart healthy diet. - TSH  5. Primary osteoarthritis of right knee Comments: Chronic, unfortunately injections were not helpful. Encouraged to try topical pain cream.     Patient was given opportunity to ask questions. Patient verbalized understanding of the plan and was able to repeat key elements of the plan. All questions were answered to their satisfaction.   I, RobynMaximino Greenland have reviewed all documentation for this visit. The documentation on 08/12/22 for the exam, diagnosis, procedures, and orders are all accurate and complete.   IF YOU HAVE BEEN REFERRED TO A SPECIALIST, IT MAY TAKE 1-2 WEEKS TO SCHEDULE/PROCESS THE REFERRAL. IF YOU HAVE NOT HEARD FROM US/SPECIALIST  IN TWO WEEKS, PLEASE GIVE [place] A CALL AT [phone].   THE PATIENT IS ENCOURAGED TO PRACTICE SOCIAL DISTANCING DUE TO THE COVID-19 PANDEMIC.

## 2022-08-12 NOTE — Patient Instructions (Signed)
Hypertension, Adult ?Hypertension is another name for high blood pressure. High blood pressure forces your heart to work harder to pump blood. This can cause problems over time. ?There are two numbers in a blood pressure reading. There is a top number (systolic) over a bottom number (diastolic). It is best to have a blood pressure that is below 120/80. ?What are the causes? ?The cause of this condition is not known. Some other conditions can lead to high blood pressure. ?What increases the risk? ?Some lifestyle factors can make you more likely to develop high blood pressure: ?Smoking. ?Not getting enough exercise or physical activity. ?Being overweight. ?Having too much fat, sugar, calories, or salt (sodium) in your diet. ?Drinking too much alcohol. ?Other risk factors include: ?Having any of these conditions: ?Heart disease. ?Diabetes. ?High cholesterol. ?Kidney disease. ?Obstructive sleep apnea. ?Having a family history of high blood pressure and high cholesterol. ?Age. The risk increases with age. ?Stress. ?What are the signs or symptoms? ?High blood pressure may not cause symptoms. Very high blood pressure (hypertensive crisis) may cause: ?Headache. ?Fast or uneven heartbeats (palpitations). ?Shortness of breath. ?Nosebleed. ?Vomiting or feeling like you may vomit (nauseous). ?Changes in how you see. ?Very bad chest pain. ?Feeling dizzy. ?Seizures. ?How is this treated? ?This condition is treated by making healthy lifestyle changes, such as: ?Eating healthy foods. ?Exercising more. ?Drinking less alcohol. ?Your doctor may prescribe medicine if lifestyle changes do not help enough and if: ?Your top number is above 130. ?Your bottom number is above 80. ?Your personal target blood pressure may vary. ?Follow these instructions at home: ?Eating and drinking ? ?If told, follow the DASH eating plan. To follow this plan: ?Fill one half of your plate at each meal with fruits and vegetables. ?Fill one fourth of your plate  at each meal with whole grains. Whole grains include whole-wheat pasta, brown rice, and whole-grain bread. ?Eat or drink low-fat dairy products, such as skim milk or low-fat yogurt. ?Fill one fourth of your plate at each meal with low-fat (lean) proteins. Low-fat proteins include fish, chicken without skin, eggs, beans, and tofu. ?Avoid fatty meat, cured and processed meat, or chicken with skin. ?Avoid pre-made or processed food. ?Limit the amount of salt in your diet to less than 1,500 mg each day. ?Do not drink alcohol if: ?Your doctor tells you not to drink. ?You are pregnant, may be pregnant, or are planning to become pregnant. ?If you drink alcohol: ?Limit how much you have to: ?0-1 drink a day for women. ?0-2 drinks a day for men. ?Know how much alcohol is in your drink. In the [place] one drink equals one 12 oz bottle of beer (355 mL), one 5 oz glass of wine (148 mL), or one 1? oz glass of hard liquor (44 mL). ?Lifestyle ? ?Work with your doctor to stay at a healthy weight or to lose weight. Ask your doctor what the best weight is for you. ?Get at least 30 minutes of exercise that causes your heart to beat faster (aerobic exercise) most days of the week. This may include walking, swimming, or biking. ?Get at least 30 minutes of exercise that strengthens your muscles (resistance exercise) at least 3 days a week. This may include lifting weights or doing Pilates. ?Do not smoke or use any products that contain nicotine or tobacco. If you need help quitting, ask your doctor. ?Check your blood pressure at home as told by your doctor. ?Keep all follow-up visits. ?Medicines ?Take over-the-counter and prescription medicines   only as told by your doctor. Follow directions carefully. ?Do not skip doses of blood pressure medicine. The medicine does not work as well if you skip doses. Skipping doses also puts you at risk for problems. ?Ask your doctor about side effects or reactions to medicines that you should watch  for. ?Contact a doctor if: ?You think you are having a reaction to the medicine you are taking. ?You have headaches that keep coming back. ?You feel dizzy. ?You have swelling in your ankles. ?You have trouble with your vision. ?Get help right away if: ?You get a very bad headache. ?You start to feel mixed up (confused). ?You feel weak or numb. ?You feel faint. ?You have very bad pain in your: ?Chest. ?Belly (abdomen). ?You vomit more than once. ?You have trouble breathing. ?These symptoms may be an emergency. Get help right away. Call 911. ?Do not wait to see if the symptoms will go away. ?Do not drive yourself to the hospital. ?Summary ?Hypertension is another name for high blood pressure. ?High blood pressure forces your heart to work harder to pump blood. ?For most people, a normal blood pressure is less than 120/80. ?Making healthy choices can help lower blood pressure. If your blood pressure does not get lower with healthy choices, you may need to take medicine. ?This information is not intended to replace advice given to you by your health care provider. Make sure you discuss any questions you have with your health care provider. ?Document Revised: 03/21/2021 Document Reviewed: 03/21/2021 ?Elsevier Patient Education ? 2023 Elsevier Inc. ? ?

## 2022-08-15 LAB — CBC
Hematocrit: 34.6 % (ref 34.0–46.6)
Hemoglobin: 11.5 g/dL (ref 11.1–15.9)
MCH: 29.6 pg (ref 26.6–33.0)
MCHC: 33.2 g/dL (ref 31.5–35.7)
MCV: 89 fL (ref 79–97)
Platelets: 264 10*3/uL (ref 150–450)
RBC: 3.88 x10E6/uL (ref 3.77–5.28)
RDW: 12.6 % (ref 11.7–15.4)
WBC: 4.4 10*3/uL (ref 3.4–10.8)

## 2022-08-15 LAB — PTH, INTACT AND CALCIUM: PTH: 58 pg/mL (ref 15–65)

## 2022-08-15 LAB — TSH: TSH: 3.21 u[IU]/mL (ref 0.450–4.500)

## 2022-08-15 LAB — LIPID PANEL
Chol/HDL Ratio: 2.3 ratio (ref 0.0–4.4)
Cholesterol, Total: 146 mg/dL (ref 100–199)
HDL: 64 mg/dL (ref >39)
LDL Chol Calc (NIH): 70 mg/dL (ref 0–99)
Triglycerides: 57 mg/dL (ref 0–149)
VLDL Cholesterol Cal: 12 mg/dL (ref 5–40)

## 2022-08-15 LAB — CMP14+EGFR
ALT: 13 IU/L (ref 0–32)
AST: 18 IU/L (ref 0–40)
Albumin/Globulin Ratio: 1.3 (ref 1.2–2.2)
Albumin: 4.1 g/dL (ref 3.7–4.7)
Alkaline Phosphatase: 58 IU/L (ref 44–121)
BUN/Creatinine Ratio: 21 (ref 12–28)
BUN: 34 mg/dL — ABNORMAL HIGH (ref 8–27)
Bilirubin Total: 0.6 mg/dL (ref 0.0–1.2)
CO2: 24 mmol/L (ref 20–29)
Calcium: 9.6 mg/dL (ref 8.7–10.3)
Chloride: 95 mmol/L — ABNORMAL LOW (ref 96–106)
Creatinine, Ser: 1.59 mg/dL — ABNORMAL HIGH (ref 0.57–1.00)
Globulin, Total: 3.1 g/dL (ref 1.5–4.5)
Glucose: 111 mg/dL — ABNORMAL HIGH (ref 70–99)
Potassium: 3.9 mmol/L (ref 3.5–5.2)
Sodium: 134 mmol/L (ref 134–144)
Total Protein: 7.2 g/dL (ref 6.0–8.5)
eGFR: 31 mL/min/{1.73_m2} — ABNORMAL LOW (ref >59)

## 2022-08-15 LAB — PROTEIN ELECTROPHORESIS, SERUM
A/G Ratio: 1.1 (ref 0.7–1.7)
Albumin ELP: 3.8 g/dL (ref 2.9–4.4)
Alpha 1: 0.2 g/dL (ref 0.0–0.4)
Alpha 2: 0.7 g/dL (ref 0.4–1.0)
Beta: 1.2 g/dL (ref 0.7–1.3)
Gamma Globulin: 1.2 g/dL (ref 0.4–1.8)
Globulin, Total: 3.4 g/dL (ref 2.2–3.9)

## 2022-08-15 LAB — HEMOGLOBIN A1C
Est. average glucose Bld gHb Est-mCnc: 128 mg/dL
Hgb A1c MFr Bld: 6.1 % — ABNORMAL HIGH (ref 4.8–5.6)

## 2022-08-15 LAB — PHOSPHORUS: Phosphorus: 4.1 mg/dL (ref 3.0–4.3)

## 2022-08-20 ENCOUNTER — Telehealth (INDEPENDENT_AMBULATORY_CARE_PROVIDER_SITE_OTHER): Payer: Medicare PPO | Admitting: Internal Medicine

## 2022-08-20 DIAGNOSIS — N1832 Chronic kidney disease, stage 3b: Secondary | ICD-10-CM

## 2022-08-20 DIAGNOSIS — U071 COVID-19: Secondary | ICD-10-CM | POA: Diagnosis not present

## 2022-08-20 MED ORDER — NIRMATRELVIR/RITONAVIR (PAXLOVID) TABLET (RENAL DOSING): 2.0000 | ORAL_TABLET | Freq: Two times a day (BID) | ORAL | 0 refills | Status: AC

## 2022-08-20 MED ORDER — BENZONATATE 100 MG PO CAPS: 100.0000 mg | ORAL_CAPSULE | Freq: Three times a day (TID) | ORAL | 1 refills | Status: AC | PRN

## 2022-08-20 NOTE — Progress Notes (Signed)
Virtual Visit via Video   This visit type was conducted due to national recommendations for restrictions regarding the COVID-19 Pandemic (e.g. social distancing) in an effort to limit this patient's exposure and mitigate transmission in our community.  Due to her co-morbid illnesses, this patient is at least at moderate risk for complications without adequate follow up.  This format is felt to be most appropriate for this patient at this time.  All issues noted in this document were discussed and addressed.  A limited physical exam was performed with this format.    This visit type was conducted due to national recommendations for restrictions regarding the COVID-19 Pandemic (e.g. social distancing) in an effort to limit this patient's exposure and mitigate transmission in our community.  Patients identity confirmed using two different identifiers.  This format is felt to be most appropriate for this patient at this time.  All issues noted in this document were discussed and addressed.  No physical exam was performed (except for noted visual exam findings with Video Visits).    Date:  08/27/2022   ID:  Shannon Mcconnell, DOB 1932/04/10, MRN [id number]  Patient Location:  Home, accompanied by her daughter.  Provider location:   Office    Chief Complaint:  "I have COVID"  History of Present Illness:    Shannon Mcconnell is a 89 y.o. female who presents via video conferencing for a telehealth visit today.    The patient does have symptoms concerning for COVID-19 infection (fever, chills, cough, or new shortness of breath).   She presents today for virtual visit. She prefers this method of contact due to COVID-19 pandemic.  Patient's daughter states her symptoms started on Monday. She developed scratchy throat on Monday. Patient tested positive for COVID today. Patient's daughter states yesterday she was lethargic, fatigue set in yesterday. Today, she has cough. Denies headache, fever/chills.       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 Patient switched to taking dose in the pm per Dr. Baird Cancer   aspirin EC 81 MG tablet Take 1 tablet (81 mg total) by mouth daily. Swallow whole.   atorvastatin (LIPITOR) 80 MG tablet Take 1 tablet (80 mg total) by mouth daily.   benzonatate (TESSALON PERLES) 100 MG capsule Take 1 capsule (100 mg total) by mouth 3 (three) times daily as needed for cough.   Blood Glucose Monitoring Suppl (TRUE METRIX METER) w/Device KIT USE AS DIRECTED TO CHECK BLOOD SUGAR TWICE DAILY DX: E11.65   Cholecalciferol (VITAMIN D3) 50 MCG (2000 UT) capsule TAKE 1 CAPSULE BY MOUTH TWICE DAILY **DISCONTINUE  VITAMIN  D2  50,000  IU**   glucose blood (RELION TRUE METRIX TEST STRIPS) test strip USE AS DIRECTED TWICE DAILY dx: e11.9   hydrochlorothiazide (MICROZIDE) 12.5 MG capsule Take 1 capsule (12.5 mg total) by mouth daily.   Menthol, Topical Analgesic, (ICY HOT EX) Apply 1 Application topically daily as needed (knee pain).   Multiple Vitamins-Minerals (ADULT ONE DAILY GUMMIES) CHEW Chew 1 tablet by mouth every  morning.   [EXPIRED] nirmatrelvir/ritonavir, renal dosing, (PAXLOVID) 10 x 150 MG & 10 x '100MG'$  TABS Take 2 tablets by mouth 2 (two) times daily for 5 days. Patient GFR is 34.   olmesartan (BENICAR) 40 MG tablet Take 1 tablet (40 mg total) by mouth daily.   OVER THE COUNTER MEDICATION Take 1 tablet by mouth every morning. "Laminine" - amino acid supplement     Allergies:   Patient has no known allergies.   Social History   Tobacco Use    Smoking status: Never   Smokeless tobacco: Never  Vaping Use   Vaping Use: Never used  Substance Use Topics   Alcohol use: Never   Drug use: Never     Family Hx: The patient's family history includes Hypertension in her father and mother.  ROS:   Please see the history of present illness.    Review of Systems  Constitutional:  Positive for malaise/fatigue.  HENT:  Positive for congestion.   Respiratory:  Positive for cough.   Cardiovascular: Negative.   Gastrointestinal: Negative.   Neurological: Negative.   Psychiatric/Behavioral: Negative.      All other systems reviewed and are negative.   Labs/Other Tests and Data Reviewed:    Recent Labs: 03/04/2022: Magnesium 2.0 08/12/2022: ALT 13; BUN 34; Creatinine, Ser 1.59; Hemoglobin 11.5; Platelets 264; Potassium 3.9; Sodium 134; TSH 3.210   Recent Lipid Panel Lab Results  Component Value Date/Time   CHOL 146 08/12/2022 10:28 AM   TRIG 57 08/12/2022 10:28 AM   HDL 64 08/12/2022 10:28 AM   CHOLHDL 2.3 08/12/2022 10:28 AM   CHOLHDL 2.7 02/09/2022 05:11 AM   LDLCALC 70 08/12/2022 10:28 AM    Wt Readings from Last 3 Encounters:  08/12/22 141 lb 6.4 oz (64.1 kg)  04/10/22 141 lb 9.6 oz (64.2 kg)  03/18/22 143 lb 3.2 oz (65 kg)     Exam:    Vital Signs:  There were no vitals taken for this visit.    Physical Exam Vitals and nursing note reviewed.  Constitutional:      Appearance: Normal appearance.  HENT:     Head: Normocephalic and atraumatic.  Eyes:     Extraocular Movements: Extraocular movements intact.  Pulmonary:     Effort: Pulmonary effort is normal.     Comments: Does not have labored breathing Musculoskeletal:     Cervical back: Normal range of motion.  Neurological:     Mental Status: She is alert and oriented to person, place, and time.  Psychiatric:        Mood and Affect: Affect normal.     ASSESSMENT & PLAN:    1. Positive self-administered antigen test for COVID-19 Comments: She  requests treatment, will send renal dose of Paxlovid. She is encouraged to stay well hydrated while on meds.  Advised patient to take Vitamin C, D, Zinc.  Keep yourself hydrated with a lot of water and rest. I will send Tessalon perles for cough.  Take Tylenol or pain reliever every 4-6 hours as needed for pain/fever/body ache. If you have elevated blood pressure, you can take OTC Coricidin. You can also take OTC Oscillococcinum, a homeopathic remedy,  to help with your symptoms.  Educated patient that if symptoms get worse or if he/she experiences any SOB, chest pain or pain in their legs to seek immediate emergency care. Call office ASAP if you have any questions. Quarantine for 5 days from the start of symptoms.  Wear a mask around other people.  2. Stage 3b chronic kidney disease ([place]) Comments: Due to underlying CKD, I will renal dose of Paxlovid. She is encouraged to stay well hydrated.    COVID-19 Education: The signs and symptoms of COVID-19 were discussed with the patient and how to seek care for testing (follow up with PCP or arrange E-visit).  The importance of social distancing was discussed today.  Patient Risk:   After full review of this patients clinical status, I feel that they are at least moderate risk at this time.  Time:   Today, I have spent 12 minutes  with the patient with telehealth technology discussing above diagnoses.     Medication Adjustments/Labs and Tests Ordered: Current medicines are reviewed at length with the patient today.  Concerns regarding medicines are outlined above.   Tests Ordered: No orders of the defined types were placed in this encounter.   Medication Changes: Meds ordered this encounter  Medications   nirmatrelvir/ritonavir, renal dosing, (PAXLOVID) 10 x 150 MG & 10 x '100MG'$  TABS    Sig: Take 2 tablets by mouth 2 (two) times daily for 5 days. Patient GFR is 34.    Dispense:  20 tablet    Refill:  0   benzonatate (TESSALON PERLES) 100 MG  capsule    Sig: Take 1 capsule (100 mg total) by mouth 3 (three) times daily as needed for cough.    Dispense:  30 capsule    Refill:  1    Disposition:  Follow up prn  Signed, [name], MD

## 2022-08-21 ENCOUNTER — Telehealth: Payer: Self-pay | Admitting: Internal Medicine

## 2022-08-21 ENCOUNTER — Ambulatory Visit: Payer: Self-pay

## 2022-08-21 NOTE — Patient Outreach (Signed)
  Care Coordination   Follow Up Visit Note   08/21/2022 Name: Shannon Mcconnell MRN: [id number] DOB: [birth year]  Shannon Mcconnell is a 89 y.o. year old female who sees Glendale Chard, MD for primary care. I spoke with daughter Shannon Mcconnell by phone today.  What matters to the patients health and wellness today?  Patient will continue to follow PCP recommendations for treatment of COVID 19.    Goals Addressed             This Visit's Progress    COVID 19 infection       Care Coordination Interventions: Completed successful outbound call to daughter Shannon Mcconnell Provided education to patient to enhance basic understanding of COVID-19 as a viral disease, measures to prevent exposure, signs and symptoms, recommended vaccine schedule, when to contact provider Determined patient is adhering to taking the prescribed antiviral medication Paxlovid, as prescribed  Determined daughter Shannon Mcconnell remains to be negative, she is able to help care for her mother Discussed patient is staying well hydrated and getting plenty of rest Instructed daughter Shannon Mcconnell to keep PCP well informed of new symptoms or concens           SDOH assessments and interventions completed:  No     Care Coordination Interventions:  Yes, provided   Follow up plan: Follow up call scheduled for 09/04/22 '@12'$  PM    Encounter Outcome:  Pt. Visit Completed

## 2022-08-21 NOTE — Patient Instructions (Signed)
Visit Information  Thank you for taking time to visit with me today. Please don't hesitate to contact me if I can be of assistance to you.   Following are the goals we discussed today:   Goals Addressed             This Visit's Progress    COVID 19 infection       Care Coordination Interventions: Completed successful outbound call to daughter [name] Provided education to patient to enhance basic understanding of COVID-19 as a viral disease, measures to prevent exposure, signs and symptoms, recommended vaccine schedule, when to contact provider Determined patient is adhering to taking the prescribed antiviral medication Paxlovid, as prescribed  Determined daughter [name] remains to be negative, she is able to help care for her mother Discussed patient is staying well hydrated and getting plenty of rest Instructed daughter [name] to keep PCP well informed of new symptoms or concens           Our next appointment is by telephone on 09/04/22 at 12 PM  Please call the care guide team at [phone] if you need to cancel or reschedule your appointment.   If you are experiencing a Mental Health or [place] or need someone to talk to, please call 1-800-273-TALK (toll free, 24 hour hotline) go to [facility] [place] [phone])  The patient verbalized understanding of instructions, educational materials, and care plan provided today and DECLINED offer to receive copy of patient instructions, educational materials, and care plan.   Barb Merino, RN, BSN, CCM Care Management Coordinator [facility] Care Management Direct Phone: [phone]

## 2022-08-27 ENCOUNTER — Encounter: Payer: Self-pay | Admitting: Internal Medicine

## 2022-09-04 ENCOUNTER — Ambulatory Visit: Payer: Self-pay

## 2022-09-04 NOTE — Patient Outreach (Signed)
  Care Coordination   Follow Up Visit Note   09/04/2022 Name: Shannon Mcconnell MRN: [id number] DOB: 1931-10-25  Merlee Kellman is a 89 y.o. year old female who sees Glendale Chard, MD for primary care. I spoke with  Dwyane Luo by phone today.  What matters to the patients health and wellness today?  Daughter will assist patient with self monitoring BP at home.    Goals Addressed               This Visit's Progress     Patient Stated     I am worried about my blood pressure (pt-stated)        Care Coordination Interventions: Evaluation of current treatment plan related to hypertension self management and patient's adherence to plan as established by provider Provided assistance with obtaining home blood pressure monitor via [employer] ; Advised patient, providing education and rationale, to monitor blood pressure daily and record, calling PCP for findings outside established parameters Provided written and verbal educated to daughter and patient about how to accurately monitor BP at home; provided BP log to record readings  Educated patient/daughter regarding target BP <130/80         Other     COMPLETED: COVID 19 infection        Care Coordination Interventions: Completed successful outbound call to daughter [name] and patient  Determined patient feels her symptoms have greatly improved, although she continues to have an intermittent cough Advised patient to discuss persistent or worsening symptoms with provider Discussed effects, symptoms, and management of "long COVID"' Educated patient on the importance of staying well hydrated with water, aiming for 48-64 oz daily unless otherwise directed by her doctor         SDOH assessments and interventions completed:  No     Care Coordination Interventions:  Yes, provided   Follow up plan: Follow up call scheduled for 09/25/22 @10 :30 AM    Encounter Outcome:  Pt. Visit Completed

## 2022-09-04 NOTE — Patient Instructions (Addendum)
Visit Information  Thank you for taking time to visit with me today. Please don't hesitate to contact me if I can be of assistance to you.   Following are the goals we discussed today:   Goals Addressed               This Visit's Progress     Patient Stated     I am worried about my blood pressure (pt-stated)        Care Coordination Interventions: Evaluation of current treatment plan related to hypertension self management and patient's adherence to plan as established by provider Provided assistance with obtaining home blood pressure monitor via [employer] ; Advised patient, providing education and rationale, to monitor blood pressure daily and record, calling PCP for findings outside established parameters Provided written and verbal educated to daughter and patient about how to accurately monitor BP at home; provided BP log to record readings  Educated patient/daughter regarding target BP <130/80         Other     COMPLETED: COVID 19 infection        Care Coordination Interventions: Completed successful outbound call to daughter [name] and patient  Determined patient feels her symptoms have greatly improved, although she continues to have an intermittent cough Advised patient to discuss persistent or worsening symptoms with provider Discussed effects, symptoms, and management of "long COVID"' Educated patient on the importance of staying well hydrated with water, aiming for 48-64 oz daily unless otherwise directed by her doctor         Our next appointment is by telephone on 09/25/22 at 10:30 AM  Please call the care guide team at [phone] if you need to cancel or reschedule your appointment.   If you are experiencing a Mental Health or [place] or need someone to talk to, please call 1-800-273-TALK (toll free, 24 hour hotline) go to [facility] [place] [phone])  The patient  verbalized understanding of instructions, educational materials, and care plan provided today and DECLINED offer to receive copy of patient instructions, educational materials, and care plan.   Barb Merino, RN, BSN, CCM Care Management Coordinator [facility] Care Management Direct Phone: [phone]

## 2022-09-09 ENCOUNTER — Other Ambulatory Visit: Payer: Self-pay | Admitting: Internal Medicine

## 2022-09-10 ENCOUNTER — Ambulatory Visit (INDEPENDENT_AMBULATORY_CARE_PROVIDER_SITE_OTHER): Payer: Medicare PPO

## 2022-09-10 ENCOUNTER — Ambulatory Visit: Payer: Medicare PPO | Admitting: Internal Medicine

## 2022-09-10 VITALS — Ht 64.0 in | Wt 142.0 lb

## 2022-09-10 DIAGNOSIS — Z0000 Encounter for general adult medical examination without abnormal findings: Secondary | ICD-10-CM | POA: Diagnosis not present

## 2022-09-10 NOTE — Patient Instructions (Signed)
Shannon Mcconnell , Thank you for taking time to come for your Medicare Wellness Visit. I appreciate your ongoing commitment to your health goals. Please review the following plan we discussed and let me know if I can assist you in the future.   These are the goals we discussed:  Goals       Exercise 150 min/wk Moderate Activity      07/07/2019, wants to start exercising regularly      I am worried about my blood pressure (pt-stated)      Care Coordination Interventions: Evaluation of current treatment plan related to hypertension self management and patient's adherence to plan as established by provider Provided assistance with obtaining home blood pressure monitor via [employer] ; Advised patient, providing education and rationale, to monitor blood pressure daily and record, calling PCP for findings outside established parameters Provided written and verbal educated to daughter and patient about how to accurately monitor BP at home; provided BP log to record readings  Educated patient/daughter regarding target BP <130/80         Patient Stated      08/29/2020, no goals      Patient Stated      09/04/2021, wants to keep sugar and blood pressure under control      Patient Stated      09/10/2022, keep BP and sugar under control        This is a list of the screening recommended for you and due dates:  Health Maintenance  Topic Date Due   COVID-19 Vaccine (6 - 2023-24 season) 07/25/2022   Complete foot exam   01/14/2023   Hemoglobin A1C  02/10/2023   Eye exam for diabetics  05/07/2023   Medicare Annual Wellness Visit  09/10/2023   DTaP/Tdap/Td vaccine (2 - Td or Tdap) 09/05/2031   Pneumonia Vaccine  Completed   Flu Shot  Completed   DEXA scan (bone density measurement)  Completed   Zoster (Shingles) Vaccine  Completed   HPV Vaccine  Aged Out    Advanced directives: Advance directive discussed with you today.   Conditions/risks identified: none  Next appointment:  Follow up in one year for your annual wellness visit    Preventive Care 65 Years and Older, Female Preventive care refers to lifestyle choices and visits with your health care provider that can promote health and wellness. What does preventive care include? A yearly physical exam. This is also called an annual well check. Dental exams once or twice a year. Routine eye exams. Ask your health care provider how often you should have your eyes checked. Personal lifestyle choices, including: Daily care of your teeth and gums. Regular physical activity. Eating a healthy diet. Avoiding tobacco and drug use. Limiting alcohol use. Practicing safe sex. Taking low-dose aspirin every day. Taking vitamin and mineral supplements as recommended by your health care provider. What happens during an annual well check? The services and screenings done by your health care provider during your annual well check will depend on your age, overall health, lifestyle risk factors, and family history of disease. Counseling  Your health care provider may ask you questions about your: Alcohol use. Tobacco use. Drug use. Emotional well-being. Home and relationship well-being. Sexual activity. Eating habits. History of falls. Memory and ability to understand (cognition). Work and work Statistician. Reproductive health. Screening  You may have the following tests or measurements: Height, weight, and BMI. Blood pressure. Lipid and cholesterol levels. These may be checked every 5 years,  or more frequently if you are over 34 years old. Skin check. Lung cancer screening. You may have this screening every year starting at age 76 if you have a 30-pack-year history of smoking and currently smoke or have quit within the past 15 years. Fecal occult blood test (FOBT) of the stool. You may have this test every year starting at age 74. Flexible sigmoidoscopy or colonoscopy. You may have a sigmoidoscopy every 5 years or a  colonoscopy every 10 years starting at age 21. Hepatitis C blood test. Hepatitis B blood test. Sexually transmitted disease (STD) testing. Diabetes screening. This is done by checking your blood sugar (glucose) after you have not eaten for a while (fasting). You may have this done every 1-3 years. Bone density scan. This is done to screen for osteoporosis. You may have this done starting at age 56. Mammogram. This may be done every 1-2 years. Talk to your health care provider about how often you should have regular mammograms. Talk with your health care provider about your test results, treatment options, and if necessary, the need for more tests. Vaccines  Your health care provider may recommend certain vaccines, such as: Influenza vaccine. This is recommended every year. Tetanus, diphtheria, and acellular pertussis (Tdap, Td) vaccine. You may need a Td booster every 10 years. Zoster vaccine. You may need this after age 71. Pneumococcal 13-valent conjugate (PCV13) vaccine. One dose is recommended after age 46. Pneumococcal polysaccharide (PPSV23) vaccine. One dose is recommended after age 1. Talk to your health care provider about which screenings and vaccines you need and how often you need them. This information is not intended to replace advice given to you by your health care provider. Make sure you discuss any questions you have with your health care provider. Document Released: 06/29/2015 Document Revised: 02/20/2016 Document Reviewed: 04/03/2015 Elsevier Interactive Patient Education  2017 [employer] Prevention in the Home Falls can cause injuries. They can happen to people of all ages. There are many things you can do to make your home safe and to help prevent falls. What can I do on the outside of my home? Regularly fix the edges of walkways and driveways and fix any cracks. Remove anything that might make you trip as you walk through a door, such as a raised step or  threshold. Trim any bushes or trees on the path to your home. Use bright outdoor lighting. Clear any walking paths of anything that might make someone trip, such as rocks or tools. Regularly check to see if handrails are loose or broken. Make sure that both sides of any steps have handrails. Any raised decks and porches should have guardrails on the edges. Have any leaves, snow, or ice cleared regularly. Use sand or salt on walking paths during winter. Clean up any spills in your garage right away. This includes oil or grease spills. What can I do in the bathroom? Use night lights. Install grab bars by the toilet and in the tub and shower. Do not use towel bars as grab bars. Use non-skid mats or decals in the tub or shower. If you need to sit down in the shower, use a plastic, non-slip stool. Keep the floor dry. Clean up any water that spills on the floor as soon as it happens. Remove soap buildup in the tub or shower regularly. Attach bath mats securely with double-sided non-slip rug tape. Do not have throw rugs and other things on the floor that can make you trip.  What can I do in the bedroom? Use night lights. Make sure that you have a light by your bed that is easy to reach. Do not use any sheets or blankets that are too big for your bed. They should not hang down onto the floor. Have a firm chair that has side arms. You can use this for support while you get dressed. Do not have throw rugs and other things on the floor that can make you trip. What can I do in the kitchen? Clean up any spills right away. Avoid walking on wet floors. Keep items that you use a lot in easy-to-reach places. If you need to reach something above you, use a strong step stool that has a grab bar. Keep electrical cords out of the way. Do not use floor polish or wax that makes floors slippery. If you must use wax, use non-skid floor wax. Do not have throw rugs and other things on the floor that can make you  trip. What can I do with my stairs? Do not leave any items on the stairs. Make sure that there are handrails on both sides of the stairs and use them. Fix handrails that are broken or loose. Make sure that handrails are as long as the stairways. Check any carpeting to make sure that it is firmly attached to the stairs. Fix any carpet that is loose or worn. Avoid having throw rugs at the top or bottom of the stairs. If you do have throw rugs, attach them to the floor with carpet tape. Make sure that you have a light switch at the top of the stairs and the bottom of the stairs. If you do not have them, ask someone to add them for you. What else can I do to help prevent falls? Wear shoes that: Do not have high heels. Have rubber bottoms. Are comfortable and fit you well. Are closed at the toe. Do not wear sandals. If you use a stepladder: Make sure that it is fully opened. Do not climb a closed stepladder. Make sure that both sides of the stepladder are locked into place. Ask someone to hold it for you, if possible. Clearly mark and make sure that you can see: Any grab bars or handrails. First and last steps. Where the edge of each step is. Use tools that help you move around (mobility aids) if they are needed. These include: Canes. Walkers. Scooters. Crutches. Turn on the lights when you go into a dark area. Replace any light bulbs as soon as they burn out. Set up your furniture so you have a clear path. Avoid moving your furniture around. If any of your floors are uneven, fix them. If there are any pets around you, be aware of where they are. Review your medicines with your doctor. Some medicines can make you feel dizzy. This can increase your chance of falling. Ask your doctor what other things that you can do to help prevent falls. This information is not intended to replace advice given to you by your health care provider. Make sure you discuss any questions you have with your  health care provider. Document Released: 03/29/2009 Document Revised: 11/08/2015 Document Reviewed: 07/07/2014 Elsevier Interactive Patient Education  2017 [employer].

## 2022-09-10 NOTE — Progress Notes (Signed)
I connected with  Dwyane Luo on 09/10/22 by a audio enabled telemedicine application and verified that I am speaking with the correct person using two identifiers. Daughter Shannon Mcconnell was also on the call.  Patient Location: Home  Provider Location: Office/Clinic  I discussed the limitations of evaluation and management by telemedicine. The patient expressed understanding and agreed to proceed.  Subjective:   Shannon Mcconnell is a 89 y.o. female who presents for Medicare Annual (Subsequent) preventive examination.  Review of Systems     Cardiac Risk Factors include: advanced age (>11men, >52 women);diabetes mellitus;hypertension     Objective:    Today's Vitals   09/10/22 1525  Weight: 142 lb (64.4 kg)  Height: 5\' 4"  (1.626 m)   Body mass index is 24.37 kg/m.     09/10/2022    3:31 PM 02/28/2022    2:35 PM 02/08/2022   10:38 AM 09/04/2021    3:34 PM 08/29/2020    3:40 PM 07/07/2019   11:27 AM 05/26/2018   12:45 PM  Advanced Directives  Does Patient Have a Medical Advance Directive? No No No No No No No  Would patient like information on creating a medical advance directive?  No - Patient declined No - Patient declined Yes (MAU/Ambulatory/Procedural Areas - Information given)  Yes (MAU/Ambulatory/Procedural Areas - Information given)     Current Medications (verified) Outpatient Encounter Medications as of 09/10/2022  Medication Sig   amLODipine (NORVASC) 10 MG tablet Take 1 tablet (10 mg total) by mouth daily. Patient switched to taking dose in the pm per Dr. Baird Cancer   aspirin EC 81 MG tablet Take 1 tablet (81 mg total) by mouth daily. Swallow whole.   atorvastatin (LIPITOR) 80 MG tablet Take 1 tablet (80 mg total) by mouth daily.   benzonatate (TESSALON PERLES) 100 MG capsule Take 1 capsule (100 mg total) by mouth 3 (three) times daily as needed for cough.   Blood Glucose Monitoring Suppl (TRUE METRIX METER) w/Device KIT USE AS DIRECTED TO CHECK BLOOD SUGAR TWICE DAILY DX:  E11.65   Cholecalciferol (VITAMIN D3) 50 MCG (2000 UT) capsule TAKE 1 CAPSULE BY MOUTH TWICE DAILY **DISCONTINUE  VITAMIN  D2  50,000  IU**   glucose blood (RELION TRUE METRIX TEST STRIPS) test strip USE AS DIRECTED TWICE DAILY   hydrochlorothiazide (MICROZIDE) 12.5 MG capsule Take 1 capsule (12.5 mg total) by mouth daily.   Menthol, Topical Analgesic, (ICY HOT EX) Apply 1 Application topically daily as needed (knee pain).   Multiple Vitamins-Minerals (ADULT ONE DAILY GUMMIES) CHEW Chew 1 tablet by mouth every morning.   olmesartan (BENICAR) 40 MG tablet Take 1 tablet (40 mg total) by mouth daily.   OVER THE COUNTER MEDICATION Take 1 tablet by mouth every morning. "Laminine" - amino acid supplement   No facility-administered encounter medications on file as of 09/10/2022.    Allergies (verified) Patient has no known allergies.   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   DM (diabetes mellitus) ([place])    Gastroesophageal reflux disease 02/08/2022   High cholesterol    Hypertension    Pure hypercholesterolemia 08/30/2018   Stroke ([place]) 01/2022   Past Surgical History:  Procedure Laterality Date   ABDOMINAL HYSTERECTOMY     mastectomy Left    Family History  Problem Relation Age of Onset   Hypertension Mother    Hypertension Father    Social History   Socioeconomic History   Marital status: Divorced  Spouse name: Not on file   Number of children: 8   Years of education: Not on file   Highest education level: 10th grade  Occupational History   Occupation: retired  Tobacco Use   Smoking status: Never   Smokeless tobacco: Never  Vaping Use   Vaping Use: Never used  Substance and Sexual Activity   Alcohol use: Never   Drug use: Never   Sexual activity: Not Currently  Other Topics Concern   Not on file  Social History Narrative   03/18/22 dgtr Shannon Mcconnell lives with her   Social Determinants of Health   Financial Resource  Strain: Low Risk  (09/10/2022)   Overall Financial Resource Strain (CARDIA)    Difficulty of Paying Living Expenses: Not hard at all  Food Insecurity: No Food Insecurity (09/10/2022)   Hunger Vital Sign    Worried About Running Out of Food in the Last Year: Never true    [place] in the Last Year: Never true  Transportation Needs: No Transportation Needs (09/10/2022)   PRAPARE - Hydrologist (Medical): No    Lack of Transportation (Non-Medical): No  Physical Activity: Inactive (09/10/2022)   Exercise Vital Sign    Days of Exercise per Week: 0 days    Minutes of Exercise per Session: 0 min  Stress: No Stress Concern Present (09/10/2022)   [place]    Feeling of Stress : Not at all  Social Connections: Not on file    Tobacco Counseling Counseling given: Not Answered   Clinical Intake:  Pre-visit preparation completed: Yes  Pain : No/denies pain     Nutritional Status: BMI of 19-24  Normal Nutritional Risks: Nausea/ vomitting/ diarrhea (diarrhea a couple days ago, resolved) Diabetes: Yes  How often do you need to have someone help you when you read instructions, pamphlets, or other written materials from your doctor or pharmacy?: 1 - Never  Diabetic? Yes Nutrition Risk Assessment:  Has the patient had any N/V/D within the last 2 months?  No  Does the patient have any non-healing wounds?  No  Has the patient had any unintentional weight loss or weight gain?  No   Diabetes:  Is the patient diabetic?  Yes  If diabetic, was a CBG obtained today?  No  Did the patient bring in their glucometer from home?  No  How often do you monitor your CBG's? daily.   Financial Strains and Diabetes Management:  Are you having any financial strains with the device, your supplies or your medication? No .  Does the patient want to be seen by Chronic Care Management for management of their  diabetes?  No  Would the patient like to be referred to a Nutritionist or for Diabetic Management?  No   Diabetic Exams:  Diabetic Eye Exam: Completed 05/06/2022 Diabetic Foot Exam: Completed 01/13/2022   Interpreter Needed?: No  Information entered by :: NAllen LPN   Activities of Daily Living    09/10/2022    3:32 PM 02/09/2022    1:00 PM  In your present state of health, do you have any difficulty performing the following activities:  Hearing? 0 0  Vision? 0 1  Difficulty concentrating or making decisions? 0 0  Walking or climbing stairs? 0 1  Dressing or bathing? 0 0  Doing errands, shopping? 0 0  Preparing Food and eating ? N   Using the Toilet? N   In  the past six months, have you accidently leaked urine? N   Do you have problems with loss of bowel control? N   Managing your Medications? N   Managing your Finances? N   Housekeeping or managing your Housekeeping? N     Patient Care Team: Glendale Chard, MD as PCP - General (Internal Medicine) Rex Kras, Claudette Stapler, RN as [place] any recent Medical Services you may have received from other than [facility] providers in the past year (date may be approximate).     Assessment:   This is a routine wellness examination for [place].  Hearing/Vision screen Vision Screening - Comments:: Regular eye exams, [facility]  Dietary issues and exercise activities discussed: Current Exercise Habits: The patient does not participate in regular exercise at present   Goals Addressed             This Visit's Progress    Patient Stated       09/10/2022, keep BP and sugar under control       Depression Screen    09/10/2022    3:32 PM 08/12/2022    9:31 AM 04/10/2022   12:07 PM 02/28/2022    2:35 PM 02/13/2022    4:25 PM 09/04/2021    3:36 PM 08/29/2020    3:40 PM  PHQ 2/9 Scores  PHQ - 2 Score 0 0 0 0 0 0 0    Fall Risk    09/10/2022    3:32 PM 08/12/2022    9:31 AM 04/10/2022   12:06 PM  02/28/2022    2:35 PM 02/13/2022    4:25 PM  Fall Risk   Falls in the past year? 0 0 0 0 0  Number falls in past yr: 0 0 0  0  Injury with Fall? 0 0 0  0  Risk for fall due to : Medication side effect No Fall Risks No Fall Risks  Impaired balance/gait;Impaired mobility  Follow up Falls prevention discussed;Education provided;Falls evaluation completed Falls evaluation completed Falls evaluation completed  Falls evaluation completed    FALL RISK PREVENTION PERTAINING TO THE HOME:  Any stairs in or around the home? No  If so, are there any without handrails? N/a Home free of loose throw rugs in walkways, pet beds, electrical cords, etc? Yes  Adequate lighting in your home to reduce risk of falls? Yes   ASSISTIVE DEVICES UTILIZED TO PREVENT FALLS:  Life alert? No  Use of a cane, walker or w/c? No  Grab bars in the bathroom? Yes  Shower chair or bench in shower? Yes  Elevated toilet seat or a handicapped toilet? Yes   TIMED UP AND GO:  Was the test performed? No .      Cognitive Function:    05/26/2018   12:12 PM  MMSE - Mini Mental State Exam  Orientation to Place 5  Attention/ Calculation 5        09/10/2022    3:33 PM 09/04/2021    3:37 PM 08/29/2020    3:42 PM 07/07/2019   11:31 AM  6CIT Screen  What Year? 0 points 0 points 0 points 0 points  What month? 0 points 0 points 0 points 0 points  What time? 0 points 0 points 0 points 0 points  Count back from 20 2 points 0 points 0 points 0 points  Months in reverse 0 points 4 points 4 points 4 points  Repeat phrase 6 points 6 points 10 points 0 points  Total Score 8 points 10 points 14 points 4 points    Immunizations Immunization History  Administered Date(s) Administered   Covid-19, Mrna,Vaccine(Spikevax)30yrs and older 05/30/2022   Fluad Quad(high Dose 65+) 03/04/2022   Influenza, High Dose Seasonal PF 04/11/2018, 03/09/2019, 03/31/2021   Influenza-Unspecified 05/12/2020   Moderna Covid-19 Vaccine Bivalent  Booster 45yrs & up 06/11/2021   Moderna SARS-COV2 Booster Vaccination 10/02/2020   Moderna Sars-Covid-2 Vaccination 06/27/2019, 07/26/2019, 04/07/2020   Pneumococcal Conjugate-13 10/17/2013, 02/04/2017   Pneumococcal Polysaccharide-23 02/05/2012   Tdap 09/04/2021   Zoster Recombinat (Shingrix) 06/08/2018, 01/08/2019    TDAP status: Up to date  Flu Vaccine status: Up to date  Pneumococcal vaccine status: Up to date  Covid-19 vaccine status: Completed vaccines  Qualifies for Shingles Vaccine? Yes   Zostavax completed Yes   Shingrix Completed?: Yes  Screening Tests Health Maintenance  Topic Date Due   COVID-19 Vaccine (6 - 2023-24 season) 07/25/2022   Medicare Annual Wellness (AWV)  09/05/2022   FOOT EXAM  01/14/2023   HEMOGLOBIN A1C  02/10/2023   OPHTHALMOLOGY EXAM  05/07/2023   DTaP/Tdap/Td (2 - Td or Tdap) 09/05/2031   Pneumonia Vaccine 65+ Years old  Completed   INFLUENZA VACCINE  Completed   DEXA SCAN  Completed   Zoster Vaccines- Shingrix  Completed   HPV VACCINES  Aged Out    Health Maintenance  Health Maintenance Due  Topic Date Due   COVID-19 Vaccine (6 - 2023-24 season) 07/25/2022   Medicare Annual Wellness (AWV)  09/05/2022    Colorectal cancer screening: No longer required.   Mammogram status: No longer required due to age.  Bone Density status: Completed 01/30/2021.   Lung Cancer Screening: (Low Dose CT Chest recommended if Age 73-80 years, 30 pack-year currently smoking OR have quit w/in 15years.) does not qualify.   Lung Cancer Screening Referral: no  Additional Screening:  Hepatitis C Screening: does not qualify;   Vision Screening: Recommended annual ophthalmology exams for early detection of glaucoma and other disorders of the eye. Is the patient up to date with their annual eye exam?  Yes  Who is the provider or what is the name of the office in which the patient attends annual eye exams? [facility] If pt is not established with a provider,  would they like to be referred to a provider to establish care? No .   Dental Screening: Recommended annual dental exams for proper oral hygiene  Community Resource Referral / Chronic Care Management: CRR required this visit?  No   CCM required this visit?  No      Plan:     I have personally reviewed and noted the following in the patient's chart:   Medical and social history Use of alcohol, tobacco or illicit drugs  Current medications and supplements including opioid prescriptions. Patient is not currently taking opioid prescriptions. Functional ability and status Nutritional status Physical activity Advanced directives List of other physicians Hospitalizations, surgeries, and ER visits in previous 12 months Vitals Screenings to include cognitive, depression, and falls Referrals and appointments  In addition, I have reviewed and discussed with patient certain preventive protocols, quality metrics, and best practice recommendations. A written personalized care plan for preventive services as well as general preventive health recommendations were provided to patient.     Kellie Simmering, LPN   [id number]   Nurse Notes: none  Due to this being a virtual visit, the after visit summary with patients personalized plan was offered to patient via mail or my-chart.  to pick up at office at next visit

## 2022-09-25 ENCOUNTER — Ambulatory Visit: Payer: Self-pay

## 2022-09-25 NOTE — Patient Outreach (Signed)
  Care Coordination   Follow Up Visit Note   09/25/2022 Name: Shannon Mcconnell MRN: [id number] DOB: [birth year]  Shannon Mcconnell is a 89 y.o. year old female who sees Shannon Peng, MD for primary care. I spoke with  Shannon Mcconnell by phone today.  What matters to the patients health and wellness today?  Patient will use portion control and meal planning to help control her diabetes.     Goals Addressed               This Visit's Progress     Patient Stated     I am worried about my blood pressure (pt-stated)        Care Coordination Interventions: Evaluation of current treatment plan related to hypertension self management and patient's adherence to plan as established by provider Confirmed patient received her BP cuff via [facility] Care Management  Discussed and reviewed with patient she is monitoring her BP at home with the help of her daughter Shannon Mcconnell who has advised her that her BP readings have been within normal range and daughter Shannon Mcconnell has recorded these readings Re-educated patient about the importance of limiting Sodium in her diet  Advised patient, providing education and rationale, to monitor blood pressure daily and record, calling PCP for findings outside established parameters Last practice recorded BP readings:  BP Readings from Last 3 Encounters:  08/12/22 (!) 140/74  04/10/22 130/68  03/18/22 136/85  Most recent eGFR/CrCl:  Lab Results  Component Value Date   EGFR 31 (L) 08/12/2022    No components found for: "CRCL"      Other     To use portion control and meal planning to help control diabetes        Care Coordination Interventions: Provided education to patient about basic DM disease process Provided patient with written educational materials related to hypo and hyperglycemia and importance of correct treatment Advised patient, providing education and rationale, to check cbg daily before breakfast and record, calling PCP for findings outside established  parameters Review of patient status, including review of consultants reports, relevant laboratory and other test results, and medications completed Counseled on Diabetic diet, my plate method and portion control Mailed printed educational materials related to the Diabetic Diet, Carb Choice list, Diabetes Zone Tool; Blood Glucose log; Meal Planning  Lab Results  Component Value Date   HGBA1C 6.1 (H) 08/12/2022     Interventions Today    Flowsheet Row Most Recent Value  Chronic Disease   Chronic disease during today's visit Diabetes, Hypertension (HTN)  General Interventions   General Interventions Discussed/Reviewed General Interventions Discussed, General Interventions Reviewed, Labs  Education Interventions   Education Provided Provided Education, Provided Dance movement psychotherapist On Nutrition, Labs, Blood Sugar Monitoring  Labs Reviewed Hgb A1c  Nutrition Interventions   Nutrition Discussed/Reviewed Nutrition Discussed, Portion sizes, Nutrition Reviewed, Carbohydrate meal planning, Decreasing salt          SDOH assessments and interventions completed:  No     Care Coordination Interventions:  Yes, provided   Follow up plan: Follow up call scheduled for 12/23/22 @10 :30 AM    Encounter Outcome:  Pt. Visit Completed

## 2022-09-25 NOTE — Patient Instructions (Signed)
Visit Information  Thank you for taking time to visit with me today. Please don't hesitate to contact me if I can be of assistance to you.   Following are the goals we discussed today:   Goals Addressed               This Visit's Progress     Patient Stated     I am worried about my blood pressure (pt-stated)        Care Coordination Interventions: Evaluation of current treatment plan related to hypertension self management and patient's adherence to plan as established by provider Confirmed patient received her BP cuff via [facility] Care Management  Discussed and reviewed with patient she is monitoring her BP at home with the help of her daughter [name] who has advised her that her BP readings have been within normal range and daughter [name] has recorded these readings Re-educated patient about the importance of limiting Sodium in her diet  Advised patient, providing education and rationale, to monitor blood pressure daily and record, calling PCP for findings outside established parameters Last practice recorded BP readings:  BP Readings from Last 3 Encounters:  08/12/22 (!) 140/74  04/10/22 130/68  03/18/22 136/85  Most recent eGFR/CrCl:  Lab Results  Component Value Date   EGFR 31 (L) 08/12/2022    No components found for: "CRCL"       Other     To use portion control and meal planning to help control diabetes        Care Coordination Interventions: Provided education to patient about basic DM disease process Provided patient with written educational materials related to hypo and hyperglycemia and importance of correct treatment Advised patient, providing education and rationale, to check cbg daily before breakfast and record, calling PCP for findings outside established parameters Review of patient status, including review of consultants reports, relevant laboratory and other test results, and medications completed Counseled on Diabetic diet, my plate method and portion  control Mailed printed educational materials related to the Diabetic Diet, Carb Choice list, Diabetes Zone Tool; Blood Glucose log; Meal Planning Lab Results  Component Value Date   HGBA1C 6.1 (H) 08/12/2022          Our next appointment is by telephone on 12/23/22 at 10:30 AM  Please call the care guide team at [phone] if you need to cancel or reschedule your appointment.   If you are experiencing a Mental Health or Behavioral Health Crisis or need someone to talk to, please call 1-800-273-TALK (toll free, 24 hour hotline) go to [facility] [address] [place] [phone])  The patient verbalized understanding of instructions, educational materials, and care plan provided today and DECLINED offer to receive copy of patient instructions, educational materials, and care plan.   Delsa Sale, RN, BSN, CCM Care Management Coordinator [facility] Care Management Direct Phone: [phone]

## 2022-10-01 ENCOUNTER — Encounter: Payer: Self-pay | Admitting: *Deleted

## 2022-10-09 ENCOUNTER — Other Ambulatory Visit: Payer: Self-pay | Admitting: Internal Medicine

## 2022-10-20 ENCOUNTER — Other Ambulatory Visit: Payer: Self-pay | Admitting: Internal Medicine

## 2022-11-03 ENCOUNTER — Other Ambulatory Visit: Payer: Self-pay

## 2022-11-03 MED ORDER — HYDROCHLOROTHIAZIDE 12.5 MG PO CAPS: 12.5000 mg | ORAL_CAPSULE | Freq: Every day | ORAL | 1 refills | Status: DC

## 2022-11-20 ENCOUNTER — Other Ambulatory Visit: Payer: Self-pay | Admitting: Internal Medicine

## 2022-12-11 ENCOUNTER — Other Ambulatory Visit: Payer: Self-pay

## 2022-12-11 DIAGNOSIS — N1831 Chronic kidney disease, stage 3a: Secondary | ICD-10-CM

## 2022-12-11 DIAGNOSIS — E7800 Pure hypercholesterolemia, unspecified: Secondary | ICD-10-CM

## 2022-12-11 MED ORDER — RELION TRUE METRIX TEST STRIPS VI STRP
ORAL_STRIP | 0 refills | Status: DC
Start: 2022-12-11 — End: 2023-01-19

## 2022-12-11 MED ORDER — ATORVASTATIN CALCIUM 80 MG PO TABS
80.0000 mg | ORAL_TABLET | Freq: Every day | ORAL | 1 refills | Status: DC
Start: 2022-12-11 — End: 2023-12-07

## 2022-12-16 ENCOUNTER — Encounter: Payer: Self-pay | Admitting: Internal Medicine

## 2022-12-16 ENCOUNTER — Ambulatory Visit (INDEPENDENT_AMBULATORY_CARE_PROVIDER_SITE_OTHER): Payer: Medicare PPO | Admitting: Internal Medicine

## 2022-12-16 VITALS — BP 130/78 | HR 84 | Temp 97.9°F | Ht 64.0 in | Wt 136.0 lb

## 2022-12-16 DIAGNOSIS — I700 Atherosclerosis of aorta: Secondary | ICD-10-CM | POA: Diagnosis not present

## 2022-12-16 DIAGNOSIS — M545 Low back pain, unspecified: Secondary | ICD-10-CM

## 2022-12-16 DIAGNOSIS — Z6823 Body mass index (BMI) 23.0-23.9, adult: Secondary | ICD-10-CM | POA: Diagnosis not present

## 2022-12-16 DIAGNOSIS — I1310 Hypertensive heart and chronic kidney disease without heart failure, with stage 1 through stage 4 chronic kidney disease, or unspecified chronic kidney disease: Secondary | ICD-10-CM

## 2022-12-16 DIAGNOSIS — N1831 Chronic kidney disease, stage 3a: Secondary | ICD-10-CM | POA: Diagnosis not present

## 2022-12-16 DIAGNOSIS — E1122 Type 2 diabetes mellitus with diabetic chronic kidney disease: Secondary | ICD-10-CM

## 2022-12-16 DIAGNOSIS — N1832 Chronic kidney disease, stage 3b: Secondary | ICD-10-CM | POA: Diagnosis not present

## 2022-12-16 DIAGNOSIS — I129 Hypertensive chronic kidney disease with stage 1 through stage 4 chronic kidney disease, or unspecified chronic kidney disease: Secondary | ICD-10-CM

## 2022-12-16 DIAGNOSIS — E7800 Pure hypercholesterolemia, unspecified: Secondary | ICD-10-CM | POA: Diagnosis not present

## 2022-12-16 MED ORDER — AMLODIPINE BESYLATE 10 MG PO TABS
ORAL_TABLET | ORAL | 2 refills | Status: DC
Start: 2022-12-16 — End: 2023-10-14

## 2022-12-16 MED ORDER — OLMESARTAN MEDOXOMIL 40 MG PO TABS: 40.0000 mg | ORAL_TABLET | Freq: Every day | ORAL | 1 refills | Status: DC

## 2022-12-16 NOTE — Progress Notes (Signed)
I,Victoria T Deloria Lair, CMA,acting as a Neurosurgeon for Gwynneth Aliment, MD.,have documented all relevant documentation on the behalf of Gwynneth Aliment, MD,as directed by  Gwynneth Aliment, MD while in the presence of Gwynneth Aliment, MD.  Subjective:  Patient ID: Shannon Mcconnell , female    DOB: 1931-11-16 , 89 y.o.   MRN: [id number]  Chief Complaint  Patient presents with   Diabetes   Hypertension   Hyperlipidemia    HPI  Patient presents today for BP & DM Check, she reports compliance with medications. She is accompanied by her daughter today.  She denies headache, dizziness, chest pain, and SOB.  Additionally, she c/o right lower back pain. Usually occurs in the morning, dull/throbbing. Denies fall/trauma. She reports using Biofreeze which does help.    Hypertension This is a chronic problem. The current episode started more than 1 year ago. The problem has been gradually improving since onset. The problem is controlled. Risk factors for coronary artery disease include post-menopausal state. The current treatment provides moderate improvement. Hypertensive end-organ damage includes kidney disease and CVA.  Diabetes She presents for her follow-up diabetic visit. She has type 2 diabetes mellitus. Her disease course has been stable. There are no hypoglycemic associated symptoms. There are no diabetic associated symptoms. There are no hypoglycemic complications. Diabetic complications include a CVA. Risk factors for coronary artery disease include diabetes mellitus, dyslipidemia and post-menopausal. She is following a diabetic diet. An ACE inhibitor/angiotensin II receptor blocker is being taken. Eye exam is current.     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   DM (diabetes mellitus) (HCC)    Gastroesophageal reflux disease 02/08/2022   High cholesterol    Hypertension    Pure hypercholesterolemia 08/30/2018   Stroke (HCC) 01/2022     Family  History  Problem Relation Age of Onset   Hypertension Mother    Hypertension Father      Current Outpatient Medications:    atorvastatin (LIPITOR) 80 MG tablet, Take 1 tablet (80 mg total) by mouth daily., Disp: 90 tablet, Rfl: 1   benzonatate (TESSALON PERLES) 100 MG capsule, Take 1 capsule (100 mg total) by mouth 3 (three) times daily as needed for cough., Disp: 30 capsule, Rfl: 1   Blood Glucose Monitoring Suppl (TRUE METRIX METER) w/Device KIT, USE AS DIRECTED TO CHECK BLOOD SUGAR TWICE DAILY DX: E11.65, Disp: 1 kit, Rfl: 1   Cholecalciferol (VITAMIN D3) 50 MCG (2000 UT) capsule, TAKE 1 CAPSULE BY MOUTH TWICE DAILY **DISCONTINUE  VITAMIN  D2  50,000  IU**, Disp: 60 capsule, Rfl: 0   glucose blood (RELION TRUE METRIX TEST STRIPS) test strip, USE ONCE DAILY TO CHECK BLOOD SUGARS., Disp: 50 each, Rfl: 0   hydrochlorothiazide (MICROZIDE) 12.5 MG capsule, Take 1 capsule (12.5 mg total) by mouth daily., Disp: 90 capsule, Rfl: 1   Menthol, Topical Analgesic, (ICY HOT EX), Apply 1 Application topically daily as needed (knee pain)., Disp: , Rfl:    Multiple Vitamins-Minerals (ADULT ONE DAILY GUMMIES) CHEW, Chew 1 tablet by mouth every morning., Disp: , Rfl:    OVER THE COUNTER MEDICATION, Take 1 tablet by mouth every morning. "Laminine" - amino acid supplement, Disp: , Rfl:    amLODipine (NORVASC) 10 MG tablet, TAKE 1 TABLET BY MOUTH ONCE DAILY. PATIENT SWITCHED TO TAKING DOSE IN THE EVENING PER DR. Glorianna Gott, Disp: 90 tablet, Rfl: 2   EQ ASPIRIN ADULT LOW DOSE 81 MG tablet, TAKE 1 TABLET BY  MOUTH ONCE DAILY. SWALLOW WHOLE. (Patient not taking: Reported on 12/16/2022), Disp: 90 tablet, Rfl: 0   olmesartan (BENICAR) 40 MG tablet, Take 1 tablet (40 mg total) by mouth daily., Disp: 90 tablet, Rfl: 1   No Known Allergies   Review of Systems  Constitutional: Negative.   Respiratory: Negative.    Cardiovascular: Negative.   Musculoskeletal:  Positive for back pain.  Neurological: Negative.    Psychiatric/Behavioral: Negative.       Today's Vitals   12/16/22 0951 12/16/22 1003  BP: (!) 140/90 130/78  Pulse: 84   Temp: 97.9 F (36.6 C)   SpO2: 98%   Weight: 136 lb (61.7 kg)   Height: 5\' 4"  (1.626 m)    Body mass index is 23.34 kg/m.  Wt Readings from Last 3 Encounters:  12/16/22 136 lb (61.7 kg)  09/10/22 142 lb (64.4 kg)  08/12/22 141 lb 6.4 oz (64.1 kg)     Objective:  Physical Exam Vitals and nursing note reviewed.  Constitutional:      Appearance: Normal appearance.  HENT:     Head: Normocephalic and atraumatic.  Eyes:     Extraocular Movements: Extraocular movements intact.  Cardiovascular:     Rate and Rhythm: Normal rate and regular rhythm.     Heart sounds: Normal heart sounds.  Pulmonary:     Effort: Pulmonary effort is normal.     Breath sounds: Normal breath sounds.  Musculoskeletal:        General: Tenderness present.     Cervical back: Normal range of motion.  Skin:    General: Skin is warm.  Neurological:     General: No focal deficit present.     Mental Status: She is alert.  Psychiatric:        Mood and Affect: Mood normal.        Behavior: Behavior normal.         Assessment And Plan:  Type 2 diabetes mellitus with stage 3b chronic kidney disease, without long-term current use of insulin (HCC) Assessment & Plan: Chronic, still awaiting Renal referral. I will have referral coordinator look into this. Thus far, she has been ablel to control DM with lifestyle modification. She will rto in four months for re-evaluation.   Orders: -     CBC -     CMP14+EGFR -     Hemoglobin A1c -     PTH, intact and calcium -     Phosphorus -     Protein electrophoresis, serum  Hypertensive heart and renal disease with renal failure, stage 1 through stage 4 or unspecified chronic kidney disease, without heart failure Assessment & Plan: Chronic, fair control. Goal BP<130/80.  She will c/2 amlodipine 10mg , olmesartan 40mg  and hydrochlorothiazide  12.5mg  daily. Advised to follow low sodium diet. Will check on Renal referral, she has yet to hear about appt.   Orders: -     amLODIPine Besylate; TAKE 1 TABLET BY MOUTH ONCE DAILY. PATIENT SWITCHED TO TAKING DOSE IN THE EVENING PER DR. Shaina Gullatt  Dispense: 90 tablet; Refill: 2 -     Olmesartan Medoxomil; Take 1 tablet (40 mg total) by mouth daily.  Dispense: 90 tablet; Refill: 1  Aortic atherosclerosis (HCC) Assessment & Plan: Chronic, LDL goal < 70.  She will c/w atorvastatin 80mg  daily. Importance of medication/dietary compliance was discussed with the patient.    Acute low back pain without sciatica, unspecified back pain laterality Assessment & Plan: Possibly due to constipation, she does have h/o this.  Encouraged to stay well hydrated and optimize fiber intake.    Pure hypercholesterolemia Assessment & Plan: Chronic, due to underlying DM and aortic atherosclerosis, LDL goal <70.  She will c/w atorvastatin 80mg  daily.    Body mass index (BMI) of 23.0-23.9 in adult Assessment & Plan: She has lost five pounds since Feb 2024. She is encouraged to supplement her meals with Boost/Ensure and healthy snacks. Will recheck in four months.       Return move physical to Oct.  Patient was given opportunity to ask questions. Patient verbalized understanding of the plan and was able to repeat key elements of the plan. All questions were answered to their satisfaction.   I, Gwynneth Aliment, MD, have reviewed all documentation for this visit. The documentation on 12/16/22 for the exam, diagnosis, procedures, and orders are all accurate and complete.   IF YOU HAVE BEEN REFERRED TO A SPECIALIST, IT MAY TAKE 1-2 WEEKS TO SCHEDULE/PROCESS THE REFERRAL. IF YOU HAVE NOT HEARD FROM US/SPECIALIST IN TWO WEEKS, PLEASE GIVE [place] A CALL AT [phone].   THE PATIENT IS ENCOURAGED TO PRACTICE SOCIAL DISTANCING DUE TO THE COVID-19 PANDEMIC.

## 2022-12-16 NOTE — Patient Instructions (Signed)

## 2022-12-19 LAB — CBC
Hematocrit: 34.6 % (ref 34.0–46.6)
Hemoglobin: 11 g/dL — ABNORMAL LOW (ref 11.1–15.9)
MCH: 29.3 pg (ref 26.6–33.0)
MCHC: 31.8 g/dL (ref 31.5–35.7)
MCV: 92 fL (ref 79–97)
Platelets: 262 10*3/uL (ref 150–450)
RBC: 3.75 x10E6/uL — ABNORMAL LOW (ref 3.77–5.28)
RDW: 13.6 % (ref 11.7–15.4)
WBC: 4.4 10*3/uL (ref 3.4–10.8)

## 2022-12-19 LAB — PROTEIN ELECTROPHORESIS, SERUM
A/G Ratio: 1 (ref 0.7–1.7)
Albumin ELP: 3.5 g/dL (ref 2.9–4.4)
Alpha 1: 0.3 g/dL (ref 0.0–0.4)
Alpha 2: 0.7 g/dL (ref 0.4–1.0)
Beta: 1.2 g/dL (ref 0.7–1.3)
Gamma Globulin: 1.5 g/dL (ref 0.4–1.8)
Globulin, Total: 3.5 g/dL (ref 2.2–3.9)

## 2022-12-19 LAB — HEMOGLOBIN A1C
Est. average glucose Bld gHb Est-mCnc: 126 mg/dL
Hgb A1c MFr Bld: 6 % — ABNORMAL HIGH (ref 4.8–5.6)

## 2022-12-19 LAB — CMP14+EGFR
ALT: 16 IU/L (ref 0–32)
AST: 23 IU/L (ref 0–40)
Albumin: 3.9 g/dL (ref 3.7–4.7)
Alkaline Phosphatase: 67 IU/L (ref 44–121)
BUN/Creatinine Ratio: 21 (ref 12–28)
BUN: 26 mg/dL (ref 8–27)
Bilirubin Total: 0.5 mg/dL (ref 0.0–1.2)
CO2: 25 mmol/L (ref 20–29)
Calcium: 9.2 mg/dL (ref 8.7–10.3)
Chloride: 97 mmol/L (ref 96–106)
Creatinine, Ser: 1.22 mg/dL — ABNORMAL HIGH (ref 0.57–1.00)
Globulin, Total: 3.1 g/dL (ref 1.5–4.5)
Glucose: 108 mg/dL — ABNORMAL HIGH (ref 70–99)
Potassium: 3.9 mmol/L (ref 3.5–5.2)
Sodium: 135 mmol/L (ref 134–144)
Total Protein: 7 g/dL (ref 6.0–8.5)
eGFR: 42 mL/min/{1.73_m2} — ABNORMAL LOW (ref >59)

## 2022-12-19 LAB — PHOSPHORUS: Phosphorus: 3.6 mg/dL (ref 3.0–4.3)

## 2022-12-19 LAB — PTH, INTACT AND CALCIUM: PTH: 43 pg/mL (ref 15–65)

## 2022-12-23 ENCOUNTER — Ambulatory Visit: Payer: Self-pay

## 2022-12-23 NOTE — Patient Outreach (Signed)
  Care Coordination   Follow Up Visit Note   12/23/2022 Name: Shannon Mcconnell MRN: [id number] DOB: Jan 05, 1932  Shannon Mcconnell is a 89 y.o. year old female who sees Dorothyann Peng, MD for primary care. I spoke with  Shannon Mcconnell by phone today.  What matters to the patients health and wellness today?  Patient will continue to adhere to her prescribed treatment plan for her diabetes and hypertension.     Goals Addressed               This Visit's Progress     Patient Stated     I am worried about my blood pressure (pt-stated)        Care Coordination Interventions: Evaluation of current treatment plan related to hypertension self management and patient's adherence to plan as established by provider Review of patient status, including review of consultant's reports, relevant laboratory and other test results, and medications completed Advised patient, providing education and rationale, to monitor blood pressure daily and record, calling PCP for findings outside established parameters Last practice recorded BP readings:  BP Readings from Last 3 Encounters:  12/16/22 130/78  08/12/22 (!) 140/74  04/10/22 130/68  Most recent eGFR/CrCl:  Lab Results  Component Value Date   EGFR 42 (L) 12/16/2022    No components found for: "CRCL"         Other     To use portion control and meal planning to help control diabetes        Care Coordination Interventions: Provided education to patient about basic DM disease process Review of patient status, including review of consultants reports, relevant laboratory and other test results, and medications completed Counseled on Diabetic diet, my plate method and portion control Positive reinforcement given to patient for making efforts to improve her diabetes and overall health Lab Results  Component Value Date   HGBA1C 6.0 (H) 12/16/2022      Interventions Today    Flowsheet Row Most Recent Value  Chronic Disease   Chronic disease  during today's visit Diabetes, Hypertension (HTN), Chronic Kidney Disease/End Stage Renal Disease (ESRD)  General Interventions   General Interventions Discussed/Reviewed General Interventions Reviewed, General Interventions Discussed, Labs, Doctor Visits  Doctor Visits Discussed/Reviewed Doctor Visits Discussed, Doctor Visits Reviewed, PCP  Education Interventions   Education Provided Provided Education  Provided Verbal Education On Nutrition, When to see the doctor, Labs  Labs Reviewed Kidney Function, Hgb A1c  Nutrition Interventions   Nutrition Discussed/Reviewed Carbohydrate meal planning, Portion sizes, Nutrition Discussed, Nutrition Reviewed          SDOH assessments and interventions completed:  No     Care Coordination Interventions:  Yes, provided   Follow up plan: Follow up call scheduled for 03/26/23 @1  PM    Encounter Outcome:  Pt. Visit Completed

## 2022-12-23 NOTE — Patient Instructions (Signed)
Visit Information  Thank you for taking time to visit with me today. Please don't hesitate to contact me if I can be of assistance to you.   Following are the goals we discussed today:   Goals Addressed               This Visit's Progress     Patient Stated     I am worried about my blood pressure (pt-stated)        Care Coordination Interventions: Evaluation of current treatment plan related to hypertension self management and patient's adherence to plan as established by provider Review of patient status, including review of consultant's reports, relevant laboratory and other test results, and medications completed Advised patient, providing education and rationale, to monitor blood pressure daily and record, calling PCP for findings outside established parameters Last practice recorded BP readings:  BP Readings from Last 3 Encounters:  12/16/22 130/78  08/12/22 (!) 140/74  04/10/22 130/68  Most recent eGFR/CrCl:  Lab Results  Component Value Date   EGFR 42 (L) 12/16/2022    No components found for: "CRCL"         Other     To use portion control and meal planning to help control diabetes        Care Coordination Interventions: Provided education to patient about basic DM disease process Review of patient status, including review of consultants reports, relevant laboratory and other test results, and medications completed Counseled on Diabetic diet, my plate method and portion control Positive reinforcement given to patient for making efforts to improve her diabetes and overall health Lab Results  Component Value Date   HGBA1C 6.0 (H) 12/16/2022          Our next appointment is by telephone on 03/26/23 at 1:00 PM  Please call the care guide team at [phone] if you need to cancel or reschedule your appointment.   If you are experiencing a Mental Health or Behavioral Health Crisis or need someone to talk to, please call 1-800-273-TALK (toll free, 24 hour  hotline)  The patient verbalized understanding of instructions, educational materials, and care plan provided today and DECLINED offer to receive copy of patient instructions, educational materials, and care plan.   Delsa Sale, RN, BSN, CCM Care Management Coordinator [facility] Care Management Direct Phone: [phone]

## 2023-01-08 ENCOUNTER — Encounter: Payer: Self-pay | Admitting: Internal Medicine

## 2023-01-08 DIAGNOSIS — I700 Atherosclerosis of aorta: Secondary | ICD-10-CM | POA: Insufficient documentation

## 2023-01-08 DIAGNOSIS — M545 Low back pain, unspecified: Secondary | ICD-10-CM | POA: Insufficient documentation

## 2023-01-08 NOTE — Assessment & Plan Note (Signed)
Chronic, fair control. Goal BP<130/80.  She will c/2 amlodipine 10mg , olmesartan 40mg  and hydrochlorothiazide 12.5mg  daily. Advised to follow low sodium diet. Will check on Renal referral, she has yet to hear about appt.

## 2023-01-08 NOTE — Assessment & Plan Note (Signed)
Chronic, still awaiting Renal referral. I will have referral coordinator look into this. Thus far, she has been ablel to control DM with lifestyle modification. She will rto in four months for re-evaluation.

## 2023-01-08 NOTE — Assessment & Plan Note (Signed)
Chronic, LDL goal < 70.  She will c/w atorvastatin 80mg  daily. Importance of medication/dietary compliance was discussed with the patient.

## 2023-01-08 NOTE — Assessment & Plan Note (Signed)
Chronic, due to underlying DM and aortic atherosclerosis, LDL goal <70.  She will c/w atorvastatin 80mg  daily.

## 2023-01-08 NOTE — Assessment & Plan Note (Signed)
She has lost five pounds since Feb 2024. She is encouraged to supplement her meals with Boost/Ensure and healthy snacks. Will recheck in four months.

## 2023-01-08 NOTE — Assessment & Plan Note (Signed)
Possibly due to constipation, she does have h/o this. Encouraged to stay well hydrated and optimize fiber intake.

## 2023-01-15 ENCOUNTER — Encounter: Payer: Medicare PPO | Admitting: Internal Medicine

## 2023-01-19 ENCOUNTER — Other Ambulatory Visit: Payer: Self-pay | Admitting: Internal Medicine

## 2023-01-19 DIAGNOSIS — N1831 Chronic kidney disease, stage 3a: Secondary | ICD-10-CM

## 2023-01-26 ENCOUNTER — Other Ambulatory Visit: Payer: Self-pay

## 2023-01-26 DIAGNOSIS — N1831 Chronic kidney disease, stage 3a: Secondary | ICD-10-CM

## 2023-01-26 MED ORDER — RELION TRUE METRIX TEST STRIPS VI STRP
ORAL_STRIP | 1 refills | Status: DC
Start: 2023-01-26 — End: 2023-06-23

## 2023-02-03 DIAGNOSIS — N2581 Secondary hyperparathyroidism of renal origin: Secondary | ICD-10-CM | POA: Diagnosis not present

## 2023-02-03 DIAGNOSIS — E1122 Type 2 diabetes mellitus with diabetic chronic kidney disease: Secondary | ICD-10-CM | POA: Diagnosis not present

## 2023-02-03 DIAGNOSIS — E785 Hyperlipidemia, unspecified: Secondary | ICD-10-CM | POA: Diagnosis not present

## 2023-02-03 DIAGNOSIS — I129 Hypertensive chronic kidney disease with stage 1 through stage 4 chronic kidney disease, or unspecified chronic kidney disease: Secondary | ICD-10-CM | POA: Diagnosis not present

## 2023-02-03 DIAGNOSIS — N1832 Chronic kidney disease, stage 3b: Secondary | ICD-10-CM | POA: Diagnosis not present

## 2023-02-03 DIAGNOSIS — R634 Abnormal weight loss: Secondary | ICD-10-CM | POA: Diagnosis not present

## 2023-02-03 DIAGNOSIS — N281 Cyst of kidney, acquired: Secondary | ICD-10-CM | POA: Diagnosis not present

## 2023-02-04 LAB — LAB REPORT - SCANNED
Albumin, Urine POC: 379.6
Creatinine, POC: 118.4 mg/dL
EGFR: 35
Microalb Creat Ratio: 321

## 2023-03-24 ENCOUNTER — Ambulatory Visit: Payer: Medicare PPO | Admitting: Internal Medicine

## 2023-03-24 ENCOUNTER — Encounter: Payer: Self-pay | Admitting: Internal Medicine

## 2023-03-24 VITALS — BP 108/80 | HR 92 | Temp 98.2°F | Ht 64.0 in | Wt 135.8 lb

## 2023-03-24 DIAGNOSIS — Z23 Encounter for immunization: Secondary | ICD-10-CM | POA: Diagnosis not present

## 2023-03-24 DIAGNOSIS — R9389 Abnormal findings on diagnostic imaging of other specified body structures: Secondary | ICD-10-CM

## 2023-03-24 DIAGNOSIS — N1832 Chronic kidney disease, stage 3b: Secondary | ICD-10-CM | POA: Diagnosis not present

## 2023-03-24 DIAGNOSIS — Z0000 Encounter for general adult medical examination without abnormal findings: Secondary | ICD-10-CM | POA: Diagnosis not present

## 2023-03-24 DIAGNOSIS — E7800 Pure hypercholesterolemia, unspecified: Secondary | ICD-10-CM

## 2023-03-24 DIAGNOSIS — I1310 Hypertensive heart and chronic kidney disease without heart failure, with stage 1 through stage 4 chronic kidney disease, or unspecified chronic kidney disease: Secondary | ICD-10-CM

## 2023-03-24 DIAGNOSIS — I700 Atherosclerosis of aorta: Secondary | ICD-10-CM

## 2023-03-24 DIAGNOSIS — E1122 Type 2 diabetes mellitus with diabetic chronic kidney disease: Secondary | ICD-10-CM | POA: Diagnosis not present

## 2023-03-24 LAB — POCT URINALYSIS DIPSTICK
Bilirubin, UA: NEGATIVE
Blood, UA: NEGATIVE
Glucose, UA: NEGATIVE
Ketones, UA: NEGATIVE
Leukocytes, UA: NEGATIVE
Nitrite, UA: NEGATIVE
Protein, UA: NEGATIVE
Spec Grav, UA: 1.02 (ref 1.010–1.025)
Urobilinogen, UA: 0.2 U/dL
pH, UA: 6.5 (ref 5.0–8.0)

## 2023-03-24 NOTE — Progress Notes (Signed)
I,Victoria T Deloria Lair, CMA,acting as a Neurosurgeon for Gwynneth Aliment, MD.,have documented all relevant documentation on the behalf of Gwynneth Aliment, MD,as directed by  Gwynneth Aliment, MD while in the presence of Gwynneth Aliment, MD.  Subjective:    Patient ID: Shannon Mcconnell , female    DOB: 1932-04-18 , 89 y.o.   MRN: [id number]  Chief Complaint  Patient presents with   Annual Exam   Hypertension   Diabetes   Hyperlipidemia    HPI  The patient is here for a physical exam.  She is accompanied by her daughter.  She is no longer followed by GYN. She has no specific concerns or complaints at this time.  She reports compliance with meds. She denies headaches, chest pain and shortness of breath.   Hypertension This is a chronic problem. The current episode started more than 1 year ago. The problem has been gradually improving since onset. The problem is controlled. Pertinent negatives include no blurred vision. Past treatments include calcium channel blockers, angiotensin blockers and diuretics. The current treatment provides moderate improvement. There are no compliance problems.  Hypertensive end-organ damage includes kidney disease.  Diabetes She presents for her follow-up diabetic visit. She has type 2 diabetes mellitus. Pertinent negatives for diabetes include no blurred vision. She has not had a previous visit with a dietitian. She participates in exercise intermittently. An ACE inhibitor/angiotensin II receptor blocker is being taken.     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 PATIENT SWITCHED TO TAKING DOSE IN THE EVENING PER DR. Boruch Manuele, Disp: 90 tablet, Rfl: 2   atorvastatin (LIPITOR) 80 MG tablet, Take 1 tablet (80 mg total) by mouth daily., Disp: 90 tablet, Rfl: 1   Blood Glucose Monitoring Suppl (TRUE METRIX METER) w/Device KIT, USE AS DIRECTED TO CHECK BLOOD SUGAR TWICE DAILY DX: E11.65, Disp: 1 kit, Rfl: 1   Cholecalciferol (VITAMIN D3) 50 MCG (2000 UT) capsule, TAKE 1 CAPSULE BY MOUTH TWICE DAILY **DISCONTINUE  VITAMIN  D2  50,000  IU**, Disp: 60 capsule, Rfl: 0   EQ ASPIRIN ADULT LOW DOSE 81 MG tablet, TAKE 1 TABLET BY MOUTH ONCE DAILY. SWALLOW WHOLE., Disp: 90 tablet, Rfl: 0   glucose blood (RELION TRUE METRIX TEST STRIPS) test strip, USE ONCE DAILY TO CHECK BLOOD GLUCOSE, Disp: 50 each, Rfl: 1   hydrochlorothiazide (MICROZIDE) 12.5 MG capsule, Take 1 capsule (12.5 mg total) by mouth daily., Disp: 90 capsule, Rfl: 1   Menthol, Topical Analgesic, (ICY HOT EX), Apply 1 Application topically daily as needed (knee pain)., Disp: , Rfl:    Multiple Vitamins-Minerals (ADULT ONE DAILY GUMMIES) CHEW, Chew 1 tablet by mouth every morning., Disp: , Rfl:    olmesartan (BENICAR) 40 MG tablet, Take 1 tablet (40 mg total) by mouth daily., Disp: 90 tablet, Rfl: 1   OVER THE COUNTER MEDICATION, Take 1 tablet by mouth every morning. "Laminine" - amino acid supplement, Disp: , Rfl:    benzonatate (TESSALON PERLES) 100 MG capsule, Take 1 capsule (100 mg  total) by mouth 3 (three) times daily as needed for cough. (Patient not taking: Reported on 03/24/2023), Disp: 30 capsule, Rfl: 1   No Known Allergies    The patient states she uses post menopausal status for birth control. No LMP recorded. Patient has had a hysterectomy.. Negative for Dysmenorrhea. Negative for: breast discharge, breast lump(s), breast pain and breast self exam. Associated symptoms include abnormal vaginal bleeding. Pertinent negatives include abnormal bleeding (hematology), anxiety, decreased  libido, depression, difficulty falling sleep, dyspareunia, history of infertility, nocturia, sexual dysfunction, sleep disturbances, urinary incontinence, urinary urgency, vaginal discharge and vaginal itching. Diet regular.The patient states her exercise level is    . The patient's tobacco use is:  Social History   Tobacco Use  Smoking Status Never  Smokeless Tobacco Never  . She has been exposed to passive smoke. The patient's alcohol use is:  Social History   Substance and Sexual Activity  Alcohol Use Never    Review of Systems  Constitutional: Negative.   HENT: Negative.    Eyes: Negative.  Negative for blurred vision.  Respiratory: Negative.    Cardiovascular: Negative.   Gastrointestinal: Negative.   Endocrine: Negative.   Genitourinary: Negative.   Musculoskeletal: Negative.   Skin: Negative.   Allergic/Immunologic: Negative.   Neurological: Negative.   Hematological: Negative.   Psychiatric/Behavioral: Negative.       Today's Vitals   03/24/23 1406  BP: 108/80  Pulse: 92  Temp: 98.2 F (36.8 C)  SpO2: 98%  Weight: 135 lb 12.8 oz (61.6 kg)  Height: 5\' 4"  (1.626 m)   Body mass index is 23.31 kg/m.  Wt Readings from Last 3 Encounters:  03/24/23 135 lb 12.8 oz (61.6 kg)  12/16/22 136 lb (61.7 kg)  09/10/22 142 lb (64.4 kg)     Objective:  Physical Exam Vitals and nursing note reviewed.  Constitutional:      Appearance: Normal appearance.  HENT:     Head: Normocephalic and atraumatic.     Right Ear: Tympanic membrane, ear canal and external ear normal.     Left Ear: Tympanic membrane, ear canal and external ear normal.     Nose:     Comments: Masked     Mouth/Throat:     Comments: Masked  Eyes:     Extraocular Movements: Extraocular movements intact.     Conjunctiva/sclera: Conjunctivae normal.     Pupils: Pupils are equal, round, and reactive to light.  Cardiovascular:     Rate and Rhythm: Normal rate and regular rhythm.     Pulses: Normal  pulses.     Heart sounds: Normal heart sounds.  Pulmonary:     Effort: Pulmonary effort is normal.     Breath sounds: Normal breath sounds.  Chest:  Breasts:    Tanner Score is 5.     Right: Normal.     Left: Absent.     Comments: S/p left mastectomy Abdominal:     General: Abdomen is flat. Bowel sounds are normal.     Palpations: Abdomen is soft.  Genitourinary:    Comments: deferred Musculoskeletal:        General: Normal range of motion.     Cervical back: Normal range of motion and neck supple.     Comments: There is soft tissue mass,mobile , nontender - lower back, lateral to spine  Skin:    General: Skin is warm and dry.  Neurological:     General: No focal deficit present.     Mental Status:  She is alert and oriented to person, place, and time.  Psychiatric:        Mood and Affect: Mood normal.        Behavior: Behavior normal.         Assessment And Plan:     Encounter for annual health examination Assessment & Plan: A full exam was performed.  Importance of monthly self breast exams was discussed with the patient.  She is advised to get 30-45 minutes of regular exercise, no less than four to five days per week. Both weight-bearing and aerobic exercises are recommended.  She is advised to follow a healthy diet with at least six fruits/veggies per day, decrease intake of red meat and other saturated fats and to increase fish intake to twice weekly.  Meats/fish should not be fried -- baked, boiled or broiled is preferable. It is also important to cut back on your sugar intake.  Be sure to read labels - try to avoid anything with added sugar, high fructose corn syrup or other sweeteners.  If you must use a sweetener, you can try stevia or monkfruit.  It is also important to avoid artificially sweetened foods/beverages and diet drinks. Lastly, wear SPF 50 sunscreen on exposed skin and when in direct sunlight for an extended period of time.  Be sure to avoid fast food  restaurants and aim for at least 60 ounces of water daily.       Hypertensive heart and renal disease with renal failure, stage 1 through stage 4 or unspecified chronic kidney disease, without heart failure Assessment & Plan: Chronic, well controlledl. Goal BP<130/80.  She will c/w amlodipine 10mg , olmesartan 40mg  and hydrochlorothiazide 12.5mg  daily. Advised to follow low sodium diet. Will check on Renal referral, she has yet to hear about appt.   Orders: -     POCT urinalysis dipstick -     Microalbumin / creatinine urine ratio -     EKG 12-Lead -     CBC -     Hepatic function panel  Aortic atherosclerosis (HCC) Assessment & Plan: Chronic, LDL goal < 70.  She will c/w atorvastatin 80mg  daily. Importance of medication/dietary compliance was discussed with the patient.    Type 2 diabetes mellitus with stage 3b chronic kidney disease, without long-term current use of insulin (HCC) Assessment & Plan: Chronic, diabetic foot exam was performed. She has controlled her DM with lifestyle changes. I DISCUSSED WITH THE PATIENT AT LENGTH REGARDING THE GOALS OF GLYCEMIC CONTROL AND POSSIBLE LONG-TERM COMPLICATIONS.  I  ALSO STRESSED THE IMPORTANCE OF COMPLIANCE WITH HOME GLUCOSE MONITORING, DIETARY RESTRICTIONS INCLUDING AVOIDANCE OF SUGARY DRINKS/PROCESSED FOODS,  ALONG WITH REGULAR EXERCISE.  I  ALSO STRESSED THE IMPORTANCE OF ANNUAL EYE EXAMS, SELF FOOT CARE AND COMPLIANCE WITH OFFICE VISITS.   Orders: -     POCT urinalysis dipstick -     Microalbumin / creatinine urine ratio -     EKG 12-Lead -     Hemoglobin A1c  Pure hypercholesterolemia Assessment & Plan: Chronic, due to underlying DM and aortic atherosclerosis, LDL goal <70.  She will c/w atorvastatin 80mg  daily.   Orders: -     Hepatic function panel  Abnormal CT scan, chest -     CT CHEST WO CONTRAST; Future  Immunization due -     Flu Vaccine Trivalent High Dose (Fluad)     Return Jan - dm check, for 1 year physical,  May 2025 - dm check. Patient was given opportunity to  ask questions. Patient verbalized understanding of the plan and was able to repeat key elements of the plan. All questions were answered to their satisfaction.    I, Gwynneth Aliment, MD, have reviewed all documentation for this visit. The documentation on 03/24/23 for the exam, diagnosis, procedures, and orders are all accurate and complete.

## 2023-03-24 NOTE — Patient Instructions (Signed)

## 2023-03-24 NOTE — Assessment & Plan Note (Signed)
Chronic, well controlledl. Goal BP<130/80.  She will c/w amlodipine 10mg , olmesartan 40mg  and hydrochlorothiazide 12.5mg  daily. Advised to follow low sodium diet. Will check on Renal referral, she has yet to hear about appt.

## 2023-03-24 NOTE — Assessment & Plan Note (Signed)
Chronic, diabetic foot exam was performed. She has controlled her DM with lifestyle changes. I DISCUSSED WITH THE PATIENT AT LENGTH REGARDING THE GOALS OF GLYCEMIC CONTROL AND POSSIBLE LONG-TERM COMPLICATIONS.  I  ALSO STRESSED THE IMPORTANCE OF COMPLIANCE WITH HOME GLUCOSE MONITORING, DIETARY RESTRICTIONS INCLUDING AVOIDANCE OF SUGARY DRINKS/PROCESSED FOODS,  ALONG WITH REGULAR EXERCISE.  I  ALSO STRESSED THE IMPORTANCE OF ANNUAL EYE EXAMS, SELF FOOT CARE AND COMPLIANCE WITH OFFICE VISITS.

## 2023-03-24 NOTE — Assessment & Plan Note (Signed)

## 2023-03-25 LAB — CBC
Hematocrit: 34.5 % (ref 34.0–46.6)
Hemoglobin: 11.2 g/dL (ref 11.1–15.9)
MCH: 30.6 pg (ref 26.6–33.0)
MCHC: 32.5 g/dL (ref 31.5–35.7)
MCV: 94 fL (ref 79–97)
Platelets: 252 10*3/uL (ref 150–450)
RBC: 3.66 x10E6/uL — ABNORMAL LOW (ref 3.77–5.28)
RDW: 13.2 % (ref 11.7–15.4)
WBC: 4.9 10*3/uL (ref 3.4–10.8)

## 2023-03-25 LAB — HEMOGLOBIN A1C
Est. average glucose Bld gHb Est-mCnc: 126 mg/dL
Hgb A1c MFr Bld: 6 % — ABNORMAL HIGH (ref 4.8–5.6)

## 2023-03-25 LAB — HEPATIC FUNCTION PANEL
ALT: 16 [IU]/L (ref 0–32)
AST: 28 [IU]/L (ref 0–40)
Albumin: 4 g/dL (ref 3.7–4.7)
Alkaline Phosphatase: 65 [IU]/L (ref 44–121)
Bilirubin Total: 0.4 mg/dL (ref 0.0–1.2)
Bilirubin, Direct: 0.15 mg/dL (ref 0.00–0.40)
Total Protein: 7.3 g/dL (ref 6.0–8.5)

## 2023-03-25 LAB — MICROALBUMIN / CREATININE URINE RATIO
Creatinine, Urine: 153.6 mg/dL
Microalb/Creat Ratio: 149 mg/g{creat} — ABNORMAL HIGH (ref 0–29)
Microalbumin, Urine: 229.6 ug/mL

## 2023-03-26 ENCOUNTER — Ambulatory Visit: Payer: Self-pay

## 2023-03-26 NOTE — Patient Outreach (Signed)
Care Coordination   Follow Up Visit Note   03/26/2023 Name: Shannon Mcconnell MRN: [id number] DOB: 1932/01/04  Shannon Mcconnell is a 89 y.o. year old female who sees Dorothyann Peng, MD for primary care. I spoke with daughter Shannon Mcconnell by phone today.  What matters to the patients health and wellness today?  Patient would like to continue to keep her diabetes under good control and add protein supplementation to help with adequate nutrition.     Goals Addressed               This Visit's Progress     Patient Stated     COMPLETED: I am worried about my blood pressure (pt-stated)        Care Coordination Interventions: Evaluation of current treatment plan related to hypertension self management and patient's adherence to plan as established by provider Review of patient status, including review of consultant's reports, relevant laboratory and other test results, and medications completed Advised patient, providing education and rationale, to monitor blood pressure daily and record, calling PCP for findings outside established parameters Last practice recorded BP readings:  BP Readings from Last 3 Encounters:  03/24/23 108/80  12/16/22 130/78  08/12/22 (!) 140/74   Most recent eGFR/CrCl:  Lab Results  Component Value Date   EGFR 35.0 02/04/2023    No components found for: "CRCL"      Other     To use portion control and meal planning to help control diabetes   On track     Care Coordination Interventions: Evaluation of current treatment plan related to type 2 Diabetes and patient's adherence to plan as established by provider Review of patient status, including review of consultants reports, relevant laboratory and other test results, and medications completed Counseled on Diabetic diet, my plate method and portion control Educated daughter about best protein supplement options to provide patient to include Glucerna  Mailed Glucerna coupons; Mailed printed educational  materials related to Diabetes Management Discussed plans with patient for ongoing care coordination follow up and provided patient with direct contact information for nurse care coordinator Assessed patient's understanding of A1c goal: <7% Lab Results  Component Value Date   HGBA1C 6.0 (H) 03/24/2023      Interventions Today    Flowsheet Row Most Recent Value  Chronic Disease   Chronic disease during today's visit Diabetes, Hypertension (HTN), Chronic Kidney Disease/End Stage Renal Disease (ESRD)  General Interventions   General Interventions Discussed/Reviewed General Interventions Discussed, General Interventions Reviewed, Labs, Doctor Visits, Durable Medical Equipment (DME)  Doctor Visits Discussed/Reviewed Doctor Visits Reviewed, PCP, Doctor Visits Discussed  Durable Medical Equipment (DME) BP Cuff  Exercise Interventions   Exercise Discussed/Reviewed Exercise Reviewed, Exercise Discussed, Physical Activity  Physical Activity Discussed/Reviewed Types of exercise, Physical Activity Reviewed, Physical Activity Discussed  Education Interventions   Education Provided Provided Printed Education, Provided Education  Provided Verbal Education On Nutrition, When to see the doctor, Exercise, Labs, Eye Care, Foot Care  Labs Reviewed Kidney Function, Hgb A1c  Nutrition Interventions   Nutrition Discussed/Reviewed Nutrition Reviewed, Nutrition Discussed, Portion sizes, Supplemental nutrition, Increasing proteins, Decreasing salt          SDOH assessments and interventions completed:  No     Care Coordination Interventions:  Yes, provided   Follow up plan: Follow up call scheduled for 06/23/23 @1 :00 PM    Encounter Outcome:  Patient Visit Completed

## 2023-03-26 NOTE — Patient Instructions (Signed)
Visit Information  Thank you for taking time to visit with me today. Please don't hesitate to contact me if I can be of assistance to you.   Following are the goals we discussed today:   Goals Addressed               This Visit's Progress     Patient Stated     COMPLETED: I am worried about my blood pressure (pt-stated)        Care Coordination Interventions: Evaluation of current treatment plan related to hypertension self management and patient's adherence to plan as established by provider Review of patient status, including review of consultant's reports, relevant laboratory and other test results, and medications completed Advised patient, providing education and rationale, to monitor blood pressure daily and record, calling PCP for findings outside established parameters Last practice recorded BP readings:  BP Readings from Last 3 Encounters:  03/24/23 108/80  12/16/22 130/78  08/12/22 (!) 140/74   Most recent eGFR/CrCl:  Lab Results  Component Value Date   EGFR 35.0 02/04/2023    No components found for: "CRCL"       Other     To use portion control and meal planning to help control diabetes   On track     Care Coordination Interventions: Evaluation of current treatment plan related to type 2 Diabetes and patient's adherence to plan as established by provider Review of patient status, including review of consultants reports, relevant laboratory and other test results, and medications completed Counseled on Diabetic diet, my plate method and portion control Educated daughter about best protein supplement options to provide patient to include Glucerna  Mailed Glucerna coupons; Mailed printed educational materials related to Diabetes Management Discussed plans with patient for ongoing care coordination follow up and provided patient with direct contact information for nurse care coordinator Assessed patient's understanding of A1c goal: <7% Lab Results  Component Value  Date   HGBA1C 6.0 (H) 03/24/2023           Our next appointment is by telephone on 06/23/23 at 1:00 PM  Please call the care guide team at [phone] if you need to cancel or reschedule your appointment.   If you are experiencing a Mental Health or Behavioral Health Crisis or need someone to talk to, please call 1-800-273-TALK (toll free, 24 hour hotline)  The patient verbalized understanding of instructions, educational materials, and care plan provided today and DECLINED offer to receive copy of patient instructions, educational materials, and care plan.   Delsa Sale RN BSN CCM [place]  [facility], [facility] Nurse Care Coordinator  Direct Dial: [phone] Website: [email or web address]

## 2023-04-05 DIAGNOSIS — R9389 Abnormal findings on diagnostic imaging of other specified body structures: Secondary | ICD-10-CM | POA: Insufficient documentation

## 2023-04-05 NOTE — Assessment & Plan Note (Signed)
Chronic, due to underlying DM and aortic atherosclerosis, LDL goal <70.  She will c/w atorvastatin 80mg  daily.

## 2023-04-05 NOTE — Assessment & Plan Note (Signed)
Chronic, LDL goal < 70.  She will c/w atorvastatin 80mg  daily. Importance of medication/dietary compliance was discussed with the patient.

## 2023-04-25 ENCOUNTER — Other Ambulatory Visit: Payer: Self-pay | Admitting: Internal Medicine

## 2023-05-04 ENCOUNTER — Other Ambulatory Visit: Payer: Self-pay | Admitting: Internal Medicine

## 2023-06-20 ENCOUNTER — Other Ambulatory Visit: Payer: Self-pay | Admitting: Internal Medicine

## 2023-06-20 DIAGNOSIS — N1831 Chronic kidney disease, stage 3a: Secondary | ICD-10-CM

## 2023-06-23 ENCOUNTER — Other Ambulatory Visit: Payer: Self-pay

## 2023-06-23 ENCOUNTER — Ambulatory Visit: Payer: Self-pay

## 2023-06-23 DIAGNOSIS — E1122 Type 2 diabetes mellitus with diabetic chronic kidney disease: Secondary | ICD-10-CM

## 2023-06-23 MED ORDER — RELION TRUE METRIX TEST STRIPS VI STRP: ORAL_STRIP | 1 refills | Status: DC

## 2023-06-23 NOTE — Patient Outreach (Signed)
  Care Coordination   Follow Up Visit Note   06/23/2023 Name: Shannon Mcconnell MRN: [id number] DOB: 10-31-1931  Shannon Mcconnell is a 89 y.o. year old female who sees Jarold Medici, MD for primary care. I spoke with daughter Olam Pereyra by phone today.  What matters to the patients health and wellness today?  Patient would like to continue to be independent with self care.     Goals Addressed             This Visit's Progress    To use portion control and meal planning to help control diabetes   On track    Care Coordination Interventions: Completed successful outbound call with daughter Olam (patient provided verbal consent) Evaluation of current treatment plan related to type 2 Diabetes and patient's adherence to plan as established by provider Discussed patient is taking a multivitamin, this has improved her appetite Reviewed medications with patient and discussed importance of medication adherence Determined daughter Olam is providing meal preps for patient and this includes a well balanced diet with portion control  Assessed for SDOH barriers, none identified at this time Discussed plans with daughter for ongoing care coordination follow up and encouraged daughter to reach out if assistance is needed prior to next scheduled nurse call  Assessed patient's understanding of A1c goal: <7% Lab Results  Component Value Date   HGBA1C 6.0 (H) 03/24/2023       Interventions Today    Flowsheet Row Most Recent Value  Chronic Disease   Chronic disease during today's visit Chronic Kidney Disease/End Stage Renal Disease (ESRD)  General Interventions   General Interventions Discussed/Reviewed General Interventions Discussed, General Interventions Reviewed, Doctor Visits  Doctor Visits Discussed/Reviewed PCP, Doctor Visits Reviewed, Doctor Visits Discussed, Specialist, Annual Wellness Visits  Exercise Interventions   Exercise Discussed/Reviewed Weight Managment  Weight Management Weight  maintenance  Education Interventions   Education Provided Provided Education  Provided Verbal Education On Nutrition, Labs, Medication, When to see the doctor  Labs Reviewed Kidney Function  Nutrition Interventions   Nutrition Discussed/Reviewed Nutrition Discussed, Nutrition Reviewed, Increasing proteins, Fluid intake  Pharmacy Interventions   Pharmacy Dicussed/Reviewed Pharmacy Topics Discussed, Pharmacy Topics Reviewed          SDOH assessments and interventions completed:  Yes  SDOH Interventions Today    Flowsheet Row Most Recent Value  SDOH Interventions   Transportation Interventions Intervention Not Indicated        Care Coordination Interventions:  Yes, provided   Follow up plan: Follow up call scheduled for 08/21/23 @10 :30 AM    Encounter Outcome:  Patient Visit Completed

## 2023-06-23 NOTE — Patient Instructions (Signed)
 Visit Information  Thank you for taking time to visit with me today. Please don't hesitate to contact me if I can be of assistance to you.   Following are the goals we discussed today:   Goals Addressed             This Visit's Progress    To use portion control and meal planning to help control diabetes   On track    Care Coordination Interventions: Completed successful outbound call with daughter Olam (patient provided verbal consent) Evaluation of current treatment plan related to type 2 Diabetes and patient's adherence to plan as established by provider Discussed patient is taking a multivitamin, this has improved her appetite Reviewed medications with patient and discussed importance of medication adherence Determined daughter Olam is providing meal preps for patient and this includes a well balanced diet with portion control  Assessed for SDOH barriers, none identified at this time Discussed plans with daughter for ongoing care coordination follow up and encouraged daughter to reach out if assistance is needed prior to next scheduled nurse call  Assessed patient's understanding of A1c goal: <7% Lab Results  Component Value Date   HGBA1C 6.0 (H) 03/24/2023           Our next appointment is by telephone on 08/21/23 at 10:30 AM  Please call the care guide team at [phone] if you need to cancel or reschedule your appointment.   If you are experiencing a Mental Health or Behavioral Health Crisis or need someone to talk to, please call 1-800-273-TALK (toll free, 24 hour hotline)  The patient verbalized understanding of instructions, educational materials, and care plan provided today and DECLINED offer to receive copy of patient instructions, educational materials, and care plan.   Clayborne Ly RN BSN CCM [place]  [facility], [facility] Health Nurse Care Coordinator  Direct Dial: [phone] Website: [email or web address]

## 2023-06-26 ENCOUNTER — Other Ambulatory Visit: Payer: Self-pay | Admitting: Internal Medicine

## 2023-06-26 DIAGNOSIS — I1310 Hypertensive heart and chronic kidney disease without heart failure, with stage 1 through stage 4 chronic kidney disease, or unspecified chronic kidney disease: Secondary | ICD-10-CM

## 2023-07-10 ENCOUNTER — Other Ambulatory Visit: Payer: Self-pay | Admitting: Internal Medicine

## 2023-07-10 DIAGNOSIS — E1122 Type 2 diabetes mellitus with diabetic chronic kidney disease: Secondary | ICD-10-CM

## 2023-07-29 ENCOUNTER — Other Ambulatory Visit: Payer: Self-pay | Admitting: Internal Medicine

## 2023-08-04 ENCOUNTER — Other Ambulatory Visit: Payer: Self-pay

## 2023-08-04 MED ORDER — ASPIRIN 81 MG PO TBEC: 81.0000 mg | DELAYED_RELEASE_TABLET | Freq: Every day | ORAL | 2 refills | Status: AC

## 2023-08-10 ENCOUNTER — Other Ambulatory Visit: Payer: Self-pay | Admitting: Internal Medicine

## 2023-08-10 DIAGNOSIS — N1831 Chronic kidney disease, stage 3a: Secondary | ICD-10-CM

## 2023-08-12 DIAGNOSIS — N281 Cyst of kidney, acquired: Secondary | ICD-10-CM | POA: Diagnosis not present

## 2023-08-12 DIAGNOSIS — I129 Hypertensive chronic kidney disease with stage 1 through stage 4 chronic kidney disease, or unspecified chronic kidney disease: Secondary | ICD-10-CM | POA: Diagnosis not present

## 2023-08-12 DIAGNOSIS — E1122 Type 2 diabetes mellitus with diabetic chronic kidney disease: Secondary | ICD-10-CM | POA: Diagnosis not present

## 2023-08-12 DIAGNOSIS — N1832 Chronic kidney disease, stage 3b: Secondary | ICD-10-CM | POA: Diagnosis not present

## 2023-08-12 DIAGNOSIS — E785 Hyperlipidemia, unspecified: Secondary | ICD-10-CM | POA: Diagnosis not present

## 2023-08-12 LAB — LAB REPORT - SCANNED
Albumin, Urine POC: 170.9
Creatinine, POC: 74 mg/dL
EGFR: 38
Microalb Creat Ratio: 231

## 2023-08-21 ENCOUNTER — Ambulatory Visit: Payer: Self-pay

## 2023-08-21 NOTE — Patient Outreach (Signed)
 Care Coordination   Follow Up Visit Note   08/21/2023 Name: Britanni Yarde MRN: [id number] DOB: July 31, 1931  Vernetta Dizdarevic is a 89 y.o. year old female who sees Dorothyann Peng, MD for primary care. I spoke with daughter Laqueta Carina by phone today.  What matters to the patients health and wellness today?  Patient/daughter would like to continue managing her chronic health conditions in order to maintain optimal quality of life.     Goals Addressed             This Visit's Progress    COMPLETED: To use portion control and meal planning to help control diabetes       Care Coordination Interventions: Completed successful outbound call with daughter Laqueta Carina  Provided education to patient about basic DM disease process Review of patient status, including review of consultants reports, relevant laboratory and other test results, and medications completed Assessed for SDOH barriers, none identified at this time   Discussed nurse case closure with daughter due to patient continues to manage her chronic health conditions without complications or concerns  Assessed patient's understanding of A1c goal: <7% Lab Results  Component Value Date   HGBA1C 6.0 (H) 03/24/2023     Interventions Today    Flowsheet Row Most Recent Value  Chronic Disease   Chronic disease during today's visit Diabetes  General Interventions   General Interventions Discussed/Reviewed General Interventions Discussed, General Interventions Reviewed, Doctor Visits  Doctor Visits Discussed/Reviewed Doctor Visits Discussed, Doctor Visits Reviewed, PCP  Exercise Interventions   Exercise Discussed/Reviewed Physical Activity, Exercise Reviewed, Exercise Discussed  Physical Activity Discussed/Reviewed Physical Activity Discussed, Physical Activity Reviewed, Types of exercise  Education Interventions   Education Provided Provided Education  Provided Verbal Education On Exercise, Labs, Medication, When to see the doctor,  Nutrition  Labs Reviewed Hgb A1c  Nutrition Interventions   Nutrition Discussed/Reviewed Nutrition Discussed, Nutrition Reviewed  Pharmacy Interventions   Pharmacy Dicussed/Reviewed Pharmacy Topics Discussed, Pharmacy Topics Reviewed  Safety Interventions   Safety Discussed/Reviewed Safety Discussed, Safety Reviewed          SDOH assessments and interventions completed:  Yes  SDOH Interventions Today    Flowsheet Row Most Recent Value  SDOH Interventions   Food Insecurity Interventions Intervention Not Indicated  Housing Interventions Intervention Not Indicated  Transportation Interventions Intervention Not Indicated  Utilities Interventions Intervention Not Indicated  Physical Activity Interventions Intervention Not Indicated        Care Coordination Interventions:  Yes, provided   Follow up plan: No further intervention required.   Encounter Outcome:  Patient Visit Completed

## 2023-08-21 NOTE — Patient Instructions (Signed)
 Visit Information  Thank you for taking time to visit with me today. Please don't hesitate to contact me if I can be of assistance to you.   Following are the goals we discussed today:   Goals Addressed             This Visit's Progress    COMPLETED: To use portion control and meal planning to help control diabetes       Care Coordination Interventions: Completed successful outbound call with daughter Laqueta Carina  Provided education to patient about basic DM disease process Review of patient status, including review of consultants reports, relevant laboratory and other test results, and medications completed Assessed for SDOH barriers, none identified at this time   Discussed nurse case closure with daughter due to patient continues to manage her chronic health conditions without complications or concerns  Assessed patient's understanding of A1c goal: <7% Lab Results  Component Value Date   HGBA1C 6.0 (H) 03/24/2023           If you are experiencing a Mental Health or Behavioral Health Crisis or need someone to talk to, please call 1-800-273-TALK (toll free, 24 hour hotline)  The patient verbalized understanding of instructions, educational materials, and care plan provided today and DECLINED offer to receive copy of patient instructions, educational materials, and care plan.   Delsa Sale RN BSN CCM [place]  [facility], [facility] Health Nurse Care Coordinator  Direct Dial: [phone] Website: [email or web address]

## 2023-08-22 LAB — HM DIABETES EYE EXAM

## 2023-09-02 ENCOUNTER — Other Ambulatory Visit: Payer: Self-pay | Admitting: Internal Medicine

## 2023-09-02 DIAGNOSIS — N1831 Chronic kidney disease, stage 3a: Secondary | ICD-10-CM

## 2023-09-16 ENCOUNTER — Ambulatory Visit (INDEPENDENT_AMBULATORY_CARE_PROVIDER_SITE_OTHER): Payer: Medicare PPO

## 2023-09-16 VITALS — BP 128/70 | HR 99 | Temp 98.7°F | Ht 64.0 in | Wt 139.4 lb

## 2023-09-16 DIAGNOSIS — Z0000 Encounter for general adult medical examination without abnormal findings: Secondary | ICD-10-CM | POA: Diagnosis not present

## 2023-09-16 NOTE — Progress Notes (Signed)
 Subjective:   Kaneshia Cater is a 89 y.o. who presents for a Medicare Wellness preventive visit.  Visit Complete: In person    Persons Participating in Visit: Patient assisted by daughter.  AWV Questionnaire: No: Patient Medicare AWV questionnaire was not completed prior to this visit.  Cardiac Risk Factors include: advanced age (>63men, >69 women);diabetes mellitus;hypertension     Objective:    Today's Vitals   09/16/23 1521  BP: 128/70  Pulse: 99  Temp: 98.7 F (37.1 C)  TempSrc: Oral  SpO2: 97%  Weight: 139 lb 6.4 oz (63.2 kg)  Height: 5\' 4"  (1.626 m)   Body mass index is 23.93 kg/m.     09/16/2023    3:35 PM 09/10/2022    3:31 PM 02/28/2022    2:35 PM 02/08/2022   10:38 AM 09/04/2021    3:34 PM 08/29/2020    3:40 PM 07/07/2019   11:27 AM  Advanced Directives  Does Patient Have a Medical Advance Directive? No No No No No No No  Would patient like information on creating a medical advance directive? Yes (MAU/Ambulatory/Procedural Areas - Information given)  No - Patient declined No - Patient declined Yes (MAU/Ambulatory/Procedural Areas - Information given)  Yes (MAU/Ambulatory/Procedural Areas - Information given)    Current Medications (verified) Outpatient Encounter Medications as of 09/16/2023  Medication Sig   amLODipine (NORVASC) 10 MG tablet TAKE 1 TABLET BY MOUTH ONCE DAILY. PATIENT SWITCHED TO TAKING DOSE IN THE EVENING PER DR. SANDERS   aspirin EC (EQ ASPIRIN ADULT LOW DOSE) 81 MG tablet Take 1 tablet (81 mg total) by mouth daily. Swallow whole.   atorvastatin (LIPITOR) 80 MG tablet Take 1 tablet (80 mg total) by mouth daily.   Blood Glucose Monitoring Suppl (TRUE METRIX METER) w/Device KIT USE AS DIRECTED TO CHECK BLOOD SUGAR TWICE DAILY DX: E11.65   Cholecalciferol (VITAMIN D3) 50 MCG (2000 UT) capsule TAKE 1 CAPSULE BY MOUTH TWICE DAILY **DISCONTINUE  VITAMIN  D2  50,000  IU**   hydrochlorothiazide (MICROZIDE) 12.5 MG capsule Take 1 capsule by mouth  once daily   Menthol, Topical Analgesic, (ICY HOT EX) Apply 1 Application topically daily as needed (knee pain).   Multiple Vitamins-Minerals (ADULT ONE DAILY GUMMIES) CHEW Chew 1 tablet by mouth every morning.   olmesartan (BENICAR) 40 MG tablet Take 1 tablet by mouth once daily   OVER THE COUNTER MEDICATION Take 1 tablet by mouth every morning. "Laminine" - amino acid supplement   RELION TRUE METRIX TEST STRIPS test strip USE AS DIRECTED TWICE DAILY   No facility-administered encounter medications on file as of 09/16/2023.    Allergies (verified) Patient has no known allergies.   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   DM (diabetes mellitus) (HCC)    Gastroesophageal reflux disease 02/08/2022   High cholesterol    Hypertension    Pure hypercholesterolemia 08/30/2018   Stroke (HCC) 01/2022   Past Surgical History:  Procedure Laterality Date   ABDOMINAL HYSTERECTOMY     mastectomy Left    Family History  Problem Relation Age of Onset   Hypertension Mother    Hypertension Father    Social History   Socioeconomic History   Marital status: Divorced    Spouse name: Not on file   Number of children: 8   Years of education: Not on file   Highest education level: 10th grade  Occupational History   Occupation: retired  Tobacco Use   Smoking  status: Never   Smokeless tobacco: Never  Vaping Use   Vaping status: Never Used  Substance and Sexual Activity   Alcohol use: Never   Drug use: Never   Sexual activity: Not Currently  Other Topics Concern   Not on file  Social History Narrative   03/18/22 dgtr [name] lives with her   Social Drivers of Health   Financial Resource Strain: Low Risk  (09/16/2023)   Overall Financial Resource Strain (CARDIA)    Difficulty of Paying Living Expenses: Not hard at all  Food Insecurity: No Food Insecurity (09/16/2023)   Hunger Vital Sign    Worried About Running Out of Food in the Last  Year: Never true    Ran Out of Food in the Last Year: Never true  Transportation Needs: No Transportation Needs (09/16/2023)   PRAPARE - Administrator, Civil Service (Medical): No    Lack of Transportation (Non-Medical): No  Physical Activity: Insufficiently Active (09/16/2023)   Exercise Vital Sign    Days of Exercise per Week: 7 days    Minutes of Exercise per Session: 20 min  Stress: No Stress Concern Present (09/16/2023)   [employer] of Occupational Health - Occupational Stress Questionnaire    Feeling of Stress : Not at all  Social Connections: Socially Isolated (09/16/2023)   Social Connection and Isolation Panel [NHANES]    Frequency of Communication with Friends and Family: More than three times a week    Frequency of Social Gatherings with Friends and Family: Twice a week    Attends Religious Services: Never    Database administrator or Organizations: No    Attends Engineer, structural: Never    Marital Status: Divorced    Tobacco Counseling Counseling given: Not Answered    Clinical Intake:  Pre-visit preparation completed: Yes  Pain : No/denies pain     Nutritional Status: BMI of 19-24  Normal Nutritional Risks: None Diabetes: Yes CBG done?: No  Lab Results  Component Value Date   HGBA1C 6.0 (H) 03/24/2023   HGBA1C 6.0 (H) 12/16/2022   HGBA1C 6.1 (H) 08/12/2022     How often do you need to have someone help you when you read instructions, pamphlets, or other written materials from your doctor or pharmacy?: 1 - Never  Interpreter Needed?: No  Information entered by :: NAllen LPN   Activities of Daily Living     09/16/2023    3:29 PM  In your present state of health, do you have any difficulty performing the following activities:  Hearing? 0  Vision? 0  Difficulty concentrating or making decisions? 0  Walking or climbing stairs? 0  Dressing or bathing? 0  Doing errands, shopping? 1  Preparing Food and eating ? N  Using the  Toilet? N  In the past six months, have you accidently leaked urine? N  Do you have problems with loss of bowel control? N  Managing your Medications? N  Managing your Finances? N  Housekeeping or managing your Housekeeping? N    Patient Care Team: Dorothyann Peng, MD as PCP - General (Internal Medicine)  Indicate any recent Medical Services you may have received from other than [facility] providers in the past year (date may be approximate).     Assessment:   This is a routine wellness examination for Milady.  Hearing/Vision screen Hearing Screening - Comments:: Denies hearing issues Vision Screening - Comments:: Regular eye exams, [facility]   Goals Addressed  This Visit's Progress    Patient Stated       09/16/2023, maintain health and gain weight       Depression Screen     09/16/2023    3:38 PM 03/24/2023    2:13 PM 12/16/2022    9:50 AM 09/10/2022    3:32 PM 08/12/2022    9:31 AM 04/10/2022   12:07 PM 02/28/2022    2:35 PM  PHQ 2/9 Scores  PHQ - 2 Score 0 0 0 0 0 0 0  PHQ- 9 Score 0 0 0        Fall Risk     09/16/2023    3:37 PM 03/24/2023    2:13 PM 12/16/2022    9:49 AM 09/10/2022    3:32 PM 08/12/2022    9:31 AM  Fall Risk   Falls in the past year? 0 0 0 0 0  Number falls in past yr: 0 0 0 0 0  Injury with Fall? 0 0 0 0 0  Risk for fall due to : Medication side effect No Fall Risks No Fall Risks Medication side effect No Fall Risks  Follow up Falls prevention discussed;Falls evaluation completed Falls evaluation completed Falls evaluation completed Falls prevention discussed;Education provided;Falls evaluation completed Falls evaluation completed    MEDICARE RISK AT HOME:  Medicare Risk at Home Any stairs in or around the home?: No If so, are there any without handrails?: No Home free of loose throw rugs in walkways, pet beds, electrical cords, etc?: Yes Adequate lighting in your home to reduce risk of falls?: Yes Life alert?: No Use of a cane,  walker or w/c?: No Grab bars in the bathroom?: Yes Shower chair or bench in shower?: Yes Elevated toilet seat or a handicapped toilet?: Yes  TIMED UP AND GO:  Was the test performed?  Yes  Length of time to ambulate 10 feet: 6 sec Gait steady and fast without use of assistive device  Cognitive Function: 6CIT completed    05/26/2018   12:12 PM  MMSE - Mini Mental State Exam  Orientation to Place 5  Attention/ Calculation 5        09/16/2023    3:38 PM 09/10/2022    3:33 PM 09/04/2021    3:37 PM 08/29/2020    3:42 PM 07/07/2019   11:31 AM  6CIT Screen  What Year? 0 points 0 points 0 points 0 points 0 points  What month? 0 points 0 points 0 points 0 points 0 points  What time? 0 points 0 points 0 points 0 points 0 points  Count back from 20 0 points 2 points 0 points 0 points 0 points  Months in reverse 2 points 0 points 4 points 4 points 4 points  Repeat phrase 4 points 6 points 6 points 10 points 0 points  Total Score 6 points 8 points 10 points 14 points 4 points    Immunizations Immunization History  Administered Date(s) Administered   Fluad Quad(high Dose 65+) 03/04/2022   Fluad Trivalent(High Dose 65+) 03/24/2023   Influenza, High Dose Seasonal PF 04/11/2018, 03/09/2019, 03/31/2021   Influenza-Unspecified 05/12/2020   Moderna Covid-19 Fall Seasonal Vaccine 72yrs & older 05/30/2022   Moderna Covid-19 Vaccine Bivalent Booster 63yrs & up 06/11/2021   Moderna SARS-COV2 Booster Vaccination 10/02/2020   Moderna Sars-Covid-2 Vaccination 06/27/2019, 07/26/2019, 04/07/2020   Pneumococcal Conjugate-13 10/17/2013, 02/04/2017   Pneumococcal Polysaccharide-23 02/05/2012   Tdap 09/04/2021   Zoster Recombinant(Shingrix) 06/08/2018, 01/08/2019    Screening Tests Health  Maintenance  Topic Date Due   FOOT EXAM  01/14/2023   COVID-19 Vaccine (6 - 2024-25 season) 02/15/2023   OPHTHALMOLOGY EXAM  05/07/2023   HEMOGLOBIN A1C  09/22/2023   INFLUENZA VACCINE  01/15/2024   Medicare  Annual Wellness (AWV)  09/15/2024   DTaP/Tdap/Td (2 - Td or Tdap) 09/05/2031   Pneumonia Vaccine 45+ Years old  Completed   DEXA SCAN  Completed   Zoster Vaccines- Shingrix  Completed   HPV VACCINES  Aged Out    Health Maintenance  Health Maintenance Due  Topic Date Due   FOOT EXAM  01/14/2023   COVID-19 Vaccine (6 - 2024-25 season) 02/15/2023   OPHTHALMOLOGY EXAM  05/07/2023   Health Maintenance Items Addressed: Covid vaccine due.  Additional Screening:  Vision Screening: Recommended annual ophthalmology exams for early detection of glaucoma and other disorders of the eye.  Dental Screening: Recommended annual dental exams for proper oral hygiene  Community Resource Referral / Chronic Care Management: CRR required this visit?  No   CCM required this visit?  No     Plan:     I have personally reviewed and noted the following in the patient's chart:   Medical and social history Use of alcohol, tobacco or illicit drugs  Current medications and supplements including opioid prescriptions. Patient is not currently taking opioid prescriptions. Functional ability and status Nutritional status Physical activity Advanced directives List of other physicians Hospitalizations, surgeries, and ER visits in previous 12 months Vitals Screenings to include cognitive, depression, and falls Referrals and appointments  In addition, I have reviewed and discussed with patient certain preventive protocols, quality metrics, and best practice recommendations. A written personalized care plan for preventive services as well as general preventive health recommendations were provided to patient.     Barb Merino, LPN   0/07/7251   After Visit Summary: (In Person-Printed) AVS printed and given to the patient  Notes: Nothing significant to report at this time.

## 2023-09-16 NOTE — Patient Instructions (Signed)
 [name] , Thank you for taking time to come for your Medicare Wellness Visit. I appreciate your ongoing commitment to your health goals. Please review the following plan we discussed and let me know if I can assist you in the future.   Referrals/Orders/Follow-Ups/Clinician Recommendations: none  This is a list of the screening recommended for you and due dates:  Health Maintenance  Topic Date Due   Complete foot exam   01/14/2023   COVID-19 Vaccine (6 - 2024-25 season) 02/15/2023   Eye exam for diabetics  05/07/2023   Hemoglobin A1C  09/22/2023   Flu Shot  01/15/2024   Medicare Annual Wellness Visit  09/15/2024   DTaP/Tdap/Td vaccine (2 - Td or Tdap) 09/05/2031   Pneumonia Vaccine  Completed   DEXA scan (bone density measurement)  Completed   Zoster (Shingles) Vaccine  Completed   HPV Vaccine  Aged Out    Advanced directives: (Provided) Advance directive discussed with you today. I have provided a copy for you to complete at home and have notarized. Once this is complete, please bring a copy in to our office so we can scan it into your chart.   Next Medicare Annual Wellness Visit scheduled for next year: No, office will schedule  insert Preventive Care attachment Insert FALL PREVENTION attachment if needed

## 2023-09-27 ENCOUNTER — Other Ambulatory Visit: Payer: Self-pay | Admitting: Internal Medicine

## 2023-09-27 DIAGNOSIS — I1310 Hypertensive heart and chronic kidney disease without heart failure, with stage 1 through stage 4 chronic kidney disease, or unspecified chronic kidney disease: Secondary | ICD-10-CM

## 2023-10-14 ENCOUNTER — Other Ambulatory Visit: Payer: Self-pay | Admitting: Internal Medicine

## 2023-10-14 DIAGNOSIS — I1310 Hypertensive heart and chronic kidney disease without heart failure, with stage 1 through stage 4 chronic kidney disease, or unspecified chronic kidney disease: Secondary | ICD-10-CM

## 2023-11-13 ENCOUNTER — Ambulatory Visit (HOSPITAL_COMMUNITY): Admission: EM | Admit: 2023-11-13 | Discharge: 2023-11-13 | Discharge status: Against Medical Advice

## 2023-11-13 DIAGNOSIS — M25562 Pain in left knee: Secondary | ICD-10-CM | POA: Diagnosis not present

## 2023-11-13 NOTE — ED Notes (Signed)
 Pt came into office today to have fluid drawn off the knee she said she had it done before in our office. Advised pt that normally she would need to go to ortho to have that done that its not something we do on a routine basis

## 2023-12-01 ENCOUNTER — Other Ambulatory Visit: Payer: Self-pay | Admitting: Internal Medicine

## 2023-12-01 DIAGNOSIS — E1122 Type 2 diabetes mellitus with diabetic chronic kidney disease: Secondary | ICD-10-CM

## 2023-12-05 ENCOUNTER — Other Ambulatory Visit: Payer: Self-pay | Admitting: Internal Medicine

## 2023-12-05 DIAGNOSIS — E7800 Pure hypercholesterolemia, unspecified: Secondary | ICD-10-CM

## 2023-12-25 ENCOUNTER — Other Ambulatory Visit: Payer: Self-pay | Admitting: Internal Medicine

## 2023-12-25 DIAGNOSIS — I1310 Hypertensive heart and chronic kidney disease without heart failure, with stage 1 through stage 4 chronic kidney disease, or unspecified chronic kidney disease: Secondary | ICD-10-CM

## 2023-12-28 ENCOUNTER — Ambulatory Visit: Payer: Self-pay | Admitting: Internal Medicine

## 2023-12-28 ENCOUNTER — Ambulatory Visit
Admission: RE | Admit: 2023-12-28 | Discharge: 2023-12-28 | Disposition: A | Discharge status: Home / Self Care | Source: Ambulatory Visit | Attending: Internal Medicine | Admitting: Internal Medicine

## 2023-12-28 ENCOUNTER — Encounter: Payer: Self-pay | Admitting: Internal Medicine

## 2023-12-28 VITALS — BP 122/84 | HR 79 | Temp 98.7°F | Ht 64.0 in | Wt 135.8 lb

## 2023-12-28 DIAGNOSIS — I1310 Hypertensive heart and chronic kidney disease without heart failure, with stage 1 through stage 4 chronic kidney disease, or unspecified chronic kidney disease: Secondary | ICD-10-CM

## 2023-12-28 DIAGNOSIS — E1122 Type 2 diabetes mellitus with diabetic chronic kidney disease: Secondary | ICD-10-CM | POA: Diagnosis not present

## 2023-12-28 DIAGNOSIS — Z6823 Body mass index (BMI) 23.0-23.9, adult: Secondary | ICD-10-CM

## 2023-12-28 DIAGNOSIS — N1832 Chronic kidney disease, stage 3b: Secondary | ICD-10-CM | POA: Diagnosis not present

## 2023-12-28 DIAGNOSIS — E7800 Pure hypercholesterolemia, unspecified: Secondary | ICD-10-CM | POA: Diagnosis not present

## 2023-12-28 DIAGNOSIS — S61001A Unspecified open wound of right thumb without damage to nail, initial encounter: Secondary | ICD-10-CM

## 2023-12-28 DIAGNOSIS — M954 Acquired deformity of chest and rib: Secondary | ICD-10-CM | POA: Diagnosis not present

## 2023-12-28 DIAGNOSIS — I700 Atherosclerosis of aorta: Secondary | ICD-10-CM

## 2023-12-28 DIAGNOSIS — R222 Localized swelling, mass and lump, trunk: Secondary | ICD-10-CM | POA: Diagnosis not present

## 2023-12-28 LAB — LIPID PANEL
Chol/HDL Ratio: 2.5 ratio (ref 0.0–4.4)
Cholesterol, Total: 146 mg/dL (ref 100–199)
HDL: 59 mg/dL (ref >39)
LDL Chol Calc (NIH): 73 mg/dL (ref 0–99)
Triglycerides: 70 mg/dL (ref 0–149)
VLDL Cholesterol Cal: 14 mg/dL (ref 5–40)

## 2023-12-28 LAB — CBC
Hematocrit: 37.1 % (ref 34.0–46.6)
Hemoglobin: 11.8 g/dL (ref 11.1–15.9)
MCH: 30.5 pg (ref 26.6–33.0)
MCHC: 31.8 g/dL (ref 31.5–35.7)
MCV: 96 fL (ref 79–97)
Platelets: 263 x10E3/uL (ref 150–450)
RBC: 3.87 x10E6/uL (ref 3.77–5.28)
RDW: 13.9 % (ref 11.7–15.4)
WBC: 5.1 x10E3/uL (ref 3.4–10.8)

## 2023-12-28 LAB — HEMOGLOBIN A1C
Est. average glucose Bld gHb Est-mCnc: 131 mg/dL
Hgb A1c MFr Bld: 6.2 % — ABNORMAL HIGH (ref 4.8–5.6)

## 2023-12-28 LAB — CMP14+EGFR
ALT: 15 IU/L (ref 0–32)
AST: 27 IU/L (ref 0–40)
Albumin: 4.1 g/dL (ref 3.6–4.6)
Alkaline Phosphatase: 69 IU/L (ref 44–121)
BUN/Creatinine Ratio: 22 (ref 12–28)
BUN: 34 mg/dL (ref 10–36)
Bilirubin Total: 0.6 mg/dL (ref 0.0–1.2)
CO2: 24 mmol/L (ref 20–29)
Calcium: 9.7 mg/dL (ref 8.7–10.3)
Chloride: 98 mmol/L (ref 96–106)
Creatinine, Ser: 1.54 mg/dL — ABNORMAL HIGH (ref 0.57–1.00)
Globulin, Total: 3.2 g/dL (ref 1.5–4.5)
Glucose: 108 mg/dL — ABNORMAL HIGH (ref 70–99)
Potassium: 4.3 mmol/L (ref 3.5–5.2)
Sodium: 138 mmol/L (ref 134–144)
Total Protein: 7.3 g/dL (ref 6.0–8.5)
eGFR: 32 mL/min/1.73 — ABNORMAL LOW (ref >59)

## 2023-12-28 LAB — TSH: TSH: 3.59 u[IU]/mL (ref 0.450–4.500)

## 2023-12-28 MED ORDER — ATORVASTATIN CALCIUM 80 MG PO TABS: 80.0000 mg | ORAL_TABLET | Freq: Every day | ORAL | 0 refills | Status: DC

## 2023-12-28 MED ORDER — AMLODIPINE BESYLATE 10 MG PO TABS: 10.0000 mg | ORAL_TABLET | Freq: Every evening | ORAL | 2 refills | Status: AC

## 2023-12-28 MED ORDER — HYDROCHLOROTHIAZIDE 12.5 MG PO CAPS: 12.5000 mg | ORAL_CAPSULE | Freq: Every day | ORAL | 2 refills | Status: AC

## 2023-12-28 NOTE — Assessment & Plan Note (Signed)
 She is within her normal weight range. Encouraged to supplement her meals with snacks.

## 2023-12-28 NOTE — Assessment & Plan Note (Addendum)
 Chronic, she has successfully controlled her DM with lifestyle changes. - check labs today - RTO in six months for re-evaluation - Chronic, she is encouraged to stay well hydrated, avoid NSAIDs and keep BP controlled to prevent progression of CKD.

## 2023-12-28 NOTE — Assessment & Plan Note (Signed)
Chronic, LDL goal < 70.  She will c/w atorvastatin 80mg  daily. Importance of medication/dietary compliance was discussed with the patient.

## 2023-12-28 NOTE — Assessment & Plan Note (Signed)
 Chronic, well controlled. She will continue with olmesartan , amlodipine  and hydrochlorothiazide . Encouraged to continue to follow a low sodium diet.

## 2023-12-28 NOTE — Progress Notes (Signed)
 I,[name], CMA,acting as a Neurosurgeon for [name], MD.,have documented all relevant documentation on the behalf of [name], MD,as directed by  [name], MD while in the presence of [name], MD.  Subjective:  Patient ID: Shannon Mcconnell , female    DOB: 10-30-1931 , 89 y.o.   MRN: [id number]  Chief Complaint  Patient presents with   Mass    Patient presents today for mass on the left side of her chest. Denies pain, discomfort or drainage. She states noticing this randomly one day while feeling her chest.  While here today she also has a round spot on her thumb that continuously bleeds. She's does not remember injuring her right thumb.  She reports compliance with medications. Denies headache, chest pain & sob.     HPI Discussed the use of AI scribe software for clinical note transcription with the patient, who gave verbal consent to proceed.  History of Present Illness Shannon Mcconnell is a 89 year old female with diabetes, hypertension, and kidney disease who presents with a knot on her chest.  She is accompanied by her daughter today.  A week ago, she noticed a firm, non-painful knot on the left side of her chest near where her breast was. It is small and was noticed due to its absence on the other side.  She has a history of diabetes, hypertension, and kidney disease. Her current medications include amlodipine , atorvastatin , hydrochlorothiazide , and aspirin . She has regular nephrology follow-ups every six months, with the last visit in February and the next scheduled for August.  She recalls a previous issue with fluid in her knee, which was drained by an orthopedic specialist at [facility], where she also received a gel injection.  She has a small, round lesion on her right thumb that bleeds spontaneously, first noticed on Saturday while in the car. She has been applying Neosporin to the area and does not recall any injury or burn to the  thumb.  No pain associated with the knot on her chest and no recent injuries or burns to her right thumb.  Additionally, since it has been about a year since her last visit, will also address DM/HTN today as well.    Hypertension This is a chronic problem. The current episode started more than 1 year ago. The problem has been gradually improving since onset. The problem is controlled. Risk factors for coronary artery disease include post-menopausal state. The current treatment provides moderate improvement. Hypertensive end-organ damage includes kidney disease and CVA.  Diabetes She presents for her follow-up diabetic visit. She has type 2 diabetes mellitus. Her disease course has been stable. There are no hypoglycemic associated symptoms. There are no diabetic associated symptoms. There are no hypoglycemic complications. Diabetic complications include a CVA. Risk factors for coronary artery disease include diabetes mellitus, dyslipidemia and post-menopausal. She is following a diabetic diet. An ACE inhibitor/angiotensin II receptor blocker is being taken. Eye exam is current.     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 Swallow whole., Disp: 90 tablet, Rfl: 2   Blood Glucose Monitoring Suppl (TRUE METRIX METER) w/Device KIT, USE AS DIRECTED TO CHECK BLOOD SUGAR TWICE DAILY DX: E11.65, Disp: 1 kit, Rfl: 1   Cholecalciferol (VITAMIN D3) 50 MCG (2000 UT) capsule, TAKE 1 CAPSULE BY MOUTH TWICE DAILY **DISCONTINUE  VITAMIN  D2  50,000  IU**, Disp:  60 capsule, Rfl: 0   Menthol, Topical Analgesic, (ICY HOT EX), Apply 1 Application topically daily as needed (knee pain)., Disp: , Rfl:    Multiple Vitamins-Minerals (ADULT ONE DAILY GUMMIES) CHEW, Chew 1 tablet by mouth every morning., Disp: , Rfl:    olmesartan  (BENICAR ) 40 MG tablet, Take 1 tablet by mouth once daily, Disp: 90 tablet, Rfl: 2   OVER THE COUNTER MEDICATION, Take 1 tablet by mouth every morning. Laminine - amino acid supplement, Disp: , Rfl:    RELION TRUE METRIX TEST STRIPS test strip, USE AS DIRECTED TWICE DAILY, Disp: 50 each, Rfl: 0   amLODipine  (NORVASC ) 10 MG tablet, Take 1 tablet (10 mg total) by mouth every evening., Disp: 90 tablet, Rfl: 2   atorvastatin  (LIPITOR) 80 MG tablet, Take 1 tablet (80 mg total) by mouth daily., Disp: 90 tablet, Rfl: 0   hydrochlorothiazide  (MICROZIDE ) 12.5 MG capsule, Take 1 capsule (12.5 mg total) by mouth daily., Disp: 90 capsule, Rfl: 2   No Known Allergies   Review of Systems  Constitutional: Negative.   Respiratory: Negative.    Cardiovascular: Negative.   Gastrointestinal: Negative.   Neurological: Negative.   Psychiatric/Behavioral: Negative.       Today's Vitals   12/28/23 0834  BP: 122/84  Pulse: 79  Temp: 98.7 F (37.1 C)  SpO2: 98%  Weight: 135 lb 12.8 oz (61.6 kg)  Height: 5' 4 (1.626 m)   Body mass index is 23.31 kg/m.  Wt Readings from Last 3 Encounters:  12/28/23 135 lb 12.8 oz (61.6 kg)  09/16/23 139 lb 6.4 oz (63.2 kg)  03/24/23 135 lb 12.8 oz (61.6 kg)     Objective:  Physical Exam Vitals and nursing note reviewed.  Constitutional:      Appearance: Normal appearance.  HENT:     Head: Normocephalic and atraumatic.  Eyes:     Extraocular Movements: Extraocular movements intact.  Cardiovascular:     Rate and Rhythm: Normal rate and regular rhythm.     Heart sounds: Normal heart sounds.  Pulmonary:     Effort: Pulmonary effort is normal.     Breath sounds: Normal breath sounds.  Chest:      Comments: Left mastectomy Bony abnormality left chest No overlying erythema Musculoskeletal:     Cervical back: Normal range of motion.  Skin:    General: Skin is warm.     Comments: Wound right thumb, well circumscribed Looks like puncture wound  Neurological:     General: No focal deficit present.     Mental Status: She is alert.  Psychiatric:        Mood and Affect: Mood normal.        Behavior: Behavior normal.         Assessment And Plan:  Rib deformity Assessment & Plan: Non-painful bony structure near ribs, likely bone spur or rib prominence. Chest x-ray needed for evaluation. - Order chest x-ray with rib view on both sides.  Orders: -     DG Ribs Bilateral W/Chest; Future  Hypertensive heart and renal disease with  renal failure, stage 1 through stage 4 or unspecified chronic kidney disease, without heart failure Assessment & Plan: Chronic, well controlled. She will continue with olmesartan , amlodipine  and hydrochlorothiazide . Encouraged to continue to follow a low sodium diet.   Orders: -     CBC -     CMP14+EGFR -     Lipid panel -     amLODIPine  Besylate; Take 1 tablet (10 mg total) by mouth every evening.  Dispense: 90 tablet; Refill: 2  Aortic atherosclerosis (HCC) Assessment & Plan: Chronic, LDL goal < 70.  She will c/w atorvastatin  80mg  daily. Importance of medication/dietary compliance was discussed with the patient.    Type 2 diabetes mellitus with stage 3b chronic kidney disease, without long-term current use of insulin  [facility]) Assessment & Plan: Chronic, she has successfully controlled her DM with lifestyle changes. - check labs today - RTO in six months for re-evaluation - Chronic, she is encouraged to stay well hydrated, avoid NSAIDs and keep BP controlled to prevent progression of CKD.     Orders: -     CBC -     CMP14+EGFR -     Lipid panel -     Hemoglobin A1c -     TSH  Pure hypercholesterolemia Assessment & Plan: Chronic, due to  underlying DM and aortic atherosclerosis, LDL goal <70.  She will c/w atorvastatin  80mg  daily.   Orders: -     Lipid panel -     Atorvastatin  Calcium ; Take 1 tablet (80 mg total) by mouth daily.  Dispense: 90 tablet; Refill: 0 -     TSH  Open wound of right thumb, initial encounter Assessment & Plan: Spontaneously bleeding lesion resembling puncture wound or blister. Neosporin applied. - Continue Neosporin during the day, keep lesion open at night.   Body mass index (BMI) of 23.0-23.9 in adult Assessment & Plan: She is within her normal weight range. Encouraged to supplement her meals with snacks.    Other orders -     hydroCHLOROthiazide ; Take 1 capsule (12.5 mg total) by mouth daily.  Dispense: 90 capsule; Refill: 2  Follow-up Scheduled for physical in six months. Blood work and chest x-ray planned for today. - Schedule physical in six months. - Send prescriptions to [facility] on [address]. - Send chest x-ray order to [facility] imaging on [address] and [address]. - Perform blood work today.  Return in 6 months (on 06/29/2024) for physical exam.  Patient was given opportunity to ask questions. Patient verbalized understanding of the plan and was able to repeat key elements of the plan. All questions were answered to their satisfaction.   I, [name], MD, have reviewed all documentation for this visit. The documentation on 12/28/23 for the exam, diagnosis, procedures, and orders are all accurate and complete.   IF YOU HAVE BEEN REFERRED TO A SPECIALIST, IT MAY TAKE 1-2 WEEKS TO SCHEDULE/PROCESS THE REFERRAL. IF YOU HAVE NOT HEARD FROM US /SPECIALIST IN TWO WEEKS, PLEASE GIVE US  A CALL AT [phone].   THE PATIENT IS ENCOURAGED TO PRACTICE SOCIAL DISTANCING DUE TO THE COVID-19 PANDEMIC.

## 2023-12-29 ENCOUNTER — Ambulatory Visit: Payer: Self-pay | Admitting: Internal Medicine

## 2024-01-03 DIAGNOSIS — S61001A Unspecified open wound of right thumb without damage to nail, initial encounter: Secondary | ICD-10-CM | POA: Insufficient documentation

## 2024-01-03 NOTE — Assessment & Plan Note (Signed)
 Spontaneously bleeding lesion resembling puncture wound or blister. Neosporin applied. - Continue Neosporin during the day, keep lesion open at night.

## 2024-01-03 NOTE — Assessment & Plan Note (Signed)
Chronic, due to underlying DM and aortic atherosclerosis, LDL goal <70.  She will c/w atorvastatin 80mg  daily.

## 2024-01-03 NOTE — Patient Instructions (Signed)

## 2024-01-03 NOTE — Assessment & Plan Note (Signed)
 Non-painful bony structure near ribs, likely bone spur or rib prominence. Chest x-ray needed for evaluation. - Order chest x-ray with rib view on both sides.

## 2024-01-08 DIAGNOSIS — M25562 Pain in left knee: Secondary | ICD-10-CM | POA: Diagnosis not present

## 2024-01-11 ENCOUNTER — Emergency Department (HOSPITAL_BASED_OUTPATIENT_CLINIC_OR_DEPARTMENT_OTHER)
Admission: EM | Admit: 2024-01-11 | Discharge: 2024-01-11 | Disposition: A | Discharge status: Home / Self Care | Attending: Emergency Medicine | Admitting: Emergency Medicine

## 2024-01-11 ENCOUNTER — Encounter (HOSPITAL_BASED_OUTPATIENT_CLINIC_OR_DEPARTMENT_OTHER): Payer: Self-pay

## 2024-01-11 ENCOUNTER — Other Ambulatory Visit: Payer: Self-pay

## 2024-01-11 DIAGNOSIS — S61001A Unspecified open wound of right thumb without damage to nail, initial encounter: Secondary | ICD-10-CM | POA: Diagnosis not present

## 2024-01-11 DIAGNOSIS — W458XXA Other foreign body or object entering through skin, initial encounter: Secondary | ICD-10-CM | POA: Insufficient documentation

## 2024-01-11 DIAGNOSIS — Z7982 Long term (current) use of aspirin: Secondary | ICD-10-CM | POA: Diagnosis not present

## 2024-01-11 DIAGNOSIS — S61209A Unspecified open wound of unspecified finger without damage to nail, initial encounter: Secondary | ICD-10-CM

## 2024-01-11 NOTE — ED Triage Notes (Addendum)
 Pt has seen her PCP for this on 7/14 Neosporin and dressing and changing everyday Dtg states there was some type of core to it and has come out.  Now area is bleeding, dtg has a pressure dsg on it.   Bleeding started 2 hrs ago Takes 81mg  ASA 3x weekly

## 2024-01-11 NOTE — ED Provider Notes (Signed)
 [place] EMERGENCY DEPARTMENT AT [facility] Provider Note   CSN: [id number] Arrival date & time: 01/11/24  9985     Patient presents with: wound on finger   Shannon Mcconnell is a 89 y.o. female.   Patient had a random ulcer so up to her right thumb a while back.  She saw her PCP who recommended some antibiotic ointment, rest, elevation however some little piece of something came out of it tonight and started bleeding and they could not get the bleeding to stop so presents here.  Minimally painful.  No history of the same.  Tried some pressure and tried Band-Aids without help.        Prior to Admission medications   Medication Sig Start Date End Date Taking? Authorizing Provider  amLODipine  (NORVASC ) 10 MG tablet Take 1 tablet (10 mg total) by mouth every evening. 12/28/23   Jarold Medici, MD  aspirin  EC [facility] ASPIRIN  ADULT LOW DOSE) 81 MG tablet Take 1 tablet (81 mg total) by mouth daily. Swallow whole. 08/04/23   Jarold Medici, MD  atorvastatin  (LIPITOR) 80 MG tablet Take 1 tablet (80 mg total) by mouth daily. 12/28/23   Jarold Medici, MD  Blood Glucose Monitoring Suppl (TRUE METRIX METER) w/Device KIT USE AS DIRECTED TO CHECK BLOOD SUGAR TWICE DAILY DX: E11.65 05/19/19   Jarold Medici, MD  Cholecalciferol (VITAMIN D3) 50 MCG (2000 UT) capsule TAKE 1 CAPSULE BY MOUTH TWICE DAILY **DISCONTINUE  VITAMIN  D2  50,000  IU** 06/21/18   Jarold Medici, MD  hydrochlorothiazide  (MICROZIDE ) 12.5 MG capsule Take 1 capsule (12.5 mg total) by mouth daily. 12/28/23   Jarold Medici, MD  Menthol, Topical Analgesic, (ICY HOT EX) Apply 1 Application topically daily as needed (knee pain).    [provider]  Multiple Vitamins-Minerals (ADULT ONE DAILY GUMMIES) CHEW Chew 1 tablet by mouth every morning.    [provider]  olmesartan  (BENICAR ) 40 MG tablet Take 1 tablet by mouth once daily 12/25/23   Jarold Medici, MD  OVER THE COUNTER MEDICATION Take 1 tablet by mouth every  morning. Laminine - amino acid supplement    [provider]  RELION TRUE METRIX TEST STRIPS test strip USE AS DIRECTED TWICE DAILY 12/02/23   Jarold Medici, MD    Allergies: Patient has no known allergies.    Review of Systems  Updated Vital Signs BP 124/87 (BP Location: Right Arm)   Pulse 88   Temp 98 F (36.7 C) (Oral)   Resp 16   Ht 5' 4 (1.626 m)   Wt 61 kg   SpO2 100%   BMI 23.08 kg/m   Physical Exam Vitals and nursing note reviewed.  Constitutional:      Appearance: She is well-developed.  HENT:     Head: Normocephalic and atraumatic.  Cardiovascular:     Rate and Rhythm: Normal rate and regular rhythm.  Pulmonary:     Effort: No respiratory distress.     Breath sounds: No stridor.  Abdominal:     General: There is no distension.  Musculoskeletal:     Cervical back: Normal range of motion.  Skin:    General: Skin is warm and dry.     Comments: 4 to 5 mm ulcer with minimal oozing on her right volar surface of her thumb.  Neurological:     Mental Status: She is alert.     (all labs ordered are listed, but only abnormal results are displayed) Labs Reviewed - No data to display  EKG: None  Radiology: No results found.   Procedures   Medications Ordered in the ED - No data to display                                  Medical Decision Making  Able to achieve hemostasis with wound seal and pressure.  Subsequently dressed with a Band-Aid and Coban applied lightly.  Discussed signs and symptoms of swelling causing the Coban to cause strangulation and to take it off.  Otherwise leave it on for 1-2 days and then redress it.  Suggested PCP/dermatology follow-up.  Possibly pyogenic granuloma? Hemostatic at thime of d/c.       Final diagnoses:  Open wound of finger, initial encounter    ED Discharge Orders     None          Eudora Guevarra, Selinda, MD 01/11/24 [phone]

## 2024-01-11 NOTE — Discharge Instructions (Signed)
 Id suggest leaving the dressing on for a couple days and when you take it off be very careful to not disturb the scab. I put it on relatively loose to hold the bandaid in place and to offer a bit more protection but if you start having severe pain or discoloration in your finger above the bandage, take it off and rewrap it loosely like I showed you. Follow you with your PCP and a dermatologist for definitive diagnosis and treatment.

## 2024-01-18 ENCOUNTER — Encounter: Payer: Self-pay | Admitting: Internal Medicine

## 2024-01-18 ENCOUNTER — Ambulatory Visit (INDEPENDENT_AMBULATORY_CARE_PROVIDER_SITE_OTHER): Payer: Self-pay | Admitting: Internal Medicine

## 2024-01-18 ENCOUNTER — Other Ambulatory Visit: Payer: Self-pay | Admitting: Internal Medicine

## 2024-01-18 VITALS — BP 112/80 | HR 82 | Temp 98.7°F | Ht 64.0 in | Wt 134.0 lb

## 2024-01-18 DIAGNOSIS — R630 Anorexia: Secondary | ICD-10-CM | POA: Diagnosis not present

## 2024-01-18 DIAGNOSIS — S61001D Unspecified open wound of right thumb without damage to nail, subsequent encounter: Secondary | ICD-10-CM | POA: Diagnosis not present

## 2024-01-18 DIAGNOSIS — E1122 Type 2 diabetes mellitus with diabetic chronic kidney disease: Secondary | ICD-10-CM

## 2024-01-18 DIAGNOSIS — R9389 Abnormal findings on diagnostic imaging of other specified body structures: Secondary | ICD-10-CM | POA: Diagnosis not present

## 2024-01-18 MED ORDER — MIRTAZAPINE 7.5 MG PO TABS: 7.5000 mg | ORAL_TABLET | Freq: Every day | ORAL | 2 refills | Status: DC

## 2024-01-18 NOTE — Progress Notes (Signed)
 I,Jameka J Llittleton, CMA,acting as a Neurosurgeon for [name], MD.,have documented all relevant documentation on the behalf of [name], MD,as directed by  [name], MD while in the presence of [name], MD.  Subjective:  Patient ID: Shannon Mcconnell , female    DOB: June 13, 1932 , 89 y.o.   MRN: [id number]  Chief Complaint  Patient presents with   HOSPITAL F/U    Patient presents today for a f/u on her thumb. She reports her thumb us  purple on the top. She reported the little cord you put on it came out when she changed her dressing and it caused her thumb to start  bleeding and wouldn't stop. Patient reports pus came out of her finger yesterday.    HPI Discussed the use of AI scribe software for clinical note transcription with the patient, who gave verbal consent to proceed.  History of Present Illness Shannon Mcconnell is a 89 year old female who presents with a bleeding finger wound. She is accompanied by her daughter.  The bleeding finger wound began after removing a Band-Aid and bled for approximately two hours, leading to an emergency room visit on July 28th. The wound has a protruding core, and she is unsure of the initial cause of the bleeding.  The wound is described as having a purple appearance at the top, with brown pus noted by her daughter. The pus was not white, and she speculates it might be related to a substance applied to stop the bleeding. The wound is tender, and she experiences tightness when the bandage is applied, although it is not currently too tight.  She has not experienced any trauma to the finger and does not recall how the issue started. No known allergies to iodine or other substances. She has been using Tylenol  for pain management.  Her daughter has been assisting with wound care. No itching or other symptoms aside from the tightness of the bandage.   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 Swallow whole., Disp: 90 tablet, Rfl: 2   atorvastatin  (LIPITOR) 80 MG tablet, Take 1 tablet (80 mg total) by mouth daily., Disp: 90 tablet, Rfl: 0   Blood Glucose Monitoring Suppl (TRUE METRIX METER) w/Device KIT, USE AS DIRECTED TO CHECK BLOOD SUGAR TWICE DAILY DX: E11.65, Disp: 1 kit, Rfl: 1   Cholecalciferol (VITAMIN D3) 50 MCG (2000 UT) capsule, TAKE 1 CAPSULE BY MOUTH TWICE DAILY **DISCONTINUE  VITAMIN  D2  50,000  IU**, Disp: 60 capsule, Rfl: 0   hydrochlorothiazide  (MICROZIDE ) 12.5 MG capsule, Take 1 capsule (12.5 mg total) by mouth daily., Disp: 90 capsule, Rfl: 2   Menthol, Topical Analgesic, (ICY HOT EX), Apply 1 Application topically daily as needed (knee pain)., Disp: , Rfl:    mirtazapine  (REMERON ) 7.5 MG tablet, Take 1 tablet (7.5 mg total) by  mouth at bedtime., Disp: 30 tablet, Rfl: 2   Multiple Vitamins-Minerals (ADULT ONE DAILY GUMMIES) CHEW, Chew 1 tablet by mouth every morning., Disp: , Rfl:    olmesartan  (BENICAR ) 40 MG tablet, Take 1 tablet by mouth once daily, Disp: 90 tablet, Rfl: 2   OVER THE COUNTER MEDICATION, Take 1 tablet by mouth every morning. Laminine - amino acid supplement, Disp: , Rfl:    RELION TRUE METRIX TEST STRIPS test strip, USE AS DIRECTED TWICE DAILY, Disp: 50 each, Rfl: 0   No Known Allergies   Review of Systems   Today's Vitals   01/18/24 0843  BP: 112/80  Pulse: 82  Temp: 98.7 F (37.1 C)  TempSrc: Oral  Weight: 134 lb  (60.8 kg)  Height: 5' 4 (1.626 m)  PainSc: 0-No pain   Body mass index is 23 kg/m.  Wt Readings from Last 3 Encounters:  01/18/24 134 lb (60.8 kg)  01/11/24 134 lb 7.7 oz (61 kg)  12/28/23 135 lb 12.8 oz (61.6 kg)    The ASCVD Risk score (Arnett DK, et al., 2019) failed to calculate for the following reasons:   The 2019 ASCVD risk score is only valid for ages 20 to 85   Risk score cannot be calculated because patient has a medical history suggesting prior/existing ASCVD  Objective:  Physical Exam Vitals and nursing note reviewed.  Constitutional:      Appearance: Normal appearance.  HENT:     Head: Normocephalic and atraumatic.  Eyes:     Extraocular Movements: Extraocular movements intact.  Cardiovascular:     Rate and Rhythm: Normal rate and regular rhythm.     Heart sounds: Normal heart sounds.  Pulmonary:     Effort: Pulmonary effort is normal.     Breath sounds: Normal breath sounds.  Skin:    General: Skin is warm.     Comments: Growth at tip of right thumb. No surrounding erythema Wound seal covering Tenderness beneath the area  Neurological:     General: No focal deficit present.     Mental Status: She is alert.  Psychiatric:        Mood and Affect: Mood normal.        Behavior: Behavior normal.       Assessment And Plan:  Open wound of right thumb, subsequent encounter Assessment & Plan: Lesion with recurrent bleeding and dark discharge. Likely benign but requires dermatological evaluation.  Possibly pyogenic granuloma. - Refer to dermatology for evaluation and management. - [employer], ensuring circulation. - Scheduled dermatology appointment for Wednesday.  Orders: -     Ambulatory referral to Dermatology  Poor appetite Assessment & Plan: She is agreeable to starting mirtazapine  7.5mg  nightly. She agrees to rto in 8 weeks for re-evaluation.    Abnormal finding on CT scan -     CT CHEST WO CONTRAST; Future  Other orders -      Mirtazapine ; Take 1 tablet (7.5 mg total) by mouth at bedtime.  Dispense: 30 tablet; Refill: 2   Return in 1 week (on 01/25/2024).  Patient was given opportunity to ask questions. Patient verbalized understanding of the plan and was able to repeat key elements of the plan. All questions were answered to their satisfaction.    I, [name], MD, have reviewed all documentation for this visit. The documentation on 01/18/24 for the exam, diagnosis, procedures, and orders are all accurate and complete.   IF YOU HAVE BEEN REFERRED TO A SPECIALIST, IT  MAY TAKE 1-2 WEEKS TO SCHEDULE/PROCESS THE REFERRAL. IF YOU HAVE NOT HEARD FROM US /SPECIALIST IN TWO WEEKS, PLEASE GIVE US  A CALL AT [phone].

## 2024-01-18 NOTE — Assessment & Plan Note (Addendum)
 Lesion with recurrent bleeding and dark discharge. Likely benign but requires dermatological evaluation.  Possibly pyogenic granuloma. - Refer to dermatology for evaluation and management. - [employer], ensuring circulation. - Scheduled dermatology appointment for Wednesday.

## 2024-01-18 NOTE — Assessment & Plan Note (Signed)
 She is agreeable to starting mirtazapine  7.5mg  nightly. She agrees to rto in 8 weeks for re-evaluation.

## 2024-01-18 NOTE — Patient Instructions (Signed)
Pyogenic Granuloma Pyogenic granuloma is a growth (lesion) that forms on the skin or on the mucous membranes of the mouth. This type of lesion is a lump of very red tissue that bleeds easily, is usually a single lesion, and most often affects: The head and neck. The mucous membranes of the mouth or tongue. The upper body. The hands and feet. A pyogenic granuloma usually measures about 0.2 inches (0.5 cm), but lesions can be smaller or larger. This condition does not spread from person to person (is not contagious). The lesion is noncancerous (benign). What are the causes? The cause of pyogenic granulomas is unknown, but the lesions commonly occur after a minor injury, such as pricking your skin or biting your lip or tongue. A mound of tiny blood vessels (capillaries) forms to create a lesion. Sometimes the lesions occur without an injury. What increases the risk? You are more likely to develop this condition if: You are pregnant. You are a child or young adult. You take certain medicines, including: Medicines for acne. Birth control pills. Some medicines used to treat cancer, HIV, or AIDS. What are the signs or symptoms? The main symptom of this condition is a raised or lumpy lesion that is very red. Your lesion may also: Have a crusty, broken, and irritated (ulcerated) surface. Bleed easily. Be slightly sore. How is this diagnosed? This condition is diagnosed based on your symptoms and medical history, especially if you recently had an injury. You may also have: A physical exam. A small piece of your granuloma removed for testing (biopsy) to rule out cancer. How is this treated? A small lesion may go away without treatment. You may have to stop or change any medicines that caused your lesion. Pyogenic granulomas caused by pregnancy usually go away after delivery. This condition may also be treated by removing the lesion. This may be done if the lesion is large, irritated, or bleeds  easily. Removal may involve: Curettage. This scrapes away the lesion. Using chemicals or electric energy to destroy the lesion. Surgical excision. This removes the lesion along with a small piece of normal skin or mucous membrane. This is the best treatment to prevent the lesion from coming back. Follow these instructions at home: Take over-the-counter and prescription medicines only as told by your health care provider. Do not scratch or pick at your lesion. Cover your lesion area with a bandage (dressing) or gauze to avoid having anything rub against your lesion. Keep your lesion clean to avoid infection. Keep all follow-up visits. This is important. Contact a health care provider if: You have a fever. Your lesion bleeds. Your lesion comes back after treatment. You have a lesion that grows rapidly or hardens. Summary Pyogenic granuloma is a growth (lesion) that forms on the skin or on the mucous membranes of the mouth. This condition does not spread from person to person (is not contagious). A small lesion may go away without treatment. If your lesion is large, irritated, or bleeds easily, you may need to have it removed. Do not scratch or pick at your lesion. Cover your lesion area with a bandage (dressing) or gauze to avoid having anything rub against your lesion. Keep your lesion clean to avoid infection. This information is not intended to replace advice given to you by your health care provider. Make sure you discuss any questions you have with your health care provider. Document Revised: 09/27/2020 Document Reviewed: 09/27/2020 Elsevier Patient Education  2024 [employer].

## 2024-01-20 DIAGNOSIS — L980 Pyogenic granuloma: Secondary | ICD-10-CM | POA: Diagnosis not present

## 2024-01-22 ENCOUNTER — Ambulatory Visit
Admission: RE | Admit: 2024-01-22 | Discharge: 2024-01-22 | Disposition: A | Discharge status: Home / Self Care | Source: Ambulatory Visit | Attending: Internal Medicine | Admitting: Internal Medicine

## 2024-01-22 DIAGNOSIS — R9389 Abnormal findings on diagnostic imaging of other specified body structures: Secondary | ICD-10-CM

## 2024-01-22 DIAGNOSIS — J432 Centrilobular emphysema: Secondary | ICD-10-CM | POA: Diagnosis not present

## 2024-01-26 DIAGNOSIS — E1122 Type 2 diabetes mellitus with diabetic chronic kidney disease: Secondary | ICD-10-CM | POA: Diagnosis not present

## 2024-01-26 DIAGNOSIS — N1832 Chronic kidney disease, stage 3b: Secondary | ICD-10-CM | POA: Diagnosis not present

## 2024-01-26 DIAGNOSIS — L538 Other specified erythematous conditions: Secondary | ICD-10-CM | POA: Diagnosis not present

## 2024-01-26 DIAGNOSIS — R58 Hemorrhage, not elsewhere classified: Secondary | ICD-10-CM | POA: Diagnosis not present

## 2024-01-26 DIAGNOSIS — N281 Cyst of kidney, acquired: Secondary | ICD-10-CM | POA: Diagnosis not present

## 2024-01-26 DIAGNOSIS — L980 Pyogenic granuloma: Secondary | ICD-10-CM | POA: Diagnosis not present

## 2024-01-26 DIAGNOSIS — L910 Hypertrophic scar: Secondary | ICD-10-CM | POA: Diagnosis not present

## 2024-01-26 DIAGNOSIS — I129 Hypertensive chronic kidney disease with stage 1 through stage 4 chronic kidney disease, or unspecified chronic kidney disease: Secondary | ICD-10-CM | POA: Diagnosis not present

## 2024-01-26 DIAGNOSIS — R208 Other disturbances of skin sensation: Secondary | ICD-10-CM | POA: Diagnosis not present

## 2024-02-14 ENCOUNTER — Ambulatory Visit: Payer: Self-pay | Admitting: Internal Medicine

## 2024-02-17 ENCOUNTER — Other Ambulatory Visit: Payer: Self-pay | Admitting: Internal Medicine

## 2024-02-17 DIAGNOSIS — E041 Nontoxic single thyroid nodule: Secondary | ICD-10-CM

## 2024-02-19 ENCOUNTER — Ambulatory Visit
Admission: RE | Admit: 2024-02-19 | Discharge: 2024-02-19 | Disposition: A | Discharge status: Home / Self Care | Source: Ambulatory Visit | Attending: Internal Medicine | Admitting: Internal Medicine

## 2024-02-19 DIAGNOSIS — E041 Nontoxic single thyroid nodule: Secondary | ICD-10-CM | POA: Diagnosis not present

## 2024-02-25 ENCOUNTER — Ambulatory Visit: Payer: Self-pay | Admitting: Internal Medicine

## 2024-02-28 ENCOUNTER — Other Ambulatory Visit: Payer: Self-pay | Admitting: Internal Medicine

## 2024-02-28 DIAGNOSIS — E041 Nontoxic single thyroid nodule: Secondary | ICD-10-CM

## 2024-03-16 ENCOUNTER — Other Ambulatory Visit (HOSPITAL_COMMUNITY)
Admission: RE | Admit: 2024-03-16 | Discharge: 2024-03-16 | Disposition: A | Discharge status: Home / Self Care | Source: Ambulatory Visit | Attending: Diagnostic Radiology | Admitting: Diagnostic Radiology

## 2024-03-16 ENCOUNTER — Ambulatory Visit
Admission: RE | Admit: 2024-03-16 | Discharge: 2024-03-16 | Disposition: A | Discharge status: Home / Self Care | Source: Ambulatory Visit | Attending: Internal Medicine | Admitting: Internal Medicine

## 2024-03-16 DIAGNOSIS — E041 Nontoxic single thyroid nodule: Secondary | ICD-10-CM

## 2024-03-18 LAB — CYTOLOGY - NON PAP

## 2024-03-19 ENCOUNTER — Ambulatory Visit: Payer: Self-pay | Admitting: Internal Medicine

## 2024-03-29 ENCOUNTER — Encounter: Payer: Self-pay | Admitting: Internal Medicine

## 2024-03-29 ENCOUNTER — Ambulatory Visit: Admitting: Internal Medicine

## 2024-03-29 VITALS — BP 110/82 | HR 97 | Temp 98.8°F | Ht 64.0 in | Wt 139.4 lb

## 2024-03-29 DIAGNOSIS — I700 Atherosclerosis of aorta: Secondary | ICD-10-CM

## 2024-03-29 DIAGNOSIS — I1310 Hypertensive heart and chronic kidney disease without heart failure, with stage 1 through stage 4 chronic kidney disease, or unspecified chronic kidney disease: Secondary | ICD-10-CM

## 2024-03-29 DIAGNOSIS — E041 Nontoxic single thyroid nodule: Secondary | ICD-10-CM | POA: Diagnosis not present

## 2024-03-29 DIAGNOSIS — M1712 Unilateral primary osteoarthritis, left knee: Secondary | ICD-10-CM

## 2024-03-29 DIAGNOSIS — E1122 Type 2 diabetes mellitus with diabetic chronic kidney disease: Secondary | ICD-10-CM | POA: Diagnosis not present

## 2024-03-29 DIAGNOSIS — Z23 Encounter for immunization: Secondary | ICD-10-CM | POA: Diagnosis not present

## 2024-03-29 DIAGNOSIS — N1831 Chronic kidney disease, stage 3a: Secondary | ICD-10-CM

## 2024-03-29 DIAGNOSIS — R911 Solitary pulmonary nodule: Secondary | ICD-10-CM

## 2024-03-29 DIAGNOSIS — E7800 Pure hypercholesterolemia, unspecified: Secondary | ICD-10-CM | POA: Diagnosis not present

## 2024-03-29 NOTE — Progress Notes (Signed)
 I,Shannon Mcconnell, CMA,acting as a Neurosurgeon for Shannon LOISE Slocumb, MD.,have documented all relevant documentation on the behalf of Shannon LOISE Slocumb, MD,as directed by  Shannon LOISE Slocumb, MD while in the presence of Shannon LOISE Slocumb, MD.  Subjective:  Patient ID: Shannon Mcconnell , female    DOB: 1931/12/22 , 89 y.o.   MRN: [id number]  Chief Complaint  Patient presents with   Diabetes    Patient presents today for BP & DM Check, she reports compliance with medications. She is accompanied by her daughter today.  She denies headache, dizziness, chest pain, and SOB. She would like to go over US  completed on 10/01.    Hypertension   Hyperlipidemia    HPI Discussed the use of AI scribe software for clinical note transcription with the patient, who gave verbal consent to proceed.  History of Present Illness Shannon Mcconnell is a 89 year old female with diabetes and hypertension who presents for a diabetes and blood pressure check.  She is accompanied by her daughter.   She recently underwent a thyroid  ultrasound and biopsy. The ultrasound revealed a nodule in the right mid thyroid , and a fine needle aspiration was recommended. The biopsy results were benign. A second nodule in the lower right thyroid  requires yearly ultrasound monitoring.  A CT of the chest last month showed an irregular lung nodule. A follow-up CT is planned to monitor this finding. Additionally, her aorta is slightly enlarged. She should seek emergency care if she experiences chest pain radiating to the back.  She has a history of emphysema, likely related to past smoking, and experiences thinning of the bones and arthritis. She recently gained five pounds. She uses Biofreeze daily for her arthritis and has had a cortisone shot for her left knee pain and effusion, which lasted about five months. She uses Voltaren gel for inflammation and is considering dietary changes to manage symptoms.  She has a history of kidney issues, with a  cyst noted in a 2022 CT scan. She is on aspirin , cholesterol medication, olmesartan , and hydrochlorothiazide  to manage her conditions. She should seek emergency care if she experiences severe gastrointestinal symptoms to prevent dehydration.  Her last nephrology visit was in August, and she is on a yearly follow-up schedule.   Hypertension This is a chronic problem. The current episode started more than 1 year ago. The problem has been gradually improving since onset. The problem is controlled. Risk factors for coronary artery disease include post-menopausal state. The current treatment provides moderate improvement. Hypertensive end-organ damage includes kidney disease and CVA.  Diabetes She presents for her follow-up diabetic visit. She has type 2 diabetes mellitus. Her disease course has been stable. There are no hypoglycemic associated symptoms. There are no diabetic associated symptoms. There are no hypoglycemic complications. Diabetic complications include a CVA. Risk factors for coronary artery disease include diabetes mellitus, dyslipidemia and post-menopausal. She is following a diabetic diet. An ACE inhibitor/angiotensin II receptor blocker is being taken. Eye exam is current.     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 Swallow whole., Disp: 90 tablet, Rfl: 2   atorvastatin  (LIPITOR) 80 MG tablet, Take 1 tablet (80 mg  total) by mouth daily., Disp: 90 tablet, Rfl: 0   Blood Glucose Monitoring Suppl (TRUE METRIX METER) w/Device KIT, USE AS DIRECTED TO CHECK BLOOD SUGAR TWICE DAILY DX: E11.65, Disp: 1 kit, Rfl: 1   Cholecalciferol (VITAMIN D3) 50 MCG (2000 UT) capsule, TAKE 1 CAPSULE BY MOUTH TWICE DAILY **DISCONTINUE  VITAMIN  D2  50,000  IU**, Disp: 60 capsule, Rfl: 0   glucose blood (RELION TRUE METRIX TEST STRIPS) test strip, USE AS DIRECTED TWICE  A  DAY, Disp: 50 each, Rfl: 2   hydrochlorothiazide  (MICROZIDE ) 12.5 MG capsule, Take 1 capsule (12.5 mg total) by mouth daily., Disp: 90 capsule, Rfl: 2   Menthol, Topical Analgesic, (ICY HOT EX), Apply 1 Application topically daily as needed (knee pain)., Disp: , Rfl:    mirtazapine  (REMERON ) 7.5 MG tablet, Take 1 tablet (7.5 mg total) by mouth at bedtime., Disp: 30 tablet, Rfl: 2   Multiple Vitamins-Minerals (ADULT ONE DAILY GUMMIES) CHEW, Chew 1 tablet by mouth every morning., Disp: , Rfl:    olmesartan  (BENICAR ) 40 MG tablet, Take 1 tablet by mouth once daily, Disp: 90 tablet, Rfl: 2   OVER THE COUNTER MEDICATION, Take 1 tablet by mouth every morning. Laminine - amino acid supplement, Disp: , Rfl:    No Known Allergies   Review of Systems  Constitutional: Negative.   Respiratory: Negative.    Cardiovascular: Negative.   Gastrointestinal: Negative.   Musculoskeletal:  Positive for arthralgias.  Neurological: Negative.   Psychiatric/Behavioral: Negative.       Today's Vitals   03/29/24 1415  BP: 110/82  Pulse: 97  Temp: 98.8 F (37.1 C)  SpO2: 98%  Weight: 139 lb 6.4 oz (63.2 kg)  Height: 5' 4 (1.626 m)   Body mass index is 23.93 kg/m.  Wt Readings from Last 3 Encounters:  03/29/24 139 lb 6.4 oz (63.2 kg)  01/18/24 134 lb (60.8 kg)  01/11/24 134 lb 7.7 oz (61 kg)     Objective:  Physical Exam Vitals and nursing note reviewed.  Constitutional:      Appearance: Normal appearance.  HENT:     Head: Normocephalic and atraumatic.  Eyes:      Extraocular Movements: Extraocular movements intact.  Cardiovascular:     Rate and Rhythm: Normal rate and regular rhythm.     Heart sounds: Normal heart sounds.  Pulmonary:     Effort: Pulmonary effort is normal.     Breath sounds: Normal breath sounds.  Musculoskeletal:        General: Swelling present.     Cervical back: Normal range of motion.  Skin:    General: Skin is warm.  Neurological:     General: No focal deficit present.     Mental Status: She is alert.  Psychiatric:        Mood and Affect: Mood normal.        Behavior: Behavior normal.      Assessment And Plan:  Type 2 diabetes mellitus with stage 3a chronic kidney disease, without long-term current use of insulin  (HCC) Assessment & Plan: Chronic, managed without medication.  Diabetes management ongoing with positive weight gain. Low risk for end-stage renal disease.  Emphasized hydration importance. - Ensure adequate hydration. - Seek emergency care if experiencing significant nausea, vomiting, or diarrhea.  Orders: -     Hemoglobin A1c -     BMP8+EGFR  Hypertensive heart and renal disease with renal failure, stage 1 through stage 4 or unspecified chronic kidney disease, without heart failure Assessment & Plan: Chronic, fair control. Blood pressure management ongoing. Slight aortic enlargement noted, no surgical intervention needed. Regular CT monitoring planned. - Continue current antihypertensive regimen including olmesartan  and hydrochlorothiazide . - Seek emergency care if experiencing chest pain radiating to the back.   Aortic atherosclerosis Assessment & Plan: Atherosclerosis noted in the aorta. Managed with aspirin  and cholesterol medication. - Continue aspirin  therapy. - Continue cholesterol-lowering medication, atorvastatin  80mg  daily   Pure hypercholesterolemia Assessment & Plan: Cholesterol management ongoing with current medication regimen. - Continue current cholesterol-lowering  medication, atorvastatin  80mg  daily   Primary osteoarthritis of left knee Assessment & Plan: Severe osteoarthritis with recurrent effusion. Previous steroid injection provided relief. Discussed dietary modifications for anti-inflammatory effects. - Schedule follow-up appointment with orthopedics. - Use Voltaren gel daily on the knee. - Consider dietary modifications including ginger and turmeric for anti-inflammatory effects.   Lung nodule seen on imaging study Assessment & Plan: Pulmonary nodule, right lung, under surveillance Irregular lung nodule identified on CT. Decision for surveillance with recheck in 6-9 months. Agreed with surveillance approach. - Schedule follow-up chest CT in 6-9 months. - Monitor for symptoms such as worsening cough or weight loss.   Thyroid  nodule Assessment & Plan: Recent biopsy of right thyroid  nodule returned benign. Surveillance with annual ultrasound planned. - Schedule follow-up thyroid  ultrasound for September next year.   Immunization due -     Flu vaccine HIGH DOSE PF(Fluzone Trivalent)   Return if symptoms worsen or fail to improve.  Patient was given opportunity to ask questions. Patient verbalized understanding of the plan and was able to repeat key elements of the plan. All questions were answered to their satisfaction.   I, Shannon LOISE Slocumb, MD, have reviewed all documentation for this visit. The documentation on 03/29/24 for the exam, diagnosis, procedures, and orders are all accurate and complete.   IF YOU HAVE BEEN REFERRED TO A SPECIALIST, IT MAY TAKE 1-2 WEEKS TO SCHEDULE/PROCESS THE REFERRAL. IF YOU HAVE NOT HEARD FROM US /SPECIALIST IN TWO WEEKS, PLEASE GIVE US  A CALL AT [phone].   THE PATIENT IS ENCOURAGED TO PRACTICE SOCIAL DISTANCING DUE TO THE COVID-19 PANDEMIC.

## 2024-03-29 NOTE — Patient Instructions (Signed)

## 2024-03-30 ENCOUNTER — Ambulatory Visit: Payer: Self-pay | Admitting: Internal Medicine

## 2024-03-30 LAB — HEMOGLOBIN A1C
Est. average glucose Bld gHb Est-mCnc: 126 mg/dL
Hgb A1c MFr Bld: 6 % — ABNORMAL HIGH (ref 4.8–5.6)

## 2024-03-30 LAB — BMP8+EGFR
BUN/Creatinine Ratio: 24 (ref 12–28)
BUN: 34 mg/dL (ref 10–36)
CO2: 21 mmol/L (ref 20–29)
Calcium: 9.6 mg/dL (ref 8.7–10.3)
Chloride: 96 mmol/L (ref 96–106)
Creatinine, Ser: 1.43 mg/dL — ABNORMAL HIGH (ref 0.57–1.00)
Glucose: 95 mg/dL (ref 70–99)
Potassium: 4.2 mmol/L (ref 3.5–5.2)
Sodium: 137 mmol/L (ref 134–144)
eGFR: 35 mL/min/1.73 — ABNORMAL LOW (ref >59)

## 2024-04-02 DIAGNOSIS — M1711 Unilateral primary osteoarthritis, right knee: Secondary | ICD-10-CM | POA: Diagnosis not present

## 2024-04-03 DIAGNOSIS — E041 Nontoxic single thyroid nodule: Secondary | ICD-10-CM | POA: Insufficient documentation

## 2024-04-03 DIAGNOSIS — M1712 Unilateral primary osteoarthritis, left knee: Secondary | ICD-10-CM | POA: Insufficient documentation

## 2024-04-03 DIAGNOSIS — R911 Solitary pulmonary nodule: Secondary | ICD-10-CM | POA: Insufficient documentation

## 2024-04-03 NOTE — Assessment & Plan Note (Signed)
 Cholesterol management ongoing with current medication regimen. - Continue current cholesterol-lowering medication, atorvastatin  80mg  daily

## 2024-04-03 NOTE — Assessment & Plan Note (Signed)
 Pulmonary nodule, right lung, under surveillance Irregular lung nodule identified on CT. Decision for surveillance with recheck in 6-9 months. Agreed with surveillance approach. - Schedule follow-up chest CT in 6-9 months. - Monitor for symptoms such as worsening cough or weight loss.

## 2024-04-03 NOTE — Assessment & Plan Note (Addendum)
 Chronic, fair control. Blood pressure management ongoing. Slight aortic enlargement noted, no surgical intervention needed. Regular CT monitoring planned. - Continue current antihypertensive regimen including olmesartan  and hydrochlorothiazide . - Seek emergency care if experiencing chest pain radiating to the back.

## 2024-04-03 NOTE — Assessment & Plan Note (Signed)
 Recent biopsy of right thyroid  nodule returned benign. Surveillance with annual ultrasound planned. - Schedule follow-up thyroid  ultrasound for September next year.

## 2024-04-03 NOTE — Assessment & Plan Note (Addendum)
 Atherosclerosis noted in the aorta. Managed with aspirin  and cholesterol medication. - Continue aspirin  therapy. - Continue cholesterol-lowering medication, atorvastatin  80mg  daily

## 2024-04-03 NOTE — Assessment & Plan Note (Signed)
 Severe osteoarthritis with recurrent effusion. Previous steroid injection provided relief. Discussed dietary modifications for anti-inflammatory effects. - Schedule follow-up appointment with orthopedics. - Use Voltaren gel daily on the knee. - Consider dietary modifications including ginger and turmeric for anti-inflammatory effects.

## 2024-04-03 NOTE — Assessment & Plan Note (Signed)
 Chronic, managed without medication.  Diabetes management ongoing with positive weight gain. Low risk for end-stage renal disease. Emphasized hydration importance. - Ensure adequate hydration. - Seek emergency care if experiencing significant nausea, vomiting, or diarrhea.

## 2024-04-14 ENCOUNTER — Ambulatory Visit: Payer: Self-pay

## 2024-04-14 NOTE — Telephone Encounter (Signed)
 First attempt to reach patient for triage, LVM for patient to return call to [phone]. Placed in callbacks for follow up Copied from CRM #[id number]. Topic: Clinical - Medical Advice >> Apr 14, 2024  2:48 PM Darshell M wrote: Reason for CRM: Patient experiencing constipation. Noticed blood in her stool on Tuesday when she wipes. No blood when wiping since and no longer constipated Patient would like advice as to how to proceed.  Patient's daughter, Olam  [phone]

## 2024-04-14 NOTE — Telephone Encounter (Signed)
 FYI Only or Action Required?: Action required by provider: clinical question for provider.  Patient was last seen in primary care on 03/29/2024 by Jarold Medici, MD.  Called Nurse Triage reporting Rectal Bleeding.  Symptoms began several days ago.  Interventions attempted: Rest, hydration, or home remedies.  Symptoms are: completely resolved.  Triage Disposition: Home Care  Patient/caregiver understands and will follow disposition?: Yes   PT wants to know if she needs to have another colonoscopy if so when.     Reason for Disposition  MILD constipation  Answer Assessment - Initial Assessment Questions Rn spoke to patient's daughter. Pt had blood in her stool only on Tuesday. She states pt was constipated and had it after straining. No longer has constipation, no longer has any blood. Dtr just wanted to know when last colonoscopy was and if she needs to have another if so when is she due.     2. STRAINING: Do you have to strain to have a BM?      Did on tuesday  4. RECTAL PAIN: Does your rectum hurt when the stool comes out? If Yes, ask: Do you have hemorrhoids? How bad is the pain?  (Scale 1-10; or mild, moderate, severe)     None currently.  5. BM COMPOSITION: Are the stools hard?      Not anymore 6. BLOOD ON STOOLS: Has there been any blood on the toilet tissue or on the surface of the BM? If Yes, ask: When was the last time?     Yes on Tuesday, none now  Protocols used: Constipation-A-AH

## 2024-04-15 ENCOUNTER — Other Ambulatory Visit: Payer: Self-pay | Admitting: Internal Medicine

## 2024-04-15 DIAGNOSIS — E1122 Type 2 diabetes mellitus with diabetic chronic kidney disease: Secondary | ICD-10-CM

## 2024-04-19 ENCOUNTER — Other Ambulatory Visit: Payer: Self-pay | Admitting: Internal Medicine

## 2024-04-19 DIAGNOSIS — M1711 Unilateral primary osteoarthritis, right knee: Secondary | ICD-10-CM | POA: Diagnosis not present

## 2024-04-19 DIAGNOSIS — M25561 Pain in right knee: Secondary | ICD-10-CM | POA: Diagnosis not present

## 2024-04-19 DIAGNOSIS — M170 Bilateral primary osteoarthritis of knee: Secondary | ICD-10-CM | POA: Diagnosis not present

## 2024-04-28 DIAGNOSIS — M1711 Unilateral primary osteoarthritis, right knee: Secondary | ICD-10-CM | POA: Diagnosis not present

## 2024-05-05 DIAGNOSIS — M25461 Effusion, right knee: Secondary | ICD-10-CM | POA: Diagnosis not present

## 2024-05-05 DIAGNOSIS — M1711 Unilateral primary osteoarthritis, right knee: Secondary | ICD-10-CM | POA: Diagnosis not present

## 2024-05-06 DIAGNOSIS — M1711 Unilateral primary osteoarthritis, right knee: Secondary | ICD-10-CM | POA: Diagnosis not present

## 2024-05-11 DIAGNOSIS — M1711 Unilateral primary osteoarthritis, right knee: Secondary | ICD-10-CM | POA: Diagnosis not present

## 2024-06-03 ENCOUNTER — Other Ambulatory Visit: Payer: Self-pay | Admitting: Internal Medicine

## 2024-06-03 DIAGNOSIS — E7800 Pure hypercholesterolemia, unspecified: Secondary | ICD-10-CM

## 2024-07-07 ENCOUNTER — Encounter: Payer: Self-pay | Admitting: Internal Medicine

## 2024-07-08 ENCOUNTER — Other Ambulatory Visit: Payer: Self-pay | Admitting: Internal Medicine

## 2024-07-13 ENCOUNTER — Encounter: Payer: Self-pay | Admitting: Internal Medicine

## 2024-07-13 ENCOUNTER — Ambulatory Visit: Payer: Self-pay | Admitting: Internal Medicine

## 2024-07-13 VITALS — BP 110/76 | Temp 98.3°F | Ht 64.0 in | Wt 140.4 lb

## 2024-07-13 DIAGNOSIS — R911 Solitary pulmonary nodule: Secondary | ICD-10-CM

## 2024-07-13 DIAGNOSIS — M170 Bilateral primary osteoarthritis of knee: Secondary | ICD-10-CM

## 2024-07-13 DIAGNOSIS — E7800 Pure hypercholesterolemia, unspecified: Secondary | ICD-10-CM

## 2024-07-13 DIAGNOSIS — Z0000 Encounter for general adult medical examination without abnormal findings: Secondary | ICD-10-CM

## 2024-07-13 DIAGNOSIS — I1310 Hypertensive heart and chronic kidney disease without heart failure, with stage 1 through stage 4 chronic kidney disease, or unspecified chronic kidney disease: Secondary | ICD-10-CM

## 2024-07-13 DIAGNOSIS — E1122 Type 2 diabetes mellitus with diabetic chronic kidney disease: Secondary | ICD-10-CM

## 2024-07-13 DIAGNOSIS — I700 Atherosclerosis of aorta: Secondary | ICD-10-CM

## 2024-07-13 LAB — POCT URINALYSIS DIPSTICK
Blood, UA: NEGATIVE
Glucose, UA: NEGATIVE
Nitrite, UA: NEGATIVE
Protein, UA: POSITIVE — AB
Spec Grav, UA: 1.025
Urobilinogen, UA: 0.2 U/dL
pH, UA: 5.5

## 2024-07-13 MED ORDER — MIRTAZAPINE 7.5 MG PO TABS: 7.5000 mg | ORAL_TABLET | Freq: Every day | ORAL | 1 refills | Status: AC

## 2024-07-13 NOTE — Progress Notes (Unsigned)
 I,Shannon Mcconnell, CMA,acting as a neurosurgeon for Shannon LOISE Slocumb, MD.,have documented all relevant documentation on the behalf of Shannon LOISE Slocumb, MD,as directed by  Shannon LOISE Slocumb, MD while in the presence of Shannon LOISE Slocumb, MD.  Subjective:    Patient ID: Shannon Mcconnell , female    DOB: 1931/06/20 , 89 y.o.   MRN: [id number]  Chief Complaint  Patient presents with   Annual Exam   Diabetes   Hypertension   Hyperlipidemia    HPI Discussed the use of AI scribe software for clinical note transcription with the patient, who gave verbal consent to proceed.  History of Present Illness [name] is a 89 year old female with diabetes and hypertension who presents for a routine physical exam and check-up.  She has experienced a weight gain of six pounds over the past six months, which she attributes to an increased intake of rice. She maintains a good appetite and requests a 90-day supply of her appetite medication. She continues to take aspirin , amlodipine , atorvastatin , hydrochlorothiazide , and olmesartan . She reports sleeping well with her current medication regimen.  She has a history of a thyroid  ultrasound and biopsy, which were benign, and a CT scan of the chest that revealed a lung nodule. She prefers to have the nodule rechecked next month.  She experiences constipation, possibly due to increased rice and peanut butter intake, despite using chia seeds in her diet.  Her left knee causes discomfort, though there is some improvement with wrapping and topical treatments like Biofreeze. She notes decreased mobility around the house compared to before.  She drinks a lot of water and reports good sensation in her feet. No itching in her ears. She wakes up at 6 AM out of habit from her working days and feels rested upon waking.   Hypertension This is a chronic problem. The current episode started more than 1 year ago. The problem has been gradually improving since onset. The  problem is controlled. Pertinent negatives include no blurred vision. Past treatments include calcium  channel blockers, angiotensin blockers and diuretics. The current treatment provides moderate improvement. There are no compliance problems.  Hypertensive end-organ damage includes kidney disease.  Diabetes She presents for her follow-up diabetic visit. She has type 2 diabetes mellitus. Pertinent negatives for diabetes include no blurred vision. She has not had a previous visit with a dietitian. She participates in exercise intermittently. An ACE inhibitor/angiotensin II receptor blocker is being taken.     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 Swallow whole., Disp: 90 tablet, Rfl: 2   atorvastatin  (LIPITOR) 80 MG tablet, Take 1 tablet by mouth once daily, Disp: 90 tablet, Rfl: 0   Blood Glucose Monitoring Suppl (TRUE METRIX METER) w/Device KIT, USE AS DIRECTED TO CHECK BLOOD SUGAR TWICE DAILY DX: E11.65, Disp: 1 kit, Rfl: 1   Cholecalciferol (VITAMIN  D3) 50 MCG (2000 UT) capsule, TAKE 1 CAPSULE BY MOUTH TWICE DAILY **DISCONTINUE  VITAMIN  D2  50,000  IU**, Disp: 60 capsule, Rfl: 0   hydrochlorothiazide  (MICROZIDE ) 12.5 MG capsule, Take 1 capsule (12.5 mg total) by mouth daily., Disp: 90 capsule, Rfl: 2   Menthol, Topical Analgesic, (ICY HOT EX), Apply 1 Application topically daily as needed (knee pain)., Disp: , Rfl:     mirtazapine  (REMERON ) 7.5 MG tablet, TAKE 1 TABLET BY MOUTH AT BEDTIME, Disp: 30 tablet, Rfl: 0   Multiple Vitamins-Minerals (ADULT ONE DAILY GUMMIES) CHEW, Chew 1 tablet by mouth every morning., Disp: , Rfl:    olmesartan  (BENICAR ) 40 MG tablet, Take 1 tablet by mouth once daily, Disp: 90 tablet, Rfl: 2   OVER THE COUNTER MEDICATION, Take 1 tablet by mouth every morning. Laminine - amino acid supplement, Disp: , Rfl:    RELION TRUE METRIX TEST STRIPS test strip, USE AS DIRECTED TWICE  A  DAY, Disp: 50 each, Rfl: 0   No Known Allergies    The patient states she uses status post hysterectomy for birth control. No LMP recorded. Patient has had a hysterectomy.. Negative for Dysmenorrhea. Negative for: breast discharge, breast lump(s), breast pain and breast self exam. Associated symptoms include abnormal vaginal bleeding. Pertinent negatives include abnormal bleeding (hematology), anxiety, decreased libido, depression, difficulty falling sleep, dyspareunia, history of infertility, nocturia, sexual dysfunction, sleep disturbances, urinary incontinence, urinary urgency, vaginal discharge and vaginal itching. Diet regular.The patient states her exercise level is    . The patient's tobacco use is: Tobacco Use History[1]. She has been exposed to passive smoke. The patient's alcohol use is:  Social History   Substance and Sexual Activity  Alcohol Use Never    Review of Systems  Constitutional: Negative.   HENT: Negative.    Eyes: Negative.  Negative for blurred vision.  Respiratory: Negative.    Cardiovascular: Negative.   Gastrointestinal: Negative.   Endocrine: Negative.   Genitourinary: Negative.   Musculoskeletal: Negative.   Skin: Negative.   Allergic/Immunologic: Negative.   Neurological: Negative.   Hematological: Negative.   Psychiatric/Behavioral: Negative.       Today's Vitals   07/13/24 1109  BP: 110/76  Temp: 98.3 F (36.8 C)  SpO2: 98%  Weight: 140 lb 6.4 oz  (63.7 kg)  Height: 5' 4 (1.626 m)   Body mass index is 24.1 kg/m.  Wt Readings from Last 3 Encounters:  07/13/24 140 lb 6.4 oz (63.7 kg)  03/29/24 139 lb 6.4 oz (63.2 kg)  01/18/24 134 lb (60.8 kg)     Objective:  Physical Exam      Assessment And Plan:     Encounter for annual health examination  Type 2 diabetes mellitus with stage 3a chronic kidney disease, without long-term current use of insulin  (HCC)  Hypertensive heart and renal disease with renal failure, stage 1 through stage 4 or unspecified chronic kidney disease, without heart failure -     POCT urinalysis dipstick -     Microalbumin / creatinine urine ratio -     EKG 12-Lead  Pure hypercholesterolemia  Aortic atherosclerosis   Assessment & Plan Type 2 diabetes mellitus with stage 3 chronic kidney disease Kidney function monitored with olmesartan . - Continue olmesartan  for kidney protection. - Ordered routine blood work to monitor diabetes and kidney function.  Hypertensive heart and chronic kidney disease Blood pressure managed with amlodipine , atorvastatin , and hydrochlorothiazide . - Continue current antihypertensive regimen.  Solitary pulmonary nodule Benign biopsy. Follow-up imaging  recommended in six months. - Scheduled CT scan of the chest for February.  Bilateral knee pain Chronic pain, more pronounced in the left knee. Managed with Biofreeze gel and wrapping. - Continue using Biofreeze gel for knee pain. - Encouraged exercises to improve mobility and strength, such as heel raises and seated leg exercises.  Constipation Possibly related to dietary habits.  General Health Maintenance Routine health maintenance discussed. - Encouraged regular exercise during TV commercials. - Ensured adequate fluid intake.     Return for 1 year HM, 4 month dm f/u.SABRA Patient was given opportunity to ask questions. Patient verbalized understanding of the plan and was able to repeat key elements of the plan.  All questions were answered to their satisfaction.   I, Shannon LOISE Slocumb, MD, have reviewed all documentation for this visit. The documentation on 07/13/24 for the exam, diagnosis, procedures, and orders are all accurate and complete.        [1] Social History Tobacco Use  Smoking Status Never  Smokeless Tobacco Never

## 2024-07-13 NOTE — Patient Instructions (Signed)

## 2024-07-14 LAB — CMP14+EGFR
ALT: 13 [IU]/L (ref 0–32)
AST: 26 [IU]/L (ref 0–40)
Albumin: 3.9 g/dL (ref 3.6–4.6)
Alkaline Phosphatase: 82 [IU]/L (ref 48–129)
BUN/Creatinine Ratio: 21 (ref 12–28)
BUN: 36 mg/dL (ref 10–36)
Bilirubin Total: 0.7 mg/dL (ref 0.0–1.2)
CO2: 25 mmol/L (ref 20–29)
Calcium: 9.5 mg/dL (ref 8.7–10.3)
Chloride: 98 mmol/L (ref 96–106)
Creatinine, Ser: 1.69 mg/dL — ABNORMAL HIGH (ref 0.57–1.00)
Globulin, Total: 3.3 g/dL (ref 1.5–4.5)
Glucose: 95 mg/dL (ref 70–99)
Potassium: 4.2 mmol/L (ref 3.5–5.2)
Sodium: 138 mmol/L (ref 134–144)
Total Protein: 7.2 g/dL (ref 6.0–8.5)
eGFR: 28 mL/min/{1.73_m2} — ABNORMAL LOW

## 2024-07-14 LAB — CBC
Hematocrit: 38.1 % (ref 34.0–46.6)
Hemoglobin: 12 g/dL (ref 11.1–15.9)
MCH: 30.3 pg (ref 26.6–33.0)
MCHC: 31.5 g/dL (ref 31.5–35.7)
MCV: 96 fL (ref 79–97)
Platelets: 250 10*3/uL (ref 150–450)
RBC: 3.96 x10E6/uL (ref 3.77–5.28)
RDW: 14 % (ref 11.7–15.4)
WBC: 5.5 10*3/uL (ref 3.4–10.8)

## 2024-07-14 LAB — LIPID PANEL
Chol/HDL Ratio: 2.8 ratio (ref 0.0–4.4)
Cholesterol, Total: 120 mg/dL (ref 100–199)
HDL: 43 mg/dL
LDL Chol Calc (NIH): 58 mg/dL (ref 0–99)
Triglycerides: 103 mg/dL (ref 0–149)
VLDL Cholesterol Cal: 19 mg/dL (ref 5–40)

## 2024-07-14 LAB — HEMOGLOBIN A1C
Est. average glucose Bld gHb Est-mCnc: 120 mg/dL
Hgb A1c MFr Bld: 5.8 % — ABNORMAL HIGH (ref 4.8–5.6)

## 2024-07-14 LAB — MICROALBUMIN / CREATININE URINE RATIO
Creatinine, Urine: 283.1 mg/dL
Microalb/Creat Ratio: 109 mg/g{creat} — ABNORMAL HIGH (ref 0–29)
Microalbumin, Urine: 309.9 ug/mL

## 2024-07-17 ENCOUNTER — Ambulatory Visit: Payer: Self-pay | Admitting: Internal Medicine

## 2024-07-17 DIAGNOSIS — E1122 Type 2 diabetes mellitus with diabetic chronic kidney disease: Secondary | ICD-10-CM

## 2024-07-21 DIAGNOSIS — M170 Bilateral primary osteoarthritis of knee: Secondary | ICD-10-CM | POA: Insufficient documentation

## 2024-07-21 NOTE — Assessment & Plan Note (Signed)
 Chronic, well controlled.  EKG performed, NSR w/ LAFB, voltage criteria for LVH and nonspecific ST depression and T abnormality. Blood pressure managed with amlodipine  and hydrochlorothiazide . - Continue current antihypertensive regimen. - Follow low sodium diet.

## 2024-07-21 NOTE — Assessment & Plan Note (Signed)
 Cholesterol management ongoing with current medication regimen. - Continue current cholesterol-lowering medication, atorvastatin  80mg  daily

## 2024-07-21 NOTE — Assessment & Plan Note (Signed)
 Chronic pain, more pronounced in the left knee. Managed with Biofreeze gel and wrapping. - Continue using Biofreeze gel for knee pain. - Encouraged exercises to improve mobility and strength, such as heel raises and seated leg exercises.

## 2024-07-21 NOTE — Assessment & Plan Note (Signed)
 Chronic, diabetic foot exam was performed.  Diabetes is controlled with lifestyle changes. Not on any meds at this time.  - Chronic, she is encouraged to stay well hydrated, avoid NSAIDs and keep BP controlled to prevent progression of CKD.   - I DISCUSSED WITH THE PATIENT AT LENGTH REGARDING THE GOALS OF GLYCEMIC CONTROL AND POSSIBLE LONG-TERM COMPLICATIONS.  I  ALSO STRESSED THE IMPORTANCE OF COMPLIANCE WITH HOME GLUCOSE MONITORING, DIETARY RESTRICTIONS INCLUDING AVOIDANCE OF SUGARY DRINKS/PROCESSED FOODS,  ALONG WITH REGULAR EXERCISE.  I  ALSO STRESSED THE IMPORTANCE OF ANNUAL EYE EXAMS, SELF FOOT CARE AND COMPLIANCE WITH OFFICE VISITS.

## 2024-07-21 NOTE — Assessment & Plan Note (Signed)
 Atherosclerosis noted in the aorta. Managed with aspirin  and cholesterol medication. - Continue aspirin  therapy. - Continue cholesterol-lowering medication, atorvastatin  80mg  daily

## 2024-07-21 NOTE — Assessment & Plan Note (Signed)

## 2024-07-21 NOTE — Assessment & Plan Note (Addendum)
 Benign biopsy. Follow-up imaging recommended in six months. - Scheduled CT scan of the chest for February.

## 2024-08-01 ENCOUNTER — Other Ambulatory Visit

## 2024-09-21 ENCOUNTER — Ambulatory Visit: Payer: Self-pay

## 2025-01-10 ENCOUNTER — Ambulatory Visit: Payer: Self-pay | Admitting: Internal Medicine

## 2025-07-17 ENCOUNTER — Encounter: Payer: Self-pay | Admitting: Internal Medicine
